# Patient Record
Sex: Female | Born: 1940 | Race: White | Hispanic: No | Smoking: Current every day smoker
Health system: Southern US, Community
[De-identification: ages and names within clinical notes are randomized; demographics above are authoritative.]

## PROBLEM LIST (undated history)

## (undated) DIAGNOSIS — K219 Gastro-esophageal reflux disease without esophagitis: Secondary | ICD-10-CM

## (undated) DIAGNOSIS — I739 Peripheral vascular disease, unspecified: Secondary | ICD-10-CM

## (undated) DIAGNOSIS — M359 Systemic involvement of connective tissue, unspecified: Secondary | ICD-10-CM

## (undated) DIAGNOSIS — I10 Essential (primary) hypertension: Secondary | ICD-10-CM

## (undated) DIAGNOSIS — D689 Coagulation defect, unspecified: Secondary | ICD-10-CM

## (undated) DIAGNOSIS — I824Z9 Acute embolism and thrombosis of unspecified deep veins of unspecified distal lower extremity: Secondary | ICD-10-CM

## (undated) DIAGNOSIS — C159 Malignant neoplasm of esophagus, unspecified: Secondary | ICD-10-CM

## (undated) DIAGNOSIS — J45909 Unspecified asthma, uncomplicated: Secondary | ICD-10-CM

## (undated) DIAGNOSIS — Z872 Personal history of diseases of the skin and subcutaneous tissue: Secondary | ICD-10-CM

## (undated) DIAGNOSIS — C109 Malignant neoplasm of oropharynx, unspecified: Secondary | ICD-10-CM

## (undated) DIAGNOSIS — Z8719 Personal history of other diseases of the digestive system: Secondary | ICD-10-CM

## (undated) DIAGNOSIS — E871 Hypo-osmolality and hyponatremia: Secondary | ICD-10-CM

## (undated) DIAGNOSIS — Z8711 Personal history of peptic ulcer disease: Secondary | ICD-10-CM

## (undated) DIAGNOSIS — D751 Secondary polycythemia: Secondary | ICD-10-CM

## (undated) DIAGNOSIS — N189 Chronic kidney disease, unspecified: Secondary | ICD-10-CM

## (undated) HISTORY — PX: BREAST CYST EXCISION: SHX579

## (undated) HISTORY — DX: Malignant neoplasm of oropharynx, unspecified: C10.9

## (undated) HISTORY — PX: TONSILLECTOMY: SUR1361

## (undated) HISTORY — PX: CATARACT EXTRACTION W/ INTRAOCULAR LENS  IMPLANT, BILATERAL: SHX1307

## (undated) HISTORY — DX: Acute embolism and thrombosis of unspecified deep veins of unspecified distal lower extremity: I82.4Z9

## (undated) HISTORY — DX: Secondary polycythemia: D75.1

## (undated) HISTORY — DX: Peripheral vascular disease, unspecified: I73.9

## (undated) HISTORY — PX: ANTERIOR CERVICAL DECOMP/DISCECTOMY FUSION: SHX1161

## (undated) HISTORY — DX: Chronic kidney disease, unspecified: N18.9

## (undated) HISTORY — DX: Essential (primary) hypertension: I10

## (undated) HISTORY — DX: Personal history of diseases of the skin and subcutaneous tissue: Z87.2

## (undated) HISTORY — PX: PERIPHERAL ARTERIAL STENT GRAFT: SHX2220

## (undated) HISTORY — DX: Malignant neoplasm of esophagus, unspecified: C15.9

## (undated) HISTORY — DX: Unspecified asthma, uncomplicated: J45.909

## (undated) HISTORY — DX: Coagulation defect, unspecified: D68.9

---

## 2004-08-22 ENCOUNTER — Ambulatory Visit: Payer: Self-pay | Admitting: Internal Medicine

## 2005-03-27 ENCOUNTER — Ambulatory Visit: Payer: Self-pay | Admitting: Physician Assistant

## 2005-04-25 ENCOUNTER — Inpatient Hospital Stay (HOSPITAL_COMMUNITY): Admission: RE | Admit: 2005-04-25 | Discharge: 2005-04-26 | Payer: Self-pay | Admitting: Neurosurgery

## 2005-05-15 ENCOUNTER — Encounter: Admission: RE | Admit: 2005-05-15 | Discharge: 2005-05-15 | Payer: Self-pay | Admitting: Neurosurgery

## 2005-08-12 ENCOUNTER — Ambulatory Visit: Payer: Self-pay | Admitting: Ophthalmology

## 2005-10-15 ENCOUNTER — Ambulatory Visit: Payer: Self-pay | Admitting: Internal Medicine

## 2006-05-23 ENCOUNTER — Ambulatory Visit: Payer: Self-pay | Admitting: Neurosurgery

## 2006-09-02 DIAGNOSIS — C109 Malignant neoplasm of oropharynx, unspecified: Secondary | ICD-10-CM

## 2006-09-02 DIAGNOSIS — C159 Malignant neoplasm of esophagus, unspecified: Secondary | ICD-10-CM

## 2006-09-02 HISTORY — DX: Malignant neoplasm of oropharynx, unspecified: C10.9

## 2006-09-02 HISTORY — PX: PORTACATH PLACEMENT: SHX2246

## 2006-09-02 HISTORY — DX: Malignant neoplasm of esophagus, unspecified: C15.9

## 2006-10-30 ENCOUNTER — Ambulatory Visit: Payer: Self-pay | Admitting: Internal Medicine

## 2007-07-04 ENCOUNTER — Ambulatory Visit: Payer: Self-pay | Admitting: Internal Medicine

## 2007-07-13 ENCOUNTER — Ambulatory Visit: Payer: Self-pay | Admitting: Family Medicine

## 2007-07-22 ENCOUNTER — Ambulatory Visit: Payer: Self-pay | Admitting: Internal Medicine

## 2007-07-23 ENCOUNTER — Ambulatory Visit: Payer: Self-pay | Admitting: Internal Medicine

## 2007-07-24 ENCOUNTER — Ambulatory Visit: Payer: Self-pay | Admitting: Vascular Surgery

## 2007-08-03 ENCOUNTER — Ambulatory Visit: Payer: Self-pay | Admitting: Internal Medicine

## 2007-09-03 ENCOUNTER — Ambulatory Visit: Payer: Self-pay | Admitting: Internal Medicine

## 2007-10-04 ENCOUNTER — Ambulatory Visit: Payer: Self-pay | Admitting: Internal Medicine

## 2007-11-01 ENCOUNTER — Ambulatory Visit: Payer: Self-pay | Admitting: Internal Medicine

## 2007-12-02 ENCOUNTER — Ambulatory Visit: Payer: Self-pay | Admitting: Internal Medicine

## 2008-01-01 ENCOUNTER — Ambulatory Visit: Payer: Self-pay | Admitting: Internal Medicine

## 2008-02-01 ENCOUNTER — Ambulatory Visit: Payer: Self-pay | Admitting: Internal Medicine

## 2008-03-02 ENCOUNTER — Ambulatory Visit: Payer: Self-pay | Admitting: Internal Medicine

## 2008-04-02 ENCOUNTER — Ambulatory Visit: Payer: Self-pay | Admitting: Internal Medicine

## 2008-04-04 ENCOUNTER — Ambulatory Visit: Payer: Self-pay | Admitting: Internal Medicine

## 2008-05-03 ENCOUNTER — Ambulatory Visit: Payer: Self-pay | Admitting: Internal Medicine

## 2008-06-02 ENCOUNTER — Ambulatory Visit: Payer: Self-pay | Admitting: Internal Medicine

## 2008-07-03 ENCOUNTER — Ambulatory Visit: Payer: Self-pay | Admitting: Internal Medicine

## 2008-07-18 ENCOUNTER — Ambulatory Visit: Payer: Self-pay | Admitting: Internal Medicine

## 2008-08-02 ENCOUNTER — Ambulatory Visit: Payer: Self-pay | Admitting: Internal Medicine

## 2008-09-02 ENCOUNTER — Ambulatory Visit: Payer: Self-pay | Admitting: Internal Medicine

## 2008-10-03 ENCOUNTER — Ambulatory Visit: Payer: Self-pay | Admitting: Internal Medicine

## 2008-10-31 ENCOUNTER — Ambulatory Visit: Payer: Self-pay | Admitting: Internal Medicine

## 2008-12-01 ENCOUNTER — Ambulatory Visit: Payer: Self-pay | Admitting: Internal Medicine

## 2008-12-31 ENCOUNTER — Ambulatory Visit: Payer: Self-pay | Admitting: Internal Medicine

## 2009-01-31 ENCOUNTER — Ambulatory Visit: Payer: Self-pay | Admitting: Internal Medicine

## 2009-03-02 ENCOUNTER — Ambulatory Visit: Payer: Self-pay | Admitting: Internal Medicine

## 2009-03-07 ENCOUNTER — Ambulatory Visit: Payer: Self-pay | Admitting: Internal Medicine

## 2009-04-02 ENCOUNTER — Ambulatory Visit: Payer: Self-pay | Admitting: Internal Medicine

## 2009-05-03 ENCOUNTER — Ambulatory Visit: Payer: Self-pay | Admitting: Internal Medicine

## 2009-05-31 ENCOUNTER — Ambulatory Visit: Payer: Self-pay | Admitting: Internal Medicine

## 2009-06-02 ENCOUNTER — Ambulatory Visit: Payer: Self-pay | Admitting: Internal Medicine

## 2009-06-12 ENCOUNTER — Ambulatory Visit: Payer: Self-pay | Admitting: Internal Medicine

## 2009-06-26 ENCOUNTER — Ambulatory Visit: Payer: Self-pay | Admitting: Vascular Surgery

## 2009-07-03 ENCOUNTER — Ambulatory Visit: Payer: Self-pay | Admitting: Internal Medicine

## 2009-08-02 ENCOUNTER — Ambulatory Visit: Payer: Self-pay | Admitting: Internal Medicine

## 2009-10-11 ENCOUNTER — Ambulatory Visit: Payer: Self-pay | Admitting: Internal Medicine

## 2009-11-07 ENCOUNTER — Ambulatory Visit: Payer: Self-pay | Admitting: Vascular Surgery

## 2009-12-31 ENCOUNTER — Ambulatory Visit: Payer: Self-pay | Admitting: Internal Medicine

## 2010-01-19 ENCOUNTER — Ambulatory Visit: Payer: Self-pay | Admitting: Internal Medicine

## 2010-01-31 ENCOUNTER — Ambulatory Visit: Payer: Self-pay | Admitting: Internal Medicine

## 2010-03-02 ENCOUNTER — Ambulatory Visit: Payer: Self-pay | Admitting: Internal Medicine

## 2010-04-02 ENCOUNTER — Ambulatory Visit: Payer: Self-pay | Admitting: Internal Medicine

## 2010-05-03 ENCOUNTER — Ambulatory Visit: Payer: Self-pay | Admitting: Internal Medicine

## 2010-06-02 ENCOUNTER — Ambulatory Visit: Payer: Self-pay | Admitting: Internal Medicine

## 2010-07-03 ENCOUNTER — Ambulatory Visit: Payer: Self-pay | Admitting: Internal Medicine

## 2010-08-02 ENCOUNTER — Ambulatory Visit: Payer: Self-pay | Admitting: Internal Medicine

## 2010-09-14 ENCOUNTER — Ambulatory Visit: Payer: Self-pay | Admitting: Internal Medicine

## 2010-10-03 ENCOUNTER — Ambulatory Visit: Payer: Self-pay | Admitting: Internal Medicine

## 2010-11-08 ENCOUNTER — Ambulatory Visit: Payer: Self-pay | Admitting: Internal Medicine

## 2010-11-09 ENCOUNTER — Ambulatory Visit: Payer: Self-pay | Admitting: Internal Medicine

## 2010-12-02 ENCOUNTER — Ambulatory Visit: Payer: Self-pay | Admitting: Internal Medicine

## 2011-01-11 ENCOUNTER — Ambulatory Visit: Payer: Self-pay | Admitting: Internal Medicine

## 2011-02-01 ENCOUNTER — Ambulatory Visit: Payer: Self-pay | Admitting: Internal Medicine

## 2011-02-28 ENCOUNTER — Ambulatory Visit: Payer: Self-pay | Admitting: Gastroenterology

## 2011-03-07 LAB — PATHOLOGY REPORT

## 2011-03-21 ENCOUNTER — Ambulatory Visit: Payer: Self-pay | Admitting: Otolaryngology

## 2011-05-13 ENCOUNTER — Ambulatory Visit: Payer: Self-pay | Admitting: Internal Medicine

## 2011-06-28 ENCOUNTER — Ambulatory Visit: Payer: Self-pay | Admitting: Internal Medicine

## 2011-07-04 ENCOUNTER — Ambulatory Visit: Payer: Self-pay | Admitting: Internal Medicine

## 2011-12-03 ENCOUNTER — Ambulatory Visit: Payer: Self-pay | Admitting: Internal Medicine

## 2011-12-30 ENCOUNTER — Ambulatory Visit: Payer: Self-pay | Admitting: Gastroenterology

## 2012-02-04 ENCOUNTER — Ambulatory Visit: Payer: Self-pay | Admitting: Gastroenterology

## 2012-02-07 ENCOUNTER — Ambulatory Visit: Payer: Self-pay | Admitting: Internal Medicine

## 2012-02-07 LAB — HEPATIC FUNCTION PANEL A (ARMC)
Albumin: 4 g/dL (ref 3.4–5.0)
Alkaline Phosphatase: 100 U/L (ref 50–136)
Bilirubin, Direct: 0.2 mg/dL (ref 0.00–0.20)
Bilirubin,Total: 0.7 mg/dL (ref 0.2–1.0)
SGOT(AST): 23 U/L (ref 15–37)
SGPT (ALT): 22 U/L

## 2012-02-07 LAB — BASIC METABOLIC PANEL
Anion Gap: 11 (ref 7–16)
BUN: 8 mg/dL (ref 7–18)
Calcium, Total: 9.8 mg/dL (ref 8.5–10.1)
Creatinine: 0.77 mg/dL (ref 0.60–1.30)
EGFR (African American): >60
Glucose: 114 mg/dL — ABNORMAL HIGH (ref 65–99)

## 2012-02-07 LAB — CBC CANCER CENTER
Eosinophil #: 0.1 x10 3/mm (ref 0.0–0.7)
Eosinophil %: 0.9 %
HCT: 51.6 % — ABNORMAL HIGH (ref 35.0–47.0)
Lymphocyte #: 1.2 x10 3/mm (ref 1.0–3.6)
MCH: 34.5 pg — ABNORMAL HIGH (ref 26.0–34.0)
MCHC: 33.5 g/dL (ref 32.0–36.0)
MCV: 103 fL — ABNORMAL HIGH (ref 80–100)
Neutrophil #: 4.7 x10 3/mm (ref 1.4–6.5)
Neutrophil %: 70.6 %
RDW: 14.3 % (ref 11.5–14.5)
WBC: 6.7 x10 3/mm (ref 3.6–11.0)

## 2012-02-11 ENCOUNTER — Ambulatory Visit: Payer: Self-pay | Admitting: Gastroenterology

## 2012-02-12 LAB — CANCER CENTER HEMATOCRIT: HCT: 50.4 % — ABNORMAL HIGH (ref 35.0–47.0)

## 2012-02-12 LAB — POTASSIUM: Potassium: 2.9 mmol/L — ABNORMAL LOW (ref 3.5–5.1)

## 2012-03-02 ENCOUNTER — Ambulatory Visit: Payer: Self-pay | Admitting: Internal Medicine

## 2012-03-11 LAB — POTASSIUM: Potassium: 3.4 mmol/L — ABNORMAL LOW (ref 3.5–5.1)

## 2012-03-11 LAB — CANCER CENTER HEMATOCRIT: HCT: 43.8 % (ref 35.0–47.0)

## 2012-03-25 LAB — CANCER CENTER HEMATOCRIT: HCT: 42.9 % (ref 35.0–47.0)

## 2012-04-02 ENCOUNTER — Ambulatory Visit: Payer: Self-pay | Admitting: Internal Medicine

## 2012-04-08 LAB — CANCER CENTER HEMATOCRIT: HCT: 44.3 % (ref 35.0–47.0)

## 2012-04-15 LAB — CANCER CENTER HEMATOCRIT: HCT: 43.2 % (ref 35.0–47.0)

## 2012-04-29 LAB — CREATININE, SERUM
Creatinine: 0.98 mg/dL (ref 0.60–1.30)
EGFR (African American): >60

## 2012-04-29 LAB — CBC CANCER CENTER
Basophil %: 0.7 %
Eosinophil #: 0.1 x10 3/mm (ref 0.0–0.7)
Eosinophil %: 1.1 %
HCT: 45.2 % (ref 35.0–47.0)
HGB: 14.5 g/dL (ref 12.0–16.0)
Lymphocyte #: 1.2 x10 3/mm (ref 1.0–3.6)
Lymphocyte %: 12.2 %
MCHC: 32.1 g/dL (ref 32.0–36.0)
MCV: 100 fL (ref 80–100)
Monocyte %: 9.1 %
Neutrophil #: 7.4 x10 3/mm — ABNORMAL HIGH (ref 1.4–6.5)
RBC: 4.52 10*6/uL (ref 3.80–5.20)
RDW: 15.9 % — ABNORMAL HIGH (ref 11.5–14.5)

## 2012-05-03 ENCOUNTER — Ambulatory Visit: Payer: Self-pay | Admitting: Internal Medicine

## 2012-05-07 ENCOUNTER — Encounter: Payer: Self-pay | Admitting: Cardiovascular Disease

## 2012-05-08 ENCOUNTER — Encounter: Payer: Self-pay | Admitting: Cardiovascular Disease

## 2012-05-08 ENCOUNTER — Ambulatory Visit (INDEPENDENT_AMBULATORY_CARE_PROVIDER_SITE_OTHER): Payer: Medicare Other | Admitting: Cardiovascular Disease

## 2012-05-08 VITALS — BP 150/60 | HR 71 | Ht 62.0 in | Wt 111.5 lb

## 2012-05-08 DIAGNOSIS — I10 Essential (primary) hypertension: Secondary | ICD-10-CM

## 2012-05-08 DIAGNOSIS — I739 Peripheral vascular disease, unspecified: Secondary | ICD-10-CM | POA: Insufficient documentation

## 2012-05-08 DIAGNOSIS — R0602 Shortness of breath: Secondary | ICD-10-CM | POA: Insufficient documentation

## 2012-05-08 NOTE — Progress Notes (Signed)
HPI  This is a 71 year old female who is here today for evaluation of dyspnea lower extremity edema. She is not aware of any previous cardiac history. 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 Earnestine Leys. There is no history of myocardial infarction, congestive heart failure or stroke. Since July, he started experiencing lower extremity edema which is worse on the left side. She also noted increased dyspnea especially in the morning with no orthopnea or PND. She still smokes 4-5 cigarettes a day. She smoked for 50 years but according to her she was never a heavy smoker. She denies any chest pain or discomfort. There is no palpitations, dizziness or syncope. She had recurrent lower extremity edema in the past and is almost always worse on the left side. She reports having a venous ultrasound in the lower extremity which showed no DVT. She also had been on amlodipine for hypertension and did notice that the swelling in her legs got worse after she was started on this medication. She reports bilateral calf discomfort with walking but current symptoms are much better than he for femoral stenting in 2010. She has not followed up with vascular surgery since then.  Allergies  Allergen Reactions  . Ceftin (Cefuroxime Axetil)   . Trimethobenzamide      Current Outpatient Prescriptions on File Prior to Visit  Medication Sig Dispense Refill  . amLODipine (NORVASC) 10 MG tablet Take 10 mg by mouth daily.      Marland Kitchen aspirin EC 81 MG tablet Take 81 mg by mouth daily.      . calcium carbonate (OS-CAL) 600 MG TABS Take 600 mg by mouth 3 (three) times daily.      . hydrochlorothiazide (HYDRODIURIL) 25 MG tablet Take 25 mg by mouth daily.      Marland Kitchen ipratropium (ATROVENT) 0.06 % nasal spray Place  2 sprays into the nose as needed.       . metoprolol succinate (TOPROL-XL) 25 MG 24 hr tablet Take 25 mg by mouth daily.      . pantoprazole (PROTONIX) 40 MG tablet Take 40 mg by mouth daily.      . potassium chloride SA (K-DUR,KLOR-CON) 20 MEQ tablet Take 20 mEq by mouth daily.         Past Medical History  Diagnosis Date  . Asthma   . Osteoporosis   . COPD (chronic obstructive pulmonary disease)   . Peripheral vascular disease   . Patient on combined chemotherapy and radiation 2008  . Hypertension   . History of skin ulcer   . Squamous cell carcinoma of oropharynx     right  . Cancer of esophagus      Past Surgical History  Procedure Date  . Breast biopsy     negative  . Peripheral arterial stent graft     x2 STENTS BOTH LEGS  . Neck surgery      History reviewed. No pertinent family history.   History   Social History  . Marital Status: Married    Spouse Name: N/A    Number of Children: N/A  . Years of Education: N/A   Occupational History  . Not on file.   Social History Main Topics  . Smoking status: Current Everyday Smoker -- 0.2 packs/day for 50 years    Types: Cigarettes  .  Smokeless tobacco: Not on file  . Alcohol Use: 0.6 oz/week    1 Glasses of wine per week  . Drug Use: No  . Sexually Active:    Other Topics Concern  . Not on file   Social History Narrative  . No narrative on file     ROS Constitutional: Negative for fever, chills, diaphoresis, activity change, appetite change and fatigue.  HENT: Negative for hearing loss, nosebleeds, congestion, sore throat, facial swelling, drooling, trouble swallowing, neck pain, voice change, sinus pressure and tinnitus.  Eyes: Negative for photophobia, pain, discharge and visual disturbance.  Respiratory: Negative for apnea, cough, chest tightness, shortness of breath and wheezing.  Cardiovascular: Negative for chest pain, palpitations .  Gastrointestinal: Negative for nausea, vomiting, abdominal  pain, diarrhea, constipation, blood in stool and abdominal distention.  Genitourinary: Negative for dysuria, urgency, frequency, hematuria and decreased urine volume.  Musculoskeletal: Negative for myalgias, back pain, joint swelling, arthralgias and gait problem.  Skin: Negative for color change, pallor, rash and wound.  Neurological: Negative for dizziness, tremors, seizures, syncope, speech difficulty, weakness, light-headedness, numbness and headaches.  Psychiatric/Behavioral: Negative for suicidal ideas, hallucinations, behavioral problems and agitation. The patient is not nervous/anxious.     PHYSICAL EXAM   BP 150/60  Pulse 71  Ht 5\' 2"  (1.575 m)  Wt 111 lb 8 oz (50.576 kg)  BMI 20.39 kg/m2 Constitutional: She is oriented to person, place, and time. She appears well-developed and well-nourished. No distress.  HENT: No nasal discharge.  Head: Normocephalic and atraumatic.  Eyes: Pupils are equal and round. Right eye exhibits no discharge. Left eye exhibits no discharge.  Neck: Normal range of motion. Neck supple. No JVD present. No thyromegaly present.  Cardiovascular: Normal rate, regular rhythm, normal heart sounds. Exam reveals no gallop and no friction rub. There is a 1/6 systolic ejection murmur at the aortic area.  Pulmonary/Chest: Effort normal and breath sounds normal. No stridor. No respiratory distress. She has no wheezes. She has no rales. She exhibits no tenderness.  Abdominal: Soft. Bowel sounds are normal. She exhibits no distension. There is no tenderness. There is no rebound and no guarding.  Musculoskeletal: Normal range of motion. She exhibits +1 edema worse on the left side and no tenderness.  Neurological: She is alert and oriented to person, place, and time. Coordination normal.  Skin: Skin is warm and dry. No rash noted. She is not diaphoretic. No erythema. No pallor.  Psychiatric: She has a normal mood and affect. Her behavior is normal. Judgment and thought  content normal.     EKG: Sinus  Rhythm  -Incomplete right bundle branch block and left axis -anterior fascicular block.   -  Nonspecific T-abnormality.    ASSESSMENT AND PLAN

## 2012-05-08 NOTE — Patient Instructions (Addendum)
Your physician has requested that you have a lexiscan myoview. For further information please visit [email or web address] Please follow instruction sheet, as given.  Your physician has requested that you have an echocardiogram. Echocardiography is a painless test that uses sound waves to create images of your heart. It provides your doctor with information about the size and shape of your heart and how well your heart's chambers and valves are working. This procedure takes approximately one hour. There are no restrictions for this procedure.  Your physician has requested that you have a lower extremity arterial exercise duplex. During this test, exercise and ultrasound are used to evaluate arterial blood flow in the legs. Allow one hour for this exam. There are no restrictions or special instructions.  Follow up after tests.

## 2012-05-08 NOTE — Assessment & Plan Note (Signed)
The patient had  previous femoral stenting done in 2010 with no followup since then. Current symptoms include mild claudication bilaterally. I recommend lower extremity arterial duplex ultrasound with ABI which will be ordered.

## 2012-05-08 NOTE — Assessment & Plan Note (Signed)
The patient is having increased dyspnea since July. She does have known history of COPD and she continues to smoke. It is possible that her dyspnea might be related to this. However, she had previous radiation and chemotherapy done and thus she is at risk of cardiomyopathy. She also has multiple risk factors for coronary artery disease. Although, she is not reporting chest pain, angina equivalent will need to be evaluated. Thus, I recommend evaluation with an echocardiogram as well as a pharmacologic nuclear stress test. The patient is not able to exercise on a treadmill and also had an abnormal ECG.

## 2012-05-08 NOTE — Assessment & Plan Note (Signed)
The patient is having lower extremity edema which might be related to chronic venous insufficiency, a cardiac etiology or treatment with amlodipine. If her cardiac workup is unremarkable, I will consider switching amlodipine to a different antihypertensive medication.

## 2012-05-18 ENCOUNTER — Other Ambulatory Visit (INDEPENDENT_AMBULATORY_CARE_PROVIDER_SITE_OTHER): Payer: Medicare Other

## 2012-05-18 ENCOUNTER — Other Ambulatory Visit: Payer: Self-pay

## 2012-05-18 DIAGNOSIS — R0602 Shortness of breath: Secondary | ICD-10-CM

## 2012-05-22 ENCOUNTER — Ambulatory Visit: Payer: Self-pay | Admitting: Cardiovascular Disease

## 2012-05-22 ENCOUNTER — Telehealth: Payer: Self-pay

## 2012-05-22 DIAGNOSIS — R0602 Shortness of breath: Secondary | ICD-10-CM

## 2012-05-22 NOTE — Telephone Encounter (Signed)
Message copied by Marcelle Overlie on Fri May 22, 2012  4:30 PM ------      Message from: Lorine Bears A      Created: Fri May 22, 2012  4:17 PM      Regarding: test       Normal stress test today.

## 2012-05-26 ENCOUNTER — Encounter (INDEPENDENT_AMBULATORY_CARE_PROVIDER_SITE_OTHER): Payer: Medicare Other

## 2012-05-26 ENCOUNTER — Other Ambulatory Visit: Payer: Self-pay | Admitting: Cardiovascular Disease

## 2012-05-26 ENCOUNTER — Other Ambulatory Visit: Payer: Self-pay | Admitting: Cardiology

## 2012-05-26 DIAGNOSIS — I739 Peripheral vascular disease, unspecified: Secondary | ICD-10-CM

## 2012-05-26 DIAGNOSIS — R0602 Shortness of breath: Secondary | ICD-10-CM

## 2012-05-26 DIAGNOSIS — I700 Atherosclerosis of aorta: Secondary | ICD-10-CM

## 2012-05-27 NOTE — Telephone Encounter (Signed)
Patient notified per Dr. Kirke Corin stress test is normal.

## 2012-06-01 ENCOUNTER — Ambulatory Visit (INDEPENDENT_AMBULATORY_CARE_PROVIDER_SITE_OTHER): Payer: Medicare Other | Admitting: Cardiovascular Disease

## 2012-06-01 ENCOUNTER — Encounter: Payer: Self-pay | Admitting: Cardiovascular Disease

## 2012-06-01 VITALS — BP 140/62 | HR 80 | Ht 62.0 in | Wt 109.2 lb

## 2012-06-01 DIAGNOSIS — I10 Essential (primary) hypertension: Secondary | ICD-10-CM

## 2012-06-01 DIAGNOSIS — R0602 Shortness of breath: Secondary | ICD-10-CM

## 2012-06-01 DIAGNOSIS — I739 Peripheral vascular disease, unspecified: Secondary | ICD-10-CM

## 2012-06-01 MED ORDER — LOSARTAN POTASSIUM-HCTZ 100-25 MG PO TABS: 1.0000 | ORAL_TABLET | Freq: Every day | ORAL | Status: DC

## 2012-06-01 NOTE — Assessment & Plan Note (Signed)
Status post iliac stenting in 2010. She had an ABI done which was moderately reduced on the right side. Aortoiliac duplex showed patent stents without significant restenosis. I will continue to monitor these on a yearly basis.

## 2012-06-01 NOTE — Patient Instructions (Addendum)
Stop Amlodipine.  Stop Hydrochlorothiazide.  Start Hyzaar (Losartan- Hydrochlorothiazide) 100/25 mg once daily.   Labs in 1 week.  Follow up in 6 months.

## 2012-06-01 NOTE — Assessment & Plan Note (Signed)
Her blood pressure is reasonably controlled. However, she is having increased lower extremity edema which is likely due to amlodipine. Thus, I will switch her from amlodipine to losartan. Given that she is already on hydrochlorothiazide, I will switch that  to Hyzaar 100/25 mg once daily. I will check basic metabolic profile in one week. If potassium level is reasonable, K-Dur can likely be discontinued.

## 2012-06-01 NOTE — Assessment & Plan Note (Signed)
This does not seem to be cardiac. It's likely due to COPD. She underwent cardiac evaluation which included a nuclear stress test which showed no evidence of ischemia with normal ejection fraction. She had an echocardiogram done which showed normal LV systolic function, mild grade 1 diastolic dysfunction, mild mitral regurgitation and borderline pulmonary hypertension. I advised the patient to quit smoking.

## 2012-06-01 NOTE — Progress Notes (Signed)
HPI  This is a 71 year old female who is here today for a followup visit regarding dyspnea and lower extremity edema. She is not aware of any previous cardiac history. 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 Earnestine Leys. There is no history of myocardial infarction, congestive heart failure or stroke. Since July, he started experiencing lower extremity edema which is worse on the left side. She also noted increased dyspnea especially in the morning with no orthopnea or PND. She still smokes 4-5 cigarettes a day. She smoked for 50 years but according to her she was never a heavy smoker. She denies any chest pain or discomfort. There is no palpitations, dizziness or syncope. She had recurrent lower extremity edema in the past and is almost always worse on the left side. She reports having a venous ultrasound in the lower extremity which showed no DVT. She also had been on amlodipine for hypertension and did notice that the swelling in her legs got worse after she was started on this medication.  She reports bilateral calf discomfort with walking but current symptoms are much better than he for femoral stenting in 2010.    Allergies  Allergen Reactions  . Ceftin (Cefuroxime Axetil)   . Trimethobenzamide      Current Outpatient Prescriptions on File Prior to Visit  Medication Sig Dispense Refill  . aspirin EC 81 MG tablet Take 81 mg by mouth daily.      . calcium carbonate (OS-CAL) 600 MG TABS Take 600 mg by mouth 3 (three) times daily.      . Glucosamine-Chondroit-Vit C-Mn (GLUCOSAMINE CHONDR 1500 COMPLX PO) Take by mouth daily.      Marland Kitchen ipratropium (ATROVENT) 0.06 % nasal spray Place 2 sprays into the nose as needed.       . metoprolol succinate (TOPROL-XL) 25 MG 24 hr tablet  Take 25 mg by mouth daily.      . Multiple Vitamin (MULTIVITAMIN) tablet Take 1 tablet by mouth daily.      . pantoprazole (PROTONIX) 40 MG tablet Take 40 mg by mouth daily.      . potassium chloride SA (K-DUR,KLOR-CON) 20 MEQ tablet Take 20 mEq by mouth daily.      Marland Kitchen losartan-hydrochlorothiazide (HYZAAR) 100-25 MG per tablet Take 1 tablet by mouth daily.  30 tablet  6     Past Medical History  Diagnosis Date  . Asthma   . Osteoporosis   . COPD (chronic obstructive pulmonary disease)   . Peripheral vascular disease   . Patient on combined chemotherapy and radiation 2008  . History of skin ulcer   . Squamous cell carcinoma of oropharynx     right  . Cancer of esophagus   . Hypertension      Past Surgical History  Procedure Date  . Breast biopsy     negative  . Peripheral arterial stent graft     x2 STENTS BOTH LEGS  . Neck surgery      History reviewed. No pertinent family history.   History   Social History  . Marital Status: Married    Spouse Name: N/A    Number of Children: N/A  . Years of Education: N/A   Occupational History  . Not on file.   Social History Main Topics  . Smoking status: Current  Every Day Smoker -- 0.2 packs/day for 50 years    Types: Cigarettes  . Smokeless tobacco: Not on file  . Alcohol Use: 0.6 oz/week    1 Glasses of wine per week  . Drug Use: No  . Sexually Active:    Other Topics Concern  . Not on file   Social History Narrative  . No narrative on file      PHYSICAL EXAM   BP 140/62  Pulse 80  Ht 5\' 2"  (1.575 m)  Wt 109 lb 4 oz (49.555 kg)  BMI 19.98 kg/m2 Constitutional: She is oriented to person, place, and time. She appears well-developed and well-nourished. No distress.  HENT: No nasal discharge.  Head: Normocephalic and atraumatic.  Eyes: Pupils are equal and round. Right eye exhibits no discharge. Left eye exhibits no discharge.  Neck: Normal range of motion. Neck supple. No JVD present. No thyromegaly  present.  Cardiovascular: Normal rate, regular rhythm, normal heart sounds. Exam reveals no gallop and no friction rub. No murmur heard.  Pulmonary/Chest: Effort normal and breath sounds normal. No stridor. No respiratory distress. She has no wheezes. She has no rales. She exhibits no tenderness.  Abdominal: Soft. Bowel sounds are normal. She exhibits no distension. There is no tenderness. There is no rebound and no guarding.  Musculoskeletal: Normal range of motion. She exhibits +1 edema and no tenderness.  Neurological: She is alert and oriented to person, place, and time. Coordination normal.  Skin: Skin is warm and dry. No rash noted. She is not diaphoretic. No erythema. No pallor.  Psychiatric: She has a normal mood and affect. Her behavior is normal. Judgment and thought content normal.      ASSESSMENT AND PLAN

## 2012-06-08 ENCOUNTER — Ambulatory Visit (INDEPENDENT_AMBULATORY_CARE_PROVIDER_SITE_OTHER): Payer: Medicare Other

## 2012-06-08 DIAGNOSIS — I10 Essential (primary) hypertension: Secondary | ICD-10-CM

## 2012-06-09 LAB — BASIC METABOLIC PANEL
BUN: 13 mg/dL (ref 8–27)
Glucose: 100 mg/dL — ABNORMAL HIGH (ref 65–99)

## 2012-06-15 ENCOUNTER — Telehealth: Payer: Self-pay | Admitting: Cardiovascular Disease

## 2012-06-15 NOTE — Telephone Encounter (Signed)
Pt states her blood pressure medicine was changed on 06/01/12.  Pt states her blood pressure is going up gradually.  She thinks the change has something to do with it.  Please call pt to advise.

## 2012-06-16 ENCOUNTER — Other Ambulatory Visit: Payer: Self-pay

## 2012-06-16 NOTE — Telephone Encounter (Signed)
Pt says since med changes at last OV ( stopping amlodipine and adding Hyzaar) her BP has not been well controlled.  It has been as follows: 169/65, 194/71, 170's systolic. She says she has been so worried that she took amlodipine yesterday and today. BP today still 170 systolic.  I told her I would let Dr. Kirke Corin know, but advised her, until I am able to get a response, to try taking HCTZ 25 mg tonight (has this at home) then in am resume amlodipine 10 mg and HCTZ 25 mg qd.  She understands to hold Hyzaar. i will call her back with Dr. Jari Sportsman response. Understanding verb.

## 2012-06-16 NOTE — Telephone Encounter (Signed)
Continue Hyzaar. Resume Amlodipine but at a smaller dose (5 mg once daily).

## 2012-06-16 NOTE — Telephone Encounter (Signed)
LMTCB

## 2012-06-16 NOTE — Telephone Encounter (Signed)
Please see below and advise. thanks 

## 2012-06-17 NOTE — Telephone Encounter (Signed)
Pt informed Understanding verb 

## 2012-06-24 ENCOUNTER — Ambulatory Visit: Payer: Self-pay | Admitting: Internal Medicine

## 2012-06-29 ENCOUNTER — Telehealth: Payer: Self-pay | Admitting: Cardiovascular Disease

## 2012-06-29 NOTE — Telephone Encounter (Signed)
Called patient back. She states that she restarted the Norvasc at 5mg  every day and now she is having leg swelling, left greater than right, along with facial swelling. She is also currently taking Hyzaar100/25 and Metoprolol XL 25mg  every day. BP last night on all meds was 139/63. She did not record her heart rate. Advised to Stop the Norvasc and will discuss with Dr.Arida plan for BP control and call her back.

## 2012-06-29 NOTE — Telephone Encounter (Signed)
Discussed above with Dr.Arida and he advised that patient stay off Norvasc. Record BP and heart rates 2 times per day for the next 3 days and call back with the readings and he will made adjustments to her BP medication if necessary.

## 2012-06-29 NOTE — Telephone Encounter (Signed)
Pt calling due to a medication adjustment. Pt is having face and leg swelling.

## 2012-07-02 ENCOUNTER — Other Ambulatory Visit: Payer: Self-pay

## 2012-07-02 MED ORDER — CARVEDILOL 6.25 MG PO TABS: 6.2500 mg | ORAL_TABLET | Freq: Two times a day (BID) | ORAL | Status: DC

## 2012-07-02 NOTE — Telephone Encounter (Signed)
Pt informed Understanding verb New rx sent to pharm 

## 2012-07-02 NOTE — Telephone Encounter (Signed)
See BP readings below and advise thanks

## 2012-07-02 NOTE — Telephone Encounter (Signed)
Pt calling with BP readings  10/29 10:48 am 123/61 p 80 10/29 8:14 pm 173/57 p 69 10/29 11:35 pm 154/60 p69  10/30 10:56 am 188/69 p66 10/30 1:56 pm 186/71 p83 10/30 8:57 pm 181/71 p83 10/30 12:03 am 150/61 p69  10/31 9:43 am 184/75 p78 10/31 3:11 pm 162/64 p80

## 2012-07-02 NOTE — Telephone Encounter (Signed)
Stop Toprol. Start Coreg 6.25 mg bid. Continue to monitor BP.

## 2012-07-03 ENCOUNTER — Ambulatory Visit: Payer: Self-pay | Admitting: Internal Medicine

## 2012-07-03 NOTE — Telephone Encounter (Signed)
Please see below as to what I told pt

## 2012-07-03 NOTE — Telephone Encounter (Signed)
Pt calling states that new med she was prescribed says not to take if pt has asthma.pt does have asthma

## 2012-07-03 NOTE — Telephone Encounter (Signed)
I called pt and explained coreg is a betablocker, as was the Toprol. If pt tolerated Toprol ok, then she should be able to tolerate the coreg.  understanding verb.  She will let us know should she begin to notice worsening sob.

## 2012-07-05 NOTE — Telephone Encounter (Signed)
Great.  Thanks

## 2012-07-09 ENCOUNTER — Other Ambulatory Visit: Payer: Self-pay

## 2012-07-09 MED ORDER — DILTIAZEM HCL ER COATED BEADS 120 MG PO CP24: 120.0000 mg | ORAL_CAPSULE | Freq: Every day | ORAL | Status: DC

## 2012-07-09 NOTE — Telephone Encounter (Signed)
Pt started coreg at our request Bp has been as follows: 11/5 am=189/74, 81        Pm=134/56 11/6 am=162/65, 70         PM=137/58 This am=185/66  Held coreg this am d/t DOE "getting worse"; has had to use inhaler more than usual, feels this is d/t coreg I told her I would discuss with Dr. Kirke Corin and call her back Understanding verb. Continues to take Hyzaar as prescribed

## 2012-07-09 NOTE — Telephone Encounter (Signed)
Stop Coreg. Start Diltiazem ER 120 mg once daily.

## 2012-07-09 NOTE — Telephone Encounter (Signed)
Pt informed Understanding verb New RX sent to pharm  

## 2012-07-09 NOTE — Telephone Encounter (Signed)
Pt calling stating that she thinks her carvedilol is causing her to have SOB. Wants to speak to nurse

## 2012-07-20 NOTE — Telephone Encounter (Signed)
Pt calling states her pulse has been running high today. Wants to speak to nurse

## 2012-07-21 NOTE — Telephone Encounter (Signed)
Increase Diltilazem ER to 180 mg once daily.

## 2012-07-21 NOTE — Telephone Encounter (Signed)
141/61, HR=72 last night 151/66, 128/54, 158/72, 178/70, 171/65, 152/62, 158/64 HR yesterday stayed around 104 BPM Pt says she was asymptomatic Confirms compliance with diltiazem CD I explained I would inform Dr. Kirke Corin and will call her back Understanding verb

## 2012-07-22 ENCOUNTER — Other Ambulatory Visit: Payer: Self-pay

## 2012-07-22 MED ORDER — DILTIAZEM HCL ER COATED BEADS 180 MG PO CP24: 180.0000 mg | ORAL_CAPSULE | Freq: Every day | ORAL | Status: DC

## 2012-07-22 NOTE — Telephone Encounter (Signed)
Pt informed Understanding verb New RX sent to pharm  

## 2012-08-04 ENCOUNTER — Ambulatory Visit (INDEPENDENT_AMBULATORY_CARE_PROVIDER_SITE_OTHER): Payer: Medicare Other | Admitting: Cardiovascular Disease

## 2012-08-04 ENCOUNTER — Encounter: Payer: Self-pay | Admitting: Cardiovascular Disease

## 2012-08-04 VITALS — BP 138/72 | HR 65 | Ht 62.0 in | Wt 110.8 lb

## 2012-08-04 DIAGNOSIS — I10 Essential (primary) hypertension: Secondary | ICD-10-CM

## 2012-08-04 DIAGNOSIS — I739 Peripheral vascular disease, unspecified: Secondary | ICD-10-CM

## 2012-08-04 DIAGNOSIS — R0602 Shortness of breath: Secondary | ICD-10-CM

## 2012-08-04 MED ORDER — DILTIAZEM HCL ER COATED BEADS 240 MG PO CP24: 240.0000 mg | ORAL_CAPSULE | Freq: Every day | ORAL | Status: DC

## 2012-08-04 NOTE — Progress Notes (Signed)
HPI  This is a 71 year old female who is here today for a followup visit regarding hypertension. She has no previous cardiac history. 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 Earnestine Leys. Since July, he started experiencing lower extremity edema which was worse on the left side associated with increased dyspnea especially in the morning with no orthopnea or PND.  She underwent cardiac evaluation which included a nuclear stress test which showed no evidence of ischemia. Echocardiogram showed normal LV systolic function with mild diastolic dysfunction and borderline pulmonary hypertension. It was felt that her dyspnea was due to COPD. Lower extremity edema was thought to be due to amlodipine which was discontinued and she was switched to Hyzaar. The patient called our office subsequently with elevated blood pressure on maximum dose Hyzaar. Amlodipine was resumed but at a lower dose. However, she again did not tolerate it due to lower extremity edema. Amlodipine was stopped and I decided to switch metoprolol to carvedilol for better blood pressure control. However, she did not tolerate the medication due to worsening dyspnea. I decided to start her on diltiazem extended release which thus far has tolerated. Blood pressure has improved and overall she feels better. Home blood pressure readings are still elevated nonetheless.  Allergies  Allergen Reactions  . Ceftin (Cefuroxime Axetil)   . Trimethobenzamide      Current Outpatient Prescriptions on File Prior to Visit  Medication Sig Dispense Refill  . aspirin EC 81 MG tablet Take 81 mg by mouth daily.      . calcium carbonate (OS-CAL) 600 MG TABS Take 600 mg by mouth 3 (three) times daily.      . Glucosamine-Chondroit-Vit C-Mn  (GLUCOSAMINE CHONDR 1500 COMPLX PO) Take by mouth daily.      Marland Kitchen ipratropium (ATROVENT) 0.06 % nasal spray Place 2 sprays into the nose as needed.       Marland Kitchen losartan-hydrochlorothiazide (HYZAAR) 100-25 MG per tablet Take 1 tablet by mouth daily.  30 tablet  6  . Multiple Vitamin (MULTIVITAMIN) tablet Take 1 tablet by mouth daily.      . pantoprazole (PROTONIX) 40 MG tablet Take 40 mg by mouth daily.         Past Medical History  Diagnosis Date  . Asthma   . Osteoporosis   . COPD (chronic obstructive pulmonary disease)   . Peripheral vascular disease   . Patient on combined chemotherapy and radiation 2008  . History of skin ulcer   . Squamous cell carcinoma of oropharynx     right  . Cancer of esophagus   . Hypertension      Past Surgical History  Procedure Date  . Breast biopsy     negative  . Peripheral arterial stent graft     x2 STENTS BOTH LEGS  . Neck surgery      History reviewed. No pertinent family history.   History   Social History  . Marital Status: Married    Spouse Name: N/A    Number of Children: N/A  . Years of Education: N/A   Occupational History  . Not on file.   Social History Main Topics  . Smoking status: Current Every Day Smoker -- 0.2 packs/day for 50 years    Types: Cigarettes  . Smokeless tobacco: Not on file  . Alcohol  Use: 0.6 oz/week    1 Glasses of wine per week  . Drug Use: No  . Sexually Active:    Other Topics Concern  . Not on file   Social History Narrative  . No narrative on file      PHYSICAL EXAM   BP 138/72  Pulse 65  Ht 5\' 2"  (1.575 m)  Wt 110 lb 12 oz (50.236 kg)  BMI 20.26 kg/m2 Constitutional: She is oriented to person, place, and time. She appears well-developed and well-nourished. No distress.  HENT: No nasal discharge.  Head: Normocephalic and atraumatic.  Eyes: Pupils are equal and round. Right eye exhibits no discharge. Left eye exhibits no discharge.  Neck: Normal range of motion. Neck supple. No  JVD present. No thyromegaly present.  Cardiovascular: Normal rate, regular rhythm, normal heart sounds. Exam reveals no gallop and no friction rub. No murmur heard.  Pulmonary/Chest: Effort normal and breath sounds normal. No stridor. No respiratory distress. She has no wheezes. She has no rales. She exhibits no tenderness.  Abdominal: Soft. Bowel sounds are normal. She exhibits no distension. There is no tenderness. There is no rebound and no guarding.  Musculoskeletal: Normal range of motion. She exhibits +1 edema and no tenderness.  Neurological: She is alert and oriented to person, place, and time. Coordination normal.  Skin: Skin is warm and dry. No rash noted. She is not diaphoretic. No erythema. No pallor.  Psychiatric: She has a normal mood and affect. Her behavior is normal. Judgment and thought content normal.   YNW:GNFAO  Rhythm  -Right atrial enlargement.   -Poor R-wave progression.   -  Nonspecific T-abnormality.   Right atrial abnormality and rotation -possible pulmonary disease.   ABNORMAL    ASSESSMENT AND PLAN

## 2012-08-04 NOTE — Assessment & Plan Note (Signed)
Status post iliac stenting in 2010. She had an ABI done which was moderately reduced on the right side. Aortoiliac duplex showed patent stents without significant restenosis. Previous angiogram showed moderate SFA disease. I will continue to monitor these on a yearly basis.

## 2012-08-04 NOTE — Assessment & Plan Note (Signed)
The patient has intolerance to multiple medications including amlodipine and carvedilol. She is tolerating Hyzaar and diltiazem but blood pressure is still above target. I checked her blood pressure manually in both arms and it was 158/70. Thus, I will increase the dose of diltiazem 240 mg once daily. If blood pressure remains elevated, I will consider adding a small dose spironolactone as long as there are no issues with hyperkalemia.

## 2012-08-04 NOTE — Patient Instructions (Addendum)
Increase Diltiazem ER to 240 mg once daily.  Follow up in 3 months.

## 2012-08-19 ENCOUNTER — Ambulatory Visit: Payer: Self-pay | Admitting: Internal Medicine

## 2012-08-19 LAB — CANCER CENTER HEMATOCRIT: HCT: 44.1 % (ref 35.0–47.0)

## 2012-08-20 ENCOUNTER — Ambulatory Visit: Payer: Self-pay | Admitting: Neurosurgery

## 2012-09-02 ENCOUNTER — Ambulatory Visit: Payer: Self-pay | Admitting: Internal Medicine

## 2012-09-10 ENCOUNTER — Telehealth: Payer: Self-pay | Admitting: Cardiovascular Disease

## 2012-09-10 ENCOUNTER — Other Ambulatory Visit: Payer: Self-pay

## 2012-09-10 DIAGNOSIS — I10 Essential (primary) hypertension: Secondary | ICD-10-CM

## 2012-09-10 MED ORDER — SPIRONOLACTONE 25 MG PO TABS: 25.0000 mg | ORAL_TABLET | Freq: Every day | ORAL | Status: DC

## 2012-09-10 NOTE — Telephone Encounter (Signed)
Start Spironolactone 25 mg once daily.  Check BMP in 5 days.

## 2012-09-10 NOTE — Telephone Encounter (Signed)
Pt calling stating that her BP is running from 170-190/60-90. Call home # 1st then cell

## 2012-09-10 NOTE — Telephone Encounter (Signed)
Please see below and advise what dose spironolactone (if that is still what you want to do) thanks

## 2012-09-10 NOTE — Telephone Encounter (Signed)
Pt says her blood pressure has remained elevated since Christmas.  Stays around 170-180 systolic. This am it was 195/71.   She confirms compliance with meds as prescribed I explained, per Dr. Jari Sportsman last note, his next step was going to be to add spironolactone.  I advised her to start this and come in to office next week for BMP Understanding verb appt made for next Wednesday for BMP at 2 pm

## 2012-09-15 ENCOUNTER — Ambulatory Visit (INDEPENDENT_AMBULATORY_CARE_PROVIDER_SITE_OTHER): Payer: Medicare Other

## 2012-09-15 VITALS — BP 154/60 | Ht 62.0 in | Wt 110.0 lb

## 2012-09-15 DIAGNOSIS — I10 Essential (primary) hypertension: Secondary | ICD-10-CM

## 2012-09-15 NOTE — Progress Notes (Signed)
Pt was started on spironolactone 1/9 for high BP She is here today for BMP after new med start She brings BP log with her: 09/12/12-172/67 09/12/12-168/61 09/13/12-172/72 09/13/12-196/64 09/13/12-179/62 09/14/12-172/66 09/14/12-143/60 09/14/12-142/60 09/15/12-173/69  I advised pt to continue meds as prescribed and we will call her with BMP results and further instructions re: BP if BP still elevated Understanding verb

## 2012-09-16 ENCOUNTER — Other Ambulatory Visit: Payer: Self-pay

## 2012-09-16 ENCOUNTER — Telehealth: Payer: Self-pay

## 2012-09-16 DIAGNOSIS — I10 Essential (primary) hypertension: Secondary | ICD-10-CM

## 2012-09-16 LAB — BASIC METABOLIC PANEL
Calcium: 9.9 mg/dL (ref 8.6–10.2)
Creatinine, Ser: 1.26 mg/dL — ABNORMAL HIGH (ref 0.57–1.00)
GFR calc Af Amer: 49 mL/min/{1.73_m2} — ABNORMAL LOW (ref >59)
GFR calc non Af Amer: 42 mL/min/{1.73_m2} — ABNORMAL LOW (ref >59)
Glucose: 112 mg/dL — ABNORMAL HIGH (ref 65–99)
Potassium: 4.5 mmol/L (ref 3.5–5.2)

## 2012-09-16 NOTE — Telephone Encounter (Signed)
She might have renal artery stenosis.  Schedule her for renal artery Duplex.

## 2012-09-16 NOTE — Telephone Encounter (Signed)
I called pt to give her BMP results She says BP remains elevated despite addition of spironolactone 151-182 systolic I advised her to continue present regimen until I can talk with Dr. Kirke Corin about what to do next Understanding verb

## 2012-09-17 ENCOUNTER — Other Ambulatory Visit: Payer: Self-pay

## 2012-09-17 DIAGNOSIS — I10 Essential (primary) hypertension: Secondary | ICD-10-CM

## 2012-09-17 NOTE — Telephone Encounter (Signed)
ok 

## 2012-09-17 NOTE — Telephone Encounter (Signed)
Pt informed Tried to schedule for 1/23 but pt has scheduling conflict Scheduled for 1/30 at 0900 She will still come in for labs 1/23  She wanted to remind Dr. Kirke Corin she took hyzaar in past and it "stopped working"  This is the med she is taking now I explained Dr. Kirke Corin is probably aware of this but I will remind him

## 2012-09-22 ENCOUNTER — Other Ambulatory Visit: Payer: Self-pay

## 2012-09-22 ENCOUNTER — Telehealth: Payer: Self-pay

## 2012-09-22 MED ORDER — HYDRALAZINE HCL 25 MG PO TABS: 25.0000 mg | ORAL_TABLET | Freq: Three times a day (TID) | ORAL | Status: DC

## 2012-09-22 NOTE — Telephone Encounter (Signed)
Pt informed Understanding verb New RX sent to pharmacy

## 2012-09-22 NOTE — Telephone Encounter (Signed)
Message copied by [facility], Sharlisa Hollifield E on Tue Sep 22, 2012  2:25 PM ------      Message from: Lorine Bears A      Created: Mon Sep 21, 2012  7:50 PM       Start Hydralazine 25 mg tid.             ----- Message -----         From: Marcelle Overlie, RN         Sent: 09/21/2012  11:12 AM           To: Iran Ouch, MD                        ----- Message -----         From: Antonieta Iba, MD         Sent: 09/21/2012  11:06 AM           To: Iran Ouch, MD, Marcelle Overlie, RN            Sent to me,      Kirke Corin patient. Will forward      thx       tim            Marcelle Overlie, RN  09/15/2012  3:17 PM  Pended      Pt was started on spironolactone 1/9 for high BP      She is here today for BMP after new med start      She brings BP log with her:      09/12/12-172/67      09/12/12-168/61      09/13/12-172/72      09/13/12-196/64      09/13/12-179/62      09/14/12-172/66      09/14/12-143/60      09/14/12-142/60      09/15/12-173/69                  I advised pt to continue meds as prescribed and we will call her with BMP results and further instructions re: BP if BP still elevated      Understanding verb                        ----- Message -----         From: Marcelle Overlie, RN         Sent: 09/15/2012   3:08 PM           To: Antonieta Iba, MD

## 2012-09-23 ENCOUNTER — Telehealth: Payer: Self-pay

## 2012-09-23 NOTE — Telephone Encounter (Signed)
[facility] calling to say pt there for PT. Says she was doing her last arm exercise when pt told PT she was feeling dizzy and "everything was looking bright" She then felt as if she was going to pass out Therapist laid pt down and elevated legs and feet BP=112/48, 104/48, HR=66 Denies sob or CP Pt started hydralazine as instructed yesterday  Therapist let me speak to pt Pt says she took hydralazine as prescribed this am and again at 2 pm I advised her to hold this medication She doe snot have to take any more meds this evening I told her to change positions slowly and stay well-hydrated I will let Dr. Kirke Corin know and will call her back at [phone] with further instructions Understanding verb

## 2012-09-23 NOTE — Telephone Encounter (Signed)
Hold Hydralazine for now and monitor BP.

## 2012-09-24 ENCOUNTER — Ambulatory Visit (INDEPENDENT_AMBULATORY_CARE_PROVIDER_SITE_OTHER): Payer: Medicare Other

## 2012-09-24 VITALS — BP 148/60

## 2012-09-24 DIAGNOSIS — I10 Essential (primary) hypertension: Secondary | ICD-10-CM

## 2012-09-24 NOTE — Telephone Encounter (Signed)
Pt informed Understanding verb 

## 2012-09-24 NOTE — Progress Notes (Signed)
Pt brought BP readings and was here for BMP I also took BP while in office BPs are as follows: 170/64 172/61 179/75 157/70 170/71 114/48-hydralazine 104/48-hydralazine  Holding hydralazine 145/56 172/59 170/68 140/65 148/60 today Will have Dr. Kirke Corin review Will await BMP results as well

## 2012-09-25 ENCOUNTER — Other Ambulatory Visit: Payer: Self-pay

## 2012-09-25 ENCOUNTER — Other Ambulatory Visit: Payer: Self-pay | Admitting: Neurosurgery

## 2012-09-25 DIAGNOSIS — I10 Essential (primary) hypertension: Secondary | ICD-10-CM

## 2012-09-25 LAB — BASIC METABOLIC PANEL
BUN: 25 mg/dL (ref 8–27)
Calcium: 10 mg/dL (ref 8.6–10.2)
Creatinine, Ser: 1.82 mg/dL — ABNORMAL HIGH (ref 0.57–1.00)
GFR calc Af Amer: 31 mL/min/{1.73_m2} — ABNORMAL LOW (ref >59)
GFR calc non Af Amer: 27 mL/min/{1.73_m2} — ABNORMAL LOW (ref >59)
Glucose: 126 mg/dL — ABNORMAL HIGH (ref 65–99)
Potassium: 4.4 mmol/L (ref 3.5–5.2)

## 2012-09-25 MED ORDER — LOSARTAN POTASSIUM 100 MG PO TABS: 100.0000 mg | ORAL_TABLET | Freq: Every day | ORAL | Status: DC

## 2012-10-01 ENCOUNTER — Ambulatory Visit (INDEPENDENT_AMBULATORY_CARE_PROVIDER_SITE_OTHER): Payer: Medicare Other

## 2012-10-01 ENCOUNTER — Telehealth: Payer: Self-pay | Admitting: *Deleted

## 2012-10-01 ENCOUNTER — Encounter (INDEPENDENT_AMBULATORY_CARE_PROVIDER_SITE_OTHER): Payer: Medicare Other

## 2012-10-01 DIAGNOSIS — I10 Essential (primary) hypertension: Secondary | ICD-10-CM

## 2012-10-01 NOTE — Telephone Encounter (Signed)
DATE:  TIME:  BP:  HR: 09/24/12 10:23 AM 140/65  104   2:15 PM 148/60        11:20 PM 170/56  76 09/25/2012 7:36 AM 171/65  90 09/26/2012 11:10 AM 165/77  100   6:11 PM 172/65  86   12:16 AM 166/62  79 09/27/12 10:13 AM 168/73  95   5:55 PM 162/63  87   11:15 PM 170/74  82 09/28/2012 9:20 AM 171/61  73   2:42 PM 167/63  73   10:58 PM 157/62  81 09/29/2012 9:20 AM 171/61  73   1:45 PM 159/56  97   9:30 PM 182/65  87   11:11 AM 165/56  74 09/30/12 9:28 AM 169/66  79   9:07 PM 191/73  86 10/01/12 8:06 AM 174/66  87

## 2012-10-02 ENCOUNTER — Encounter (HOSPITAL_COMMUNITY): Payer: Self-pay

## 2012-10-02 ENCOUNTER — Encounter (HOSPITAL_COMMUNITY)
Admission: RE | Admit: 2012-10-02 | Discharge: 2012-10-02 | Disposition: A | Discharge status: Home / Self Care | Payer: Medicare Other | Source: Ambulatory Visit | Attending: Neurosurgery | Admitting: Neurosurgery

## 2012-10-02 ENCOUNTER — Encounter (HOSPITAL_COMMUNITY)
Admission: RE | Admit: 2012-10-02 | Discharge: 2012-10-02 | Disposition: A | Discharge status: Home / Self Care | Payer: Medicare Other | Source: Ambulatory Visit | Attending: Anesthesiology | Admitting: Anesthesiology

## 2012-10-02 HISTORY — DX: Gastro-esophageal reflux disease without esophagitis: K21.9

## 2012-10-02 LAB — BASIC METABOLIC PANEL
BUN/Creatinine Ratio: 12 (ref 11–26)
CO2: 19 mmol/L (ref 19–28)
Creatinine, Ser: 1.03 mg/dL — ABNORMAL HIGH (ref 0.57–1.00)
GFR calc non Af Amer: 54 mL/min/{1.73_m2} — ABNORMAL LOW (ref >59)
Potassium: 4.9 mmol/L (ref 3.5–5.2)
Sodium: 137 mmol/L (ref 134–144)

## 2012-10-02 LAB — CBC
HCT: 40 % (ref 36.0–46.0)
Hemoglobin: 14.3 g/dL (ref 12.0–15.0)
MCH: 32.5 pg (ref 26.0–34.0)
MCHC: 35.8 g/dL (ref 30.0–36.0)

## 2012-10-02 NOTE — Pre-Procedure Instructions (Signed)
DARLA MCDONALD  10/02/2012   Your procedure is scheduled on:  Wednesday, February 5  Report to [facility] at 0630 AM.  Call this number if you have problems the morning of surgery: [phone]   Remember:   Do not eat food or drink liquids after midnight.Tueday night   Take these medicines the morning of surgery with A SIP OF WATER: Diltiazem,Hydralazine,Hydrocodone,Pantoprazole   Do not wear jewelry, make-up or nail polish.  Do not wear lotions, powders, or perfumes. You may not wear deodorant.  Do not shave 48 hours prior to surgery.    Do not bring valuables to the hospital.  Contacts, dentures or bridgework may not be worn into surgery.  Leave suitcase in the car. After surgery it may be brought to your room.  For patients admitted to the hospital, checkout time is 11:00 AM the day of  discharge.   Patients discharged the day of surgery will not be allowed to drive  home.  Name and phone number of your driver:   Special Instructions: Shower using CHG 2 nights before surgery and the night before surgery.  If you shower the day of surgery use CHG.  Use special wash - you have one bottle of CHG for all showers.  You should use approximately 1/3 of the bottle for each shower.   Please read over the following fact sheets that you were given: Pain Booklet, Coughing and Deep Breathing, MRSA Information and Surgical Site Infection Prevention

## 2012-10-05 ENCOUNTER — Telehealth: Payer: Self-pay

## 2012-10-05 ENCOUNTER — Other Ambulatory Visit: Payer: Self-pay

## 2012-10-05 MED ORDER — HYDRALAZINE HCL 25 MG PO TABS: 50.0000 mg | ORAL_TABLET | Freq: Three times a day (TID) | ORAL | Status: DC

## 2012-10-05 NOTE — Telephone Encounter (Signed)
She is already on Diltiazem and thus can not add Amlodipine which is also a calcium channel blocker.  Increase Hydralazine to 100 mg Q 8 hours.

## 2012-10-05 NOTE — Telephone Encounter (Signed)
I called pt to give her BMP results She verb understanding but is still c./o high BP since med changes She confirms compliance with meds as prescribed Says SBP last night  Was 203 This am BP=182/69, HR=88 prior to meds Took meds at 0900 this am She says losartan is not helping and she asks me to tell Dr. Kirke Corin she "needs something else" Says she has taken amlodipine in past and this caused edema, but she says iot helped her BP, she states, "I would rather have swelling than high BP" She is scheduled for orthopedic neck surg this Wednesday. I am concerned about her BP during surg. I told her I would have to discuss with Dr. Kirke Corin and call her back Understanding verb still awaiting renal u/s results

## 2012-10-05 NOTE — Telephone Encounter (Signed)
Pt informed I will fax letter to Dr. Lovell Sheehan (surgeon) as well I offered appt with Dr. Kirke Corin for 1 week, but she says she has had this type of neck surg in past and they told her to not ride or drive a car until 2 week post op visit with surgeon I discussed with Dr. Kirke Corin who says ok to postpone f/u with him 2 weeks/until she is released by surgeons to ride in car Scheduled pt for appt with Dr. Kirke Corin for 2/21 Pt verb. understanding

## 2012-10-05 NOTE — Telephone Encounter (Signed)
She can have the surgery if BP is less than 160/90.  Renal artery ultrasound showed blockages in both arteries worse on the right side. Schedule her for a follow up visit with me in 1 week. If her BP remains elevated, she will need renal angiography and possible stent.

## 2012-10-05 NOTE — Telephone Encounter (Signed)
Pt asks if ok to proceed with neck surg as scheduled Wednesday She is also asking for Renal artery duplex results I told her I would have Dr. Kirke Corin review and call her back Understanding verb

## 2012-10-05 NOTE — Telephone Encounter (Signed)
Results placed on your desk for review. 

## 2012-10-05 NOTE — Telephone Encounter (Signed)
Pt informed Understanding verb 

## 2012-10-06 MED ORDER — VANCOMYCIN HCL IN DEXTROSE 1-5 GM/200ML-% IV SOLN
1000.0000 mg | INTRAVENOUS | Status: AC
  Administered 2012-10-07: 1000 mg via INTRAVENOUS
  Filled 2012-10-06: qty 200

## 2012-10-07 ENCOUNTER — Inpatient Hospital Stay (HOSPITAL_COMMUNITY)
Admission: RE | Admit: 2012-10-07 | Discharge: 2012-10-08 | DRG: 472 | Disposition: A | Discharge status: Home / Self Care | Payer: Medicare Other | Source: Ambulatory Visit | Attending: Neurosurgery | Admitting: Neurosurgery

## 2012-10-07 ENCOUNTER — Encounter (HOSPITAL_COMMUNITY)
Admission: RE | Disposition: A | Discharge status: Home / Self Care | Payer: Self-pay | Source: Ambulatory Visit | Attending: Neurosurgery

## 2012-10-07 ENCOUNTER — Encounter (HOSPITAL_COMMUNITY): Payer: Self-pay | Admitting: Anesthesiology

## 2012-10-07 ENCOUNTER — Inpatient Hospital Stay (HOSPITAL_COMMUNITY): Payer: Medicare Other | Admitting: Anesthesiology

## 2012-10-07 ENCOUNTER — Encounter (HOSPITAL_COMMUNITY): Payer: Self-pay | Admitting: *Deleted

## 2012-10-07 ENCOUNTER — Inpatient Hospital Stay (HOSPITAL_COMMUNITY): Payer: Medicare Other

## 2012-10-07 DIAGNOSIS — F17200 Nicotine dependence, unspecified, uncomplicated: Secondary | ICD-10-CM | POA: Diagnosis present

## 2012-10-07 DIAGNOSIS — J449 Chronic obstructive pulmonary disease, unspecified: Secondary | ICD-10-CM | POA: Diagnosis present

## 2012-10-07 DIAGNOSIS — Z8501 Personal history of malignant neoplasm of esophagus: Secondary | ICD-10-CM

## 2012-10-07 DIAGNOSIS — I739 Peripheral vascular disease, unspecified: Secondary | ICD-10-CM | POA: Diagnosis present

## 2012-10-07 DIAGNOSIS — Z01818 Encounter for other preprocedural examination: Secondary | ICD-10-CM

## 2012-10-07 DIAGNOSIS — J4489 Other specified chronic obstructive pulmonary disease: Secondary | ICD-10-CM | POA: Diagnosis present

## 2012-10-07 DIAGNOSIS — K219 Gastro-esophageal reflux disease without esophagitis: Secondary | ICD-10-CM | POA: Diagnosis present

## 2012-10-07 DIAGNOSIS — Z01812 Encounter for preprocedural laboratory examination: Secondary | ICD-10-CM

## 2012-10-07 DIAGNOSIS — I10 Essential (primary) hypertension: Secondary | ICD-10-CM | POA: Diagnosis present

## 2012-10-07 DIAGNOSIS — M4712 Other spondylosis with myelopathy, cervical region: Principal | ICD-10-CM | POA: Diagnosis present

## 2012-10-07 DIAGNOSIS — Z7982 Long term (current) use of aspirin: Secondary | ICD-10-CM

## 2012-10-07 DIAGNOSIS — M810 Age-related osteoporosis without current pathological fracture: Secondary | ICD-10-CM | POA: Diagnosis present

## 2012-10-07 DIAGNOSIS — M5000 Cervical disc disorder with myelopathy, unspecified cervical region: Secondary | ICD-10-CM | POA: Diagnosis present

## 2012-10-07 DIAGNOSIS — M4722 Other spondylosis with radiculopathy, cervical region: Secondary | ICD-10-CM

## 2012-10-07 HISTORY — PX: ANTERIOR CERVICAL DECOMP/DISCECTOMY FUSION: SHX1161

## 2012-10-07 SURGERY — ANTERIOR CERVICAL DECOMPRESSION/DISCECTOMY FUSION 2 LEVEL/HARDWARE REMOVAL
Anesthesia: General | Site: Neck | Wound class: Clean

## 2012-10-07 MED ORDER — EPHEDRINE SULFATE 50 MG/ML IJ SOLN
INTRAMUSCULAR | Status: DC | PRN
  Administered 2012-10-07 (×2): 10 mg via INTRAVENOUS
  Administered 2012-10-07: 5 mg via INTRAVENOUS

## 2012-10-07 MED ORDER — DEXAMETHASONE SODIUM PHOSPHATE 4 MG/ML IJ SOLN
INTRAMUSCULAR | Status: DC | PRN
  Administered 2012-10-07: 8 mg via INTRAVENOUS

## 2012-10-07 MED ORDER — PANTOPRAZOLE SODIUM 40 MG PO TBEC
40.0000 mg | DELAYED_RELEASE_TABLET | Freq: Every day | ORAL | Status: DC
  Administered 2012-10-08: 40 mg via ORAL
  Filled 2012-10-07: qty 1

## 2012-10-07 MED ORDER — LIDOCAINE HCL (CARDIAC) 20 MG/ML IV SOLN
INTRAVENOUS | Status: DC | PRN
  Administered 2012-10-07: 100 mg via INTRAVENOUS

## 2012-10-07 MED ORDER — NEOSTIGMINE METHYLSULFATE 1 MG/ML IJ SOLN
INTRAMUSCULAR | Status: DC | PRN
  Administered 2012-10-07: 3 mg via INTRAVENOUS

## 2012-10-07 MED ORDER — DEXAMETHASONE SODIUM PHOSPHATE 4 MG/ML IJ SOLN
4.0000 mg | Freq: Four times a day (QID) | INTRAMUSCULAR | Status: AC
  Administered 2012-10-07 – 2012-10-08 (×2): 4 mg via INTRAVENOUS
  Filled 2012-10-07 (×2): qty 1

## 2012-10-07 MED ORDER — ACETAMINOPHEN 650 MG RE SUPP: 650.0000 mg | RECTAL | Status: DC | PRN

## 2012-10-07 MED ORDER — MIDAZOLAM HCL 5 MG/5ML IJ SOLN
INTRAMUSCULAR | Status: DC | PRN
  Administered 2012-10-07 (×2): 1 mg via INTRAVENOUS

## 2012-10-07 MED ORDER — ADULT MULTIVITAMIN W/MINERALS CH
1.0000 | ORAL_TABLET | Freq: Every day | ORAL | Status: DC
  Administered 2012-10-08: 1 via ORAL
  Filled 2012-10-07 (×2): qty 1

## 2012-10-07 MED ORDER — DOCUSATE SODIUM 100 MG PO CAPS
100.0000 mg | ORAL_CAPSULE | Freq: Two times a day (BID) | ORAL | Status: DC
  Administered 2012-10-07 – 2012-10-08 (×2): 100 mg via ORAL
  Filled 2012-10-07 (×2): qty 1

## 2012-10-07 MED ORDER — ACETAMINOPHEN 325 MG PO TABS: 650.0000 mg | ORAL_TABLET | ORAL | Status: DC | PRN

## 2012-10-07 MED ORDER — IPRATROPIUM-ALBUTEROL 18-103 MCG/ACT IN AERO
2.0000 | INHALATION_SPRAY | Freq: Four times a day (QID) | RESPIRATORY_TRACT | Status: DC | PRN
  Filled 2012-10-07: qty 14.7

## 2012-10-07 MED ORDER — METOCLOPRAMIDE HCL 5 MG/ML IJ SOLN: 10.0000 mg | Freq: Once | INTRAMUSCULAR | Status: DC | PRN

## 2012-10-07 MED ORDER — DEXAMETHASONE 4 MG PO TABS
4.0000 mg | ORAL_TABLET | Freq: Four times a day (QID) | ORAL | Status: AC
  Administered 2012-10-07: 4 mg via ORAL
  Filled 2012-10-07: qty 1

## 2012-10-07 MED ORDER — GLYCOPYRROLATE 0.2 MG/ML IJ SOLN
INTRAMUSCULAR | Status: DC | PRN
  Administered 2012-10-07: 0.4 mg via INTRAVENOUS

## 2012-10-07 MED ORDER — LACTATED RINGERS IV SOLN
INTRAVENOUS | Status: DC | PRN
  Administered 2012-10-07 (×2): via INTRAVENOUS

## 2012-10-07 MED ORDER — THROMBIN 5000 UNITS EX SOLR
CUTANEOUS | Status: DC | PRN
  Administered 2012-10-07 (×2): 5000 [IU] via TOPICAL

## 2012-10-07 MED ORDER — OXYCODONE HCL 5 MG PO TABS: 5.0000 mg | ORAL_TABLET | Freq: Once | ORAL | Status: DC | PRN

## 2012-10-07 MED ORDER — LACTATED RINGERS IV SOLN: INTRAVENOUS | Status: DC

## 2012-10-07 MED ORDER — HEMOSTATIC AGENTS (NO CHARGE) OPTIME
TOPICAL | Status: DC | PRN
  Administered 2012-10-07: 1 via TOPICAL

## 2012-10-07 MED ORDER — HYDROCODONE-ACETAMINOPHEN 5-325 MG PO TABS: 1.0000 | ORAL_TABLET | ORAL | Status: DC | PRN

## 2012-10-07 MED ORDER — MENTHOL 3 MG MT LOZG
1.0000 | LOZENGE | OROMUCOSAL | Status: DC | PRN
  Administered 2012-10-07: 3 mg via ORAL
  Filled 2012-10-07: qty 9

## 2012-10-07 MED ORDER — DILTIAZEM HCL ER COATED BEADS 240 MG PO CP24
240.0000 mg | ORAL_CAPSULE | Freq: Every day | ORAL | Status: DC
  Administered 2012-10-08: 240 mg via ORAL
  Filled 2012-10-07 (×2): qty 1

## 2012-10-07 MED ORDER — ONDANSETRON HCL 4 MG/2ML IJ SOLN: 4.0000 mg | INTRAMUSCULAR | Status: DC | PRN

## 2012-10-07 MED ORDER — PROPOFOL 10 MG/ML IV BOLUS
INTRAVENOUS | Status: DC | PRN
  Administered 2012-10-07: 20 mg via INTRAVENOUS
  Administered 2012-10-07: 150 mg via INTRAVENOUS

## 2012-10-07 MED ORDER — HYDROMORPHONE HCL PF 1 MG/ML IJ SOLN: 0.2500 mg | INTRAMUSCULAR | Status: DC | PRN

## 2012-10-07 MED ORDER — ONDANSETRON HCL 4 MG/2ML IJ SOLN
INTRAMUSCULAR | Status: DC | PRN
  Administered 2012-10-07: 4 mg via INTRAVENOUS

## 2012-10-07 MED ORDER — LOSARTAN POTASSIUM 50 MG PO TABS
100.0000 mg | ORAL_TABLET | Freq: Every day | ORAL | Status: DC
  Administered 2012-10-07 – 2012-10-08 (×2): 100 mg via ORAL
  Filled 2012-10-07 (×2): qty 2

## 2012-10-07 MED ORDER — ROCURONIUM BROMIDE 100 MG/10ML IV SOLN
INTRAVENOUS | Status: DC | PRN
  Administered 2012-10-07: 10 mg via INTRAVENOUS
  Administered 2012-10-07: 40 mg via INTRAVENOUS
  Administered 2012-10-07: 10 mg via INTRAVENOUS

## 2012-10-07 MED ORDER — VANCOMYCIN HCL 500 MG IV SOLR
500.0000 mg | Freq: Once | INTRAVENOUS | Status: AC
  Administered 2012-10-07: 500 mg via INTRAVENOUS
  Filled 2012-10-07: qty 500

## 2012-10-07 MED ORDER — HYDRALAZINE HCL 50 MG PO TABS
50.0000 mg | ORAL_TABLET | Freq: Three times a day (TID) | ORAL | Status: DC
  Administered 2012-10-07 – 2012-10-08 (×2): 50 mg via ORAL
  Filled 2012-10-07 (×5): qty 1

## 2012-10-07 MED ORDER — BACITRACIN 50000 UNITS IM SOLR
INTRAMUSCULAR | Status: AC
  Filled 2012-10-07: qty 1

## 2012-10-07 MED ORDER — DIAZEPAM 5 MG PO TABS
5.0000 mg | ORAL_TABLET | Freq: Four times a day (QID) | ORAL | Status: DC | PRN
Start: 2012-10-07 — End: 2012-10-08

## 2012-10-07 MED ORDER — OXYCODONE HCL 5 MG/5ML PO SOLN: 5.0000 mg | Freq: Once | ORAL | Status: DC | PRN

## 2012-10-07 MED ORDER — MORPHINE SULFATE 2 MG/ML IJ SOLN: 1.0000 mg | INTRAMUSCULAR | Status: DC | PRN

## 2012-10-07 MED ORDER — 0.9 % SODIUM CHLORIDE (POUR BTL) OPTIME
TOPICAL | Status: DC | PRN
  Administered 2012-10-07: 1000 mL

## 2012-10-07 MED ORDER — SODIUM CHLORIDE 0.9 % IV SOLN
INTRAVENOUS | Status: AC
  Filled 2012-10-07: qty 500

## 2012-10-07 MED ORDER — SODIUM CHLORIDE 0.9 % IR SOLN
Status: DC | PRN
  Administered 2012-10-07: 10:00:00

## 2012-10-07 MED ORDER — PHENOL 1.4 % MT LIQD: 1.0000 | OROMUCOSAL | Status: DC | PRN

## 2012-10-07 MED ORDER — ZOLPIDEM TARTRATE 5 MG PO TABS: 5.0000 mg | ORAL_TABLET | Freq: Every evening | ORAL | Status: DC | PRN

## 2012-10-07 MED ORDER — FENTANYL CITRATE 0.05 MG/ML IJ SOLN
INTRAMUSCULAR | Status: DC | PRN
  Administered 2012-10-07 (×2): 100 ug via INTRAVENOUS
  Administered 2012-10-07 (×5): 50 ug via INTRAVENOUS

## 2012-10-07 MED ORDER — OXYCODONE-ACETAMINOPHEN 5-325 MG PO TABS: 1.0000 | ORAL_TABLET | ORAL | Status: DC | PRN

## 2012-10-07 MED ORDER — ONE-DAILY MULTI VITAMINS PO TABS: 1.0000 | ORAL_TABLET | Freq: Every day | ORAL | Status: DC

## 2012-10-07 MED ORDER — BUPIVACAINE-EPINEPHRINE 0.5% -1:200000 IJ SOLN
INTRAMUSCULAR | Status: DC | PRN
  Administered 2012-10-07: 10 mL

## 2012-10-07 MED ORDER — SUCCINYLCHOLINE CHLORIDE 20 MG/ML IJ SOLN
INTRAMUSCULAR | Status: DC | PRN
  Administered 2012-10-07: 100 mg via INTRAVENOUS

## 2012-10-07 SURGICAL SUPPLY — 65 items
ADH SKN CLS LQ APL DERMABOND (GAUZE/BANDAGES/DRESSINGS) ×1
APL SKNCLS STERI-STRIP NONHPOA (GAUZE/BANDAGES/DRESSINGS)
BAG DECANTER FOR FLEXI CONT (MISCELLANEOUS) ×2 IMPLANT
BENZOIN TINCTURE PRP APPL 2/3 (GAUZE/BANDAGES/DRESSINGS) ×2 IMPLANT
BIT DRILL NEURO 2X3.1 SFT TUCH (MISCELLANEOUS) ×1 IMPLANT
BLADE SURG 15 STRL LF DISP TIS (BLADE) ×1 IMPLANT
BLADE SURG 15 STRL SS (BLADE)
BLADE ULTRA TIP 2M (BLADE) ×2 IMPLANT
BRUSH SCRUB EZ PLAIN DRY (MISCELLANEOUS) ×2 IMPLANT
BUR BARREL STRAIGHT FLUTE 4.0 (BURR) ×3 IMPLANT
CANISTER SUCTION 2500CC (MISCELLANEOUS) ×2 IMPLANT
CLOTH BEACON ORANGE TIMEOUT ST (SAFETY) ×2 IMPLANT
CONT SPEC 4OZ CLIKSEAL STRL BL (MISCELLANEOUS) ×2 IMPLANT
COVER MAYO STAND STRL (DRAPES) ×2 IMPLANT
DERMABOND ADHESIVE PROPEN (GAUZE/BANDAGES/DRESSINGS) ×1
DERMABOND ADVANCED .7 DNX6 (GAUZE/BANDAGES/DRESSINGS) IMPLANT
DEVICE FUSION VIST S 14X14X6MM (Trauma) IMPLANT
DRAPE LAPAROTOMY 100X72 PEDS (DRAPES) ×2 IMPLANT
DRAPE MICROSCOPE LEICA (MISCELLANEOUS) IMPLANT
DRAPE POUCH INSTRU U-SHP 10X18 (DRAPES) ×2 IMPLANT
DRAPE SURG 17X23 STRL (DRAPES) ×4 IMPLANT
DRILL NEURO 2X3.1 SOFT TOUCH (MISCELLANEOUS) ×2
ELECT REM PT RETURN 9FT ADLT (ELECTROSURGICAL) ×2
ELECTRODE REM PT RTRN 9FT ADLT (ELECTROSURGICAL) ×1 IMPLANT
GAUZE SPONGE 4X4 16PLY XRAY LF (GAUZE/BANDAGES/DRESSINGS) IMPLANT
GLOVE BIO SURGEON STRL SZ8.5 (GLOVE) ×2 IMPLANT
GLOVE BIOGEL PI IND STRL 7.0 (GLOVE) IMPLANT
GLOVE BIOGEL PI INDICATOR 7.0 (GLOVE) ×3
GLOVE EXAM NITRILE LRG STRL (GLOVE) IMPLANT
GLOVE EXAM NITRILE MD LF STRL (GLOVE) IMPLANT
GLOVE EXAM NITRILE XL STR (GLOVE) IMPLANT
GLOVE EXAM NITRILE XS STR PU (GLOVE) IMPLANT
GLOVE SS BIOGEL STRL SZ 6.5 (GLOVE) IMPLANT
GLOVE SS BIOGEL STRL SZ 8 (GLOVE) ×1 IMPLANT
GLOVE SUPERSENSE BIOGEL SZ 6.5 (GLOVE) ×3
GLOVE SUPERSENSE BIOGEL SZ 8 (GLOVE) ×1
GOWN BRE IMP SLV AUR LG STRL (GOWN DISPOSABLE) ×2 IMPLANT
GOWN BRE IMP SLV AUR XL STRL (GOWN DISPOSABLE) ×1 IMPLANT
KIT BASIN OR (CUSTOM PROCEDURE TRAY) ×2 IMPLANT
KIT ROOM TURNOVER OR (KITS) ×2 IMPLANT
MARKER SKIN DUAL TIP RULER LAB (MISCELLANEOUS) ×2 IMPLANT
NDL SPNL 18GX3.5 QUINCKE PK (NEEDLE) ×1 IMPLANT
NEEDLE HYPO 22GX1.5 SAFETY (NEEDLE) ×2 IMPLANT
NEEDLE SPNL 18GX3.5 QUINCKE PK (NEEDLE) ×2 IMPLANT
NS IRRIG 1000ML POUR BTL (IV SOLUTION) ×2 IMPLANT
PACK LAMINECTOMY NEURO (CUSTOM PROCEDURE TRAY) ×2 IMPLANT
PEEK VISTA 14X14X8MM (Peek) ×1 IMPLANT
PIN DISTRACTION 14MM (PIN) ×4 IMPLANT
PLATE ANT CERV XTEND 2 LV 32 (Plate) ×1 IMPLANT
PUTTY 5ML ACTIFUSE ABX (Putty) ×1 IMPLANT
RUBBERBAND STERILE (MISCELLANEOUS) IMPLANT
SCREW SELF TAP VARIABLE 4.6X12 (Screw) ×1 IMPLANT
SCREW XTD VAR 4.2 SELF TAP 12 (Screw) ×5 IMPLANT
SPONGE GAUZE 4X4 12PLY (GAUZE/BANDAGES/DRESSINGS) ×2 IMPLANT
SPONGE INTESTINAL PEANUT (DISPOSABLE) ×4 IMPLANT
SPONGE SURGIFOAM ABS GEL SZ50 (HEMOSTASIS) ×2 IMPLANT
STRIP CLOSURE SKIN 1/2X4 (GAUZE/BANDAGES/DRESSINGS) ×1 IMPLANT
SUT VIC AB 0 CT1 27 (SUTURE) ×2
SUT VIC AB 0 CT1 27XBRD ANTBC (SUTURE) ×1 IMPLANT
SUT VIC AB 3-0 SH 8-18 (SUTURE) ×2 IMPLANT
SYR 20ML ECCENTRIC (SYRINGE) ×2 IMPLANT
TOWEL OR 17X24 6PK STRL BLUE (TOWEL DISPOSABLE) ×2 IMPLANT
TOWEL OR 17X26 10 PK STRL BLUE (TOWEL DISPOSABLE) ×2 IMPLANT
VISTA S O 14X14X6MM (Trauma) ×2 IMPLANT
WATER STERILE IRR 1000ML POUR (IV SOLUTION) ×2 IMPLANT

## 2012-10-07 NOTE — Transfer of Care (Signed)
Immediate Anesthesia Transfer of Care Note  Patient: [facility]  Procedure(s) Performed: Procedure(s) (LRB) with comments: ANTERIOR CERVICAL DECOMPRESSION/DISCECTOMY FUSION 2 LEVEL/HARDWARE REMOVAL (N/A) - Cervical three-four, cervical four-five Anterior cervical decompression/diskectomy/fusion/interbody plating and prosthesis/removal of spinal concept plate  Patient Location: PACU  Anesthesia Type:General  Level of Consciousness: awake, oriented and patient cooperative  Airway & Oxygen Therapy: Patient Spontanous Breathing and Patient connected to nasal cannula oxygen  Post-op Assessment: Report given to PACU RN, Post -op Vital signs reviewed and stable and Patient moving all extremities X 4  Post vital signs: Reviewed and stable  Complications: No apparent anesthesia complications

## 2012-10-07 NOTE — Anesthesia Preprocedure Evaluation (Addendum)
Anesthesia Evaluation  Patient identified by MRN, date of birth, ID band Patient awake    Reviewed: Allergy & Precautions, H&P , NPO status , Patient's Chart, lab work & pertinent test results, reviewed documented beta blocker date and time   Airway Mallampati: II TM Distance: >3 FB Neck ROM: full    Dental  (+) Dental Advisory Given and Edentulous Lower   Pulmonary shortness of breath, asthma , COPD COPD inhaler, Current Smoker,  breath sounds clear to auscultation        Cardiovascular hypertension, + Peripheral Vascular Disease Rhythm:regular     Neuro/Psych negative neurological ROS  negative psych ROS   GI/Hepatic Neg liver ROS, GERD-  Medicated and Controlled,  Endo/Other  negative endocrine ROS  Renal/GU negative Renal ROS  negative genitourinary   Musculoskeletal   Abdominal   Peds  Hematology negative hematology ROS (+)   Anesthesia Other Findings See surgeon's H&P   Reproductive/Obstetrics negative OB ROS                          Anesthesia Physical Anesthesia Plan  ASA: III  Anesthesia Plan: General   Post-op Pain Management:    Induction: Intravenous  Airway Management Planned: Oral ETT  Additional Equipment:   Intra-op Plan:   Post-operative Plan: Extubation in OR  Informed Consent: I have reviewed the patients History and Physical, chart, labs and discussed the procedure including the risks, benefits and alternatives for the proposed anesthesia with the patient or authorized representative who has indicated his/her understanding and acceptance.   Dental Advisory Given  Plan Discussed with: CRNA and Surgeon  Anesthesia Plan Comments:         Anesthesia Quick Evaluation

## 2012-10-07 NOTE — Progress Notes (Signed)
Patient ID: Jennifer [facility], female   DOB: 1941/07/09, 72 y.o.   MRN: [id number] Subjective:  The patient is alert and pleasant. She looks well. She has no complaints.  Objective: Vital signs in last 24 hours: Temp:  [97.4 F (36.3 C)-98.6 F (37 C)] 97.7 F (36.5 C) (02/05 1320) Pulse Rate:  [64-77] 66  (02/05 1320) Resp:  [11-18] 18  (02/05 1320) BP: (135-159)/(44-64) 149/64 mmHg (02/05 1320) SpO2:  [92 %-98 %] 92 % (02/05 1320) Weight:  [48.988 kg (108 lb)] 48.988 kg (108 lb) (02/05 1344)  Intake/Output from previous day:   Intake/Output this shift: Total I/O In: 1780 [P.O.:480; I.V.:1300] Out: -   Physical exam the patient is alert and oriented. She moving all 4 extremities well. Her wound is without evidence of hematoma or shift.  Lab Results: No results found for this basename: WBC:2,HGB:2,HCT:2,PLT:2 in the last 72 hours BMET No results found for this basename: NA:2,K:2,CL:2,CO2:2,GLUCOSE:2,BUN:2,CREATININE:2,CALCIUM:2 in the last 72 hours  Studies/Results: Dg Cervical Spine 1 View  10/07/2012  *RADIOLOGY REPORT*  Clinical Data: C3-4 and C4-5 ACDF  DG CERVICAL SPINE - 1 VIEW  Comparison: 05/15/2005  Findings: Single lateral view shows anterior cervical discectomy and fusion from C3-C5.  Interbody fusion material is in place. There is an anterior plate with screw fixation.  Sponges remain along the operative approach.  Previously seen C5-C7 plate has been removed.  IMPRESSION: New ACDF from C3-C5.   Original Report Authenticated By: Paulina Fusi, M.D.     Assessment/Plan: The patient is doing well.  LOS: 0 days     Jaskirat Schwieger D 10/07/2012, 3:38 PM

## 2012-10-07 NOTE — Preoperative (Signed)
Beta Blockers   Reason not to administer Beta Blockers:Not Applicable 

## 2012-10-07 NOTE — Progress Notes (Signed)
Orthopedic Tech Progress Note Patient Details:  [facility] September 05, 1940 [id number]  Patient ID: [name], female   DOB: 11-02-1940, 72 y.o.   MRN: [id number]   [name] 10/07/2012, 1:49 PM [employer] COMPLETED BY [employer].

## 2012-10-07 NOTE — Plan of Care (Signed)
Problem: Consults Goal: Diagnosis - Spinal Surgery Outcome: Completed/Met Date Met:  10/07/12 Cervical Spine Fusion

## 2012-10-07 NOTE — H&P (Signed)
Subjective: The patient is a 72 year old white female who has previously undergone a C5-C6 and C6-7 anterior cervical discectomy fusion plating by another physician years ago. She has developed recurrent neck and radicular arm pain. She has failed medical management and was worked up with a cervical MRI. This demonstrated the patient had this degeneration and spondylosis at C3-4 and C4-5. I discussed the various treatment options with the patient including surgery. The patient has weighed the risks, benefits, and alternatives surgery and decided proceed with a C3-4 and C4-5 intracervical discectomy, fusion, and plating with removal of your plating from C5-C7.   Past Medical History  Diagnosis Date  . Asthma   . Osteoporosis   . COPD (chronic obstructive pulmonary disease)   . Peripheral vascular disease   . Patient on combined chemotherapy and radiation 2008  . History of skin ulcer   . Hypertension   . GERD (gastroesophageal reflux disease)   . 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 Cancer of esophagus 2008    s/p chemo and radiation    Past Surgical History  Procedure Date  . Breast biopsy     negative  . Peripheral arterial stent graft     x2 STENTS BOTH LEGS  . Neck surgery   . Portacath placement 2008    Allergies  Allergen Reactions  . Ceftin (Cefuroxime Axetil)   . Tequin (Gatifloxacin)   . Trimethobenzamide     History  Substance Use Topics  . Smoking status: Current Every Day Smoker -- 0.2 packs/day for 50 years    Types: Cigarettes  . Smokeless tobacco: Not on file  . Alcohol Use: Not on file    History reviewed. No pertinent family history. Prior to Admission medications   Medication Sig Start Date End Date Taking? Authorizing Provider  albuterol-ipratropium (COMBIVENT) 18-103 MCG/ACT inhaler Inhale 2 puffs into the lungs every 6 (six) hours as needed.   Yes Historical Provider, MD  aspirin EC 81 MG tablet Take 81 mg by mouth daily.   Yes  Historical Provider, MD  diltiazem (CARDIZEM CD) 240 MG 24 hr capsule Take 1 capsule (240 mg total) by mouth daily. 08/04/12  Yes Iran Ouch, MD  Glucosamine-Chondroit-Vit C-Mn (GLUCOSAMINE CHONDR 1500 COMPLX PO) Take 1 tablet by mouth 3 (three) times daily.    Yes Historical Provider, MD  hydrALAZINE (APRESOLINE) 25 MG tablet Take 2 tablets (50 mg total) by mouth 3 (three) times daily. 10/05/12  Yes Iran Ouch, MD  HYDROcodone-acetaminophen (NORCO) 10-325 MG per tablet Take 1 tablet by mouth every 6 (six) hours as needed. For pain   Yes Historical Provider, MD  ipratropium (ATROVENT) 0.06 % nasal spray Place 2 sprays into the nose daily as needed. For nasal symptoms   Yes Historical Provider, MD  losartan (COZAAR) 100 MG tablet Take 1 tablet (100 mg total) by mouth daily. 09/25/12  Yes Iran Ouch, MD  Multiple Vitamin (MULTIVITAMIN) tablet Take 1 tablet by mouth daily.   Yes Historical Provider, MD  OVER THE COUNTER MEDICATION Take 6 tablets by mouth daily. Equate brand of Fiber Therapy   Yes Historical Provider, MD  pantoprazole (PROTONIX) 40 MG tablet Take 40 mg by mouth daily.   Yes Historical Provider, MD     Review of Systems  Positive ROS: As above  All other systems have been reviewed and were otherwise negative with the exception of those mentioned in the HPI and as above.  Objective: Vital signs in  last 24 hours: Temp:  [97.4 F (36.3 C)] 97.4 F (36.3 C) (02/05 1610) Pulse Rate:  [77] 77  (02/05 0638) Resp:  [18] 18  (02/05 0638) BP: (159)/(60) 159/60 mmHg (02/05 0638) SpO2:  [96 %] 96 % (02/05 9604)  General Appearance: Alert, cooperative, no distress, appears stated age Head: Normocephalic, without obvious abnormality, atraumatic Eyes: PERRL, conjunctiva/corneas clear, EOM's intact, fundi benign, both eyes      Ears: Normal TM's and external ear canals, both ears Throat: Lips, mucosa, and tongue normal; teeth and gums normal Neck: Supple, symmetrical,  trachea midline, no adenopathy; thyroid: No enlargement/tenderness/nodules; no carotid bruit or JVD Back: Symmetric, no curvature, ROM normal, no CVA tenderness Lungs: Clear to auscultation bilaterally, respirations unlabored Heart: Regular rate and rhythm, S1 and S2 normal, no murmur, rub or gallop Abdomen: Soft, non-tender, bowel sounds active all four quadrants, no masses, no organomegaly Extremities: Extremities normal, atraumatic, no cyanosis or edema Pulses: 2+ and symmetric all extremities Skin: Skin color, texture, turgor normal, no rashes or lesions  NEUROLOGIC:   Mental status: alert and oriented, no aphasia, good attention span, Fund of knowledge/ memory ok Motor Exam - grossly normal Sensory Exam - grossly normal Reflexes:  Coordination - grossly normal Gait - grossly normal Balance - grossly normal Cranial Nerves: I: smell Not tested  II: visual acuity  OS: Normal    OD: Normal   II: visual fields Full to confrontation  II: pupils Equal, round, reactive to light  III,VII: ptosis None  III,IV,VI: extraocular muscles  Full ROM  V: mastication Normal  V: facial light touch sensation  Normal  V,VII: corneal reflex  Present  VII: facial muscle function - upper  Normal  VII: facial muscle function - lower Normal  VIII: hearing Not tested  IX: soft palate elevation  Normal  IX,X: gag reflex Present  XI: trapezius strength  5/5  XI: sternocleidomastoid strength 5/5  XI: neck flexion strength  5/5  XII: tongue strength  Normal    Data Review Lab Results  Component Value Date   WBC 7.3 10/02/2012   HGB 14.3 10/02/2012   HCT 40.0 10/02/2012   MCV 90.9 10/02/2012   PLT 417* 10/02/2012   Lab Results  Component Value Date   NA 137 10/01/2012   K 4.9 10/01/2012   CL 97 10/01/2012   CO2 19 10/01/2012   BUN 12 10/01/2012   CREATININE 1.03* 10/01/2012   GLUCOSE 103* 10/01/2012   No results found for this basename: INR, PROTIME    Assessment/Plan: 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 I have reviewed her MR scan with her and pointed out the abnormalities. We have discussed the various treatment options including surgery. I described the surgical option of a exploration of her cervical fusion with removal of the old plate from V4-U9, with a C3-4 and C4-5 anterior cervical discectomy fusion and plating. I have described the surgery to her. I asked her surgical models. I discussed the risks, benefits, alternatives, and likelihood of achieving our goals with surgery. I've answered all the patient's questions. She wants to proceed with surgery.   Teren Zurcher D 10/07/2012 8:26 AM

## 2012-10-07 NOTE — Progress Notes (Signed)
UR COMPLETED  

## 2012-10-07 NOTE — Op Note (Signed)
Brief history: The patient is a 72 year old white female who is undergone a previous C5-6 and C6-7 anterior cervical discectomy fusion plating by another physician years ago. She has developed recurrent neck and arm pain consistent with a cervical radiculopathy. She has failed medical management and was worked up with a cervical MRI. This demonstrated this degeneration, spondylosis, stenosis etc. at C3-4 and C4-5. I discussed situation with the patient. I reviewed her MR scan with her. We discussed the various treatment options including surgery. The patient has weighed the risks, benefits, and alternatives surgery and decided proceed with a C3-4 and C4-5 anterior cervical discectomy, fusion, and plating with removal of the plate from W0-J8.  Preoperative diagnosis: C3-4 and C4-5 disc degeneration, spondylosis, stenosis, cervical radiculopathy, cervicalgia  Postoperative diagnosis: The same  Procedure: C3-4 and C4-5 Anterior cervical discectomy/decompression; C3-4 and C4-5 interbody arthrodesis with local morcellized autograft bone and Actifuse bone graft extender; insertion of interbody prosthesis at C3-4 and C4-5 (Zimmer peek interbody prosthesis); anterior cervical plating from C3-C5 with globus titanium plate; removal of old plate at J1-B1.  Surgeon: Dr. Delma Officer  Asst.: Dr. Barbaraann Barthel  Anesthesia: Gen. endotracheal  Estimated blood loss: 100 cc  Drains: None  Complications: None  Description of procedure: The patient was brought to the operating room by the anesthesia team. General endotracheal anesthesia was induced. A roll was placed under the patient's shoulders to keep the neck in the neutral position. The patient's anterior cervical region was then prepared with Betadine scrub and Betadine solution. Sterile drapes were applied.  The area to be incised was then injected with Marcaine with epinephrine solution. I then used a scalpel to make a transverse incision in the patient's  left anterior neck. I used the Metzenbaum scissors to divide the platysmal muscle and then to dissect medial to the sternocleidomastoid muscle, jugular vein, and carotid artery. I carefully dissected down towards the anterior cervical spine identifying the esophagus and retracting it medially. Then using Kitner swabs to clear soft tissue from the anterior cervical spine. We then inserted a bent spinal needle into the upper exposed intervertebral disc space. We then obtained intraoperative radiographs confirm our location.  I then used electrocautery to detach the medial border of the longus colli muscle bilaterally from the C3-4 and C4-5 intervertebral disc spaces. I then inserted the Caspar self-retaining retractor underneath the longus colli muscle bilaterally to provide exposure.  We then incised the intervertebral disc at C3-4. We then performed a partial intervertebral discectomy with a pituitary forceps and the Karlin curettes. I then inserted distraction screws into the vertebral bodies at C3 and C4. We then distracted the interspace. We then used the high-speed drill to decorticate the vertebral endplates at C3-4, to drill away the remainder of the intervertebral disc, to drill away some posterior spondylosis, and to thin out the posterior longitudinal ligament. I then incised ligament with the arachnoid knife. We then removed the ligament with a Kerrison punches undercutting the vertebral endplates and decompressing the thecal sac. We then performed foraminotomies about the bilateral C4 nerve roots. This completed the decompression at this level.  I then repeated this procedure in an analogous fashion at C4-5. We decompressed the thecal sac and the bilateral C5 nerve roots.  We now turned our to attention to the interbody fusion. We used the trial spacers to determine the appropriate size for the interbody prosthesis. We then pre-filled prosthesis with a combination of local morcellized autograft  bone that we obtained during decompression as well  as Actifuse bone graft extender. We then inserted the prosthesis into the distracted interspace at C3-4 and C4-5. We then removed the distraction screws. There was a good snug fit of the prosthesis in the interspace.   Having completed the fusion we now turned attention to the anterior spinal instrumentation. We used the high-speed drill to drill away some anterior spondylosis at the disc spaces so that the plate lay down flat. We selected the appropriate length titanium anterior cervical plate. We laid it along the anterior aspect of the vertebral bodies from C3-C5. We then drilled 12 mm holes at C3, C4 and C5. We then secured the plate to the vertebral bodies by placing two 12 mm self-tapping screws at C3, C4 and C5. We then obtained intraoperative radiograph. The demonstrating good position of the instrumentation. We therefore secured the screws the plate the locking each cam. This completed the instrumentation.  We then obtained hemostasis using bipolar electrocautery. We irrigated the wound out with bacitracin solution. We then removed the retractor. We inspected the esophagus for any damage. There was none apparent. We then reapproximated patient's platysmal muscle with interrupted 3-0 Vicryl suture. We then reapproximated the subcutaneous tissue with interrupted 3-0 Vicryl suture. The skin was reapproximated with Steri-Strips and benzoin. The wound was then covered with bacitracin ointment. A sterile dressing was applied. The drapes were removed. Patient was subsequently extubated by the anesthesia team and transported to the post anesthesia care unit in stable condition. All sponge instrument and needle counts were reportedly correct at the end of this case.

## 2012-10-07 NOTE — Progress Notes (Signed)
ANTIBIOTIC CONSULT NOTE - INITIAL  Pharmacy Consult for Vancomycin Indication: post-op prophylaxis x 1 dose  Allergies  Allergen Reactions  . Ceftin (Cefuroxime Axetil)   . Tequin (Gatifloxacin)   . Trimethobenzamide     Patient Measurements: Height: 5\' 2"  (157.5 cm) Weight: 108 lb (48.988 kg) IBW/kg (Calculated) : 50.1   Pre-op labs:   From 10/01/12: Scr 1.03, WBC 7.3  Estimated Creatinine Clearance: 37.6 ml/min (by C-G formula based on Cr of 1.03).   Microbiology: Recent Results (from the past 720 hour(s))  SURGICAL PCR SCREEN     Status: Normal   Collection Time   10/02/12  2:58 PM      Component Value Range Status Comment   MRSA, PCR NEGATIVE  NEGATIVE Final    Staphylococcus aureus NEGATIVE  NEGATIVE Final     Medical History: Past Medical History  Diagnosis Date  . Asthma   . Osteoporosis   . COPD (chronic obstructive pulmonary disease)   . Peripheral vascular disease   . Patient on combined chemotherapy and radiation 2008  . History of skin ulcer   . Hypertension   . GERD (gastroesophageal reflux disease)   . 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 Cancer of esophagus 2008    s/p chemo and radiation   Assessment:   S/p 2-level anterior cervical discectomy/decompression.   Vancomycin 1 gram IV given pre-op at 8:30am.   For 1 dose ~ 12 hrs post-op.  No drain in.  Goal of Therapy:  Vancomycin trough level 10-15 mcg/ml  Plan:   Vancomycin 500 mg IV x 1 at 9pm tonight.  No Rx follow-up needed.  Dennie Fetters, RPh Pager: [phone] 10/07/2012,2:10 PM

## 2012-10-07 NOTE — Anesthesia Postprocedure Evaluation (Signed)
Anesthesia Post Note  Patient: Jennifer Simpson  Procedure(s) Performed: Procedure(s) (LRB): ANTERIOR CERVICAL DECOMPRESSION/DISCECTOMY FUSION 2 LEVEL/HARDWARE REMOVAL (N/A)  Anesthesia type: general  Patient location: PACU  Post pain: Pain level controlled  Post assessment: Patient's Cardiovascular Status Stable  Last Vitals:  Filed Vitals:   10/07/12 1320  BP: 149/64  Pulse: 66  Temp: 36.5 C  Resp: 18    Post vital signs: Reviewed and stable  Level of consciousness: sedated  Complications: No apparent anesthesia complications

## 2012-10-08 MED ORDER — OXYCODONE-ACETAMINOPHEN 5-325 MG PO TABS: 1.0000 | ORAL_TABLET | ORAL | Status: DC | PRN

## 2012-10-08 NOTE — Progress Notes (Signed)
Pt given D/C instructions with Rx. Pt verbalized understanding of D/C teaching with no questions. Pt D/C'd home via wheelchair per MD order. Rema Fendt, RN

## 2012-10-08 NOTE — Discharge Summary (Signed)
Physician Discharge Summary  Patient ID: Jennifer Simpson MRN: [id number] DOB/AGE: 1940-11-18 72 y.o.  Admit date: 10/07/2012 Discharge date: 10/08/2012  Admission Diagnoses: C3-4 and C4-5 disc degeneration, spondylosis, stenosis, cervical radiculopathy, cervicalgia  Discharge Diagnoses: The same Active Problems:  * No active hospital problems. *    Discharged Condition: good  Hospital Course: I admitted the patient to [facility] on 10/07/2012. On that day I performed a C3-4 and C4-5 intracervical discectomy, fusion, and plating with removal of an old plate from G4-W1. The surgery went well.  The patient's postoperative course was unremarkable. On postop day #1 the patient was requesting discharge to home. She was given oral and written discharge instructions. All her questions were answered.  Consults: None Significant Diagnostic Studies: None Treatments: C3-4 and C4-5 anterior cervical discectomy, fusion, plating, removal of old plate from U2-V2. Discharge Exam: Blood pressure 154/60, pulse 94, temperature 98.4 F (36.9 C), temperature source Oral, resp. rate 18, height 5\' 2"  (1.575 m), weight 48.988 kg (108 lb), SpO2 94.00%. The patient is alert and pleasant. She looks well. Her wound is without evidence of infection, hematoma, shift, etc. her strength is grossly normal in all 4 extremities.  Disposition: Home  Discharge Orders    Future Appointments: Provider: Department: Dept Phone: Center:   10/23/2012 2:30 PM Iran Ouch, MD [facility] [place] [phone] [email or web address]   11/03/2012 2:15 PM Iran Ouch, MD [facility] at [place] [phone] [email or web address]     Future Orders Please Complete By Expires   Diet - low sodium heart healthy      Increase activity slowly      Discharge instructions      Comments:   Call [phone] for a followup appointment.   No dressing needed      Call MD for:  temperature >100.4      Call MD for:   persistant nausea and vomiting      Call MD for:  severe uncontrolled pain      Call MD for:  redness, tenderness, or signs of infection (pain, swelling, redness, odor or green/yellow discharge around incision site)      Call MD for:  difficulty breathing, headache or visual disturbances      Call MD for:  hives      Call MD for:  persistant dizziness or light-headedness      Call MD for:  extreme fatigue          Medication List     As of 10/08/2012 11:08 AM    STOP taking these medications         HYDROcodone-acetaminophen 10-325 MG per tablet   Commonly known as: NORCO      TAKE these medications         albuterol-ipratropium 18-103 MCG/ACT inhaler   Commonly known as: COMBIVENT   Inhale 2 puffs into the lungs every 6 (six) hours as needed.      aspirin EC 81 MG tablet   Take 81 mg by mouth daily.      diltiazem 240 MG 24 hr capsule   Commonly known as: CARDIZEM CD   Take 1 capsule (240 mg total) by mouth daily.      GLUCOSAMINE CHONDR 1500 COMPLX PO   Take 1 tablet by mouth 3 (three) times daily.      hydrALAZINE 25 MG tablet   Commonly known as: APRESOLINE   Take 2 tablets (50 mg total) by mouth 3 (three) times daily.  ipratropium 0.06 % nasal spray   Commonly known as: ATROVENT   Place 2 sprays into the nose daily as needed. For nasal symptoms      losartan 100 MG tablet   Commonly known as: COZAAR   Take 1 tablet (100 mg total) by mouth daily.      multivitamin tablet   Take 1 tablet by mouth daily.      OVER THE COUNTER MEDICATION   Take 6 tablets by mouth daily. Equate brand of Fiber Therapy      oxyCODONE-acetaminophen 5-325 MG per tablet   Commonly known as: PERCOCET/ROXICET   Take 1-2 tablets by mouth every 4 (four) hours as needed.      pantoprazole 40 MG tablet   Commonly known as: PROTONIX   Take 40 mg by mouth daily.         SignedTressie Stalker D 10/08/2012, 11:08 AM

## 2012-10-09 ENCOUNTER — Encounter (HOSPITAL_COMMUNITY): Payer: Self-pay | Admitting: Neurosurgery

## 2012-10-12 ENCOUNTER — Telehealth: Payer: Self-pay | Admitting: *Deleted

## 2012-10-12 MED ORDER — HYDRALAZINE HCL 100 MG PO TABS: 100.0000 mg | ORAL_TABLET | Freq: Three times a day (TID) | ORAL | Status: DC

## 2012-10-12 NOTE — Telephone Encounter (Signed)
Pt had neck surgery as schedule 10/07/12. She was d/c home 2/6 from [facility] She calls today with concerns re:BP staying elevated BP yesterday as follows: 1610-960/45 0806-204/79 She took her am meds 0823-189/68 1330-189/68 1353-193/76 1547-170/59 2200-219/84 2216-223/86 2225-213/83 2252-204/76 4098-119/14 Today 7829-562/13 Micah Flesher to bed 0865- was awaken with neck pain, took pain meds, BP=198/66 0945 this am-201/75  She confirms compliance with meds and took extra 25 mg hydralazine last night She continues to take hydralazine 50 mg BID as prescribed Says pain is pretty well controlled, take hydrocodone as prescribed q6h PRN  She says she is abl eto come to appts as long as she can get a ride Worked her in with Dr. Kirke Corin tomm at 1100 I advised her ok to take extra 25 mg hydralazine PRN SBP>/=170 mm hg She verb understanding and will come to appt tomm

## 2012-10-12 NOTE — Telephone Encounter (Signed)
pt calling her BP was high 208/177, 189/68, 193/76, 219/84, 223/86, 201/75,

## 2012-10-12 NOTE — Telephone Encounter (Signed)
Please see what I told pt and advise if I need to do anything diff. Thanks!

## 2012-10-12 NOTE — Telephone Encounter (Signed)
Change Hydralazine to 100 mg Q 8 hours. Follow up on Friday.

## 2012-10-12 NOTE — Telephone Encounter (Signed)
Per manager, unable to work pt in tomm d/t MD scheduling conflict.  I called pt to tell her we are having to move appt and I will talk with Dr. Kirke Corin about what to do for pt between now and when we can see her Understanding verb.

## 2012-10-12 NOTE — Telephone Encounter (Signed)
Pt informed Understanding verb appt made for Friday 2./14 at 3:30 She will increase med as prescribed and will call us prior to apt if this does not help BP

## 2012-10-13 ENCOUNTER — Ambulatory Visit: Payer: Medicare Other | Admitting: Cardiovascular Disease

## 2012-10-16 ENCOUNTER — Ambulatory Visit: Payer: Medicare Other | Admitting: Cardiovascular Disease

## 2012-10-23 ENCOUNTER — Ambulatory Visit (INDEPENDENT_AMBULATORY_CARE_PROVIDER_SITE_OTHER): Payer: Medicare Other | Admitting: Cardiovascular Disease

## 2012-10-23 ENCOUNTER — Encounter: Payer: Self-pay | Admitting: Cardiovascular Disease

## 2012-10-23 VITALS — BP 140/60 | HR 75 | Ht 62.0 in | Wt 107.2 lb

## 2012-10-23 DIAGNOSIS — I739 Peripheral vascular disease, unspecified: Secondary | ICD-10-CM

## 2012-10-23 DIAGNOSIS — E785 Hyperlipidemia, unspecified: Secondary | ICD-10-CM | POA: Insufficient documentation

## 2012-10-23 DIAGNOSIS — I10 Essential (primary) hypertension: Secondary | ICD-10-CM

## 2012-10-23 MED ORDER — ATORVASTATIN CALCIUM 10 MG PO TABS: 10.0000 mg | ORAL_TABLET | Freq: Every day | ORAL | Status: DC

## 2012-10-23 NOTE — Assessment & Plan Note (Signed)
Status post iliac stenting in 2010. She had an ABI done which was moderately reduced on the right side. Aortoiliac duplex showed patent stents without significant restenosis. Previous angiogram showed moderate SFA disease. I will continue to monitor these on a yearly basis. She currently has no claudication.

## 2012-10-23 NOTE — Patient Instructions (Addendum)
Start Atorvastatin 10 mg daily Continue other medications.  Follow up in 2 months (be fasting for labs).

## 2012-10-23 NOTE — Assessment & Plan Note (Signed)
She does have known history of hyperlipidemia as well as underlying peripheral arterial disease and renal artery stenosis. Thus, there is an indication for treatment with a statin. I will start him today on atorvastatin 10 mg daily. I will keep the dose low given that she is on diltiazem.

## 2012-10-23 NOTE — Assessment & Plan Note (Signed)
The patient has intolerance to multiple blood pressure medications including amlodipine and carvedilol.  Diuretics caused worsening renal function. She is tolerating diltiazem, losartan and hydralazine. Home blood pressure readings are still not optimal. Renal artery duplex ultrasound showed evidence of significant renal artery stenosis. I discussed with the patient management options. I explained to her that renal artery stenting is now reserved as a last resort given the recent negative trials. I would prefer to try to control her hypertension medically. Obviously this has been difficult due to concerns about the medications. If blood pressure remains uncontrolled in spite of at least 3 medications, I will consider proceeding angiography and possible stenting.

## 2012-10-23 NOTE — Progress Notes (Signed)
HPI  This is a 72 year old female who is here today for a followup visit regarding refractory hypertension. She has no previous cardiac history. 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 Since July, 2013 , she started experiencing lower extremity edema which was worse on the left side associated with increased dyspnea especially in the morning with no orthopnea or PND.  She underwent cardiac evaluation which included a nuclear stress test which showed no evidence of ischemia. Echocardiogram showed normal LV systolic function with mild diastolic dysfunction and borderline pulmonary hypertension. It was felt that her dyspnea was due to COPD. Lower extremity edema was thought to be due to amlodipine which was discontinued and she was switched to Hyzaar. The patient called our office subsequently with elevated blood pressure on maximum dose Hyzaar. Amlodipine was resumed but at a lower dose. However, she again did not tolerate it due to lower extremity edema. Amlodipine was stopped and I decided to switch metoprolol to carvedilol for better blood pressure control. However, she did not tolerate the medication due to worsening dyspnea. I decided to start her on diltiazem extended release which thus far has tolerated. An attempt of continued thiazide diuretic with adding spironolactone lead to worsening renal function. All diuretics were discontinued. I started her on hydralazine and the dose was gradually increased to 100 mg every 8 hours. Her blood pressure at home remains elevated most of the time of 150 systolic although it is better today in the office. She complains of bad taste in her mouth with poor appetite.  Renal artery duplex ultrasound showed significant right renal artery stenosis and  moderate left renal artery stenosis. She had a recent cervical neck surgery and currently is wearing a brace.  Allergies  Allergen Reactions  . Ceftin (Cefuroxime Axetil)   . Tequin (Gatifloxacin)   . Trimethobenzamide      Current Outpatient Prescriptions on File Prior to Visit  Medication Sig Dispense Refill  . albuterol-ipratropium (COMBIVENT) 18-103 MCG/ACT inhaler Inhale 2 puffs into the lungs every 6 (six) hours as needed.      . diltiazem (CARDIZEM CD) 240 MG 24 hr capsule Take 1 capsule (240 mg total) by mouth daily.  30 capsule  6  . Glucosamine-Chondroit-Vit C-Mn (GLUCOSAMINE CHONDR 1500 COMPLX PO) Take 1 tablet by mouth 3 (three) times daily.       . hydrALAZINE (APRESOLINE) 100 MG tablet Take 1 tablet (100 mg total) by mouth 3 (three) times daily.  90 tablet  3  . ipratropium (ATROVENT) 0.06 % nasal spray Place 2 sprays into the nose daily as needed. For nasal symptoms      . losartan (COZAAR) 100 MG tablet Take 1 tablet (100 mg total) by mouth daily.  90 tablet  3  . Multiple Vitamin (MULTIVITAMIN) tablet Take 1 tablet by mouth daily.      Marland Kitchen OVER THE COUNTER MEDICATION Take 6 tablets by mouth daily. Equate brand of Fiber Therapy      . oxyCODONE-acetaminophen (PERCOCET/ROXICET) 5-325 MG per tablet Take 1-2 tablets by mouth every 4 (four) hours as needed.  100 tablet  0  . pantoprazole (PROTONIX) 40 MG tablet Take 40 mg by mouth daily.      Marland Kitchen aspirin EC 81 MG tablet Take 81 mg by mouth daily.  No current facility-administered medications on file prior to visit.     Past Medical History  Diagnosis Date  . Asthma   . Osteoporosis   . COPD (chronic obstructive pulmonary disease)   . Peripheral vascular disease   . Patient on combined chemotherapy and radiation 2008  . History of skin ulcer   . Hypertension   . GERD (gastroesophageal reflux disease)   . 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 Cancer of esophagus 2008    s/p chemo and radiation      Past Surgical History  Procedure Laterality Date  . Breast biopsy      negative  . Peripheral arterial stent graft      x2 STENTS BOTH LEGS  . Neck surgery    . Portacath placement  2008  . Anterior cervical decomp/discectomy fusion  10/07/2012    Procedure: ANTERIOR CERVICAL DECOMPRESSION/DISCECTOMY FUSION 2 LEVEL/HARDWARE REMOVAL;  Surgeon: Cristi Loron, MD;  Location: [facility];  Service: Neurosurgery;  Laterality: N/A;  Cervical three-four, cervical four-five Anterior cervical decompression/diskectomy/fusion/interbody plating and prosthesis/removal of spinal concept plate  . Neck surgery      infused     History reviewed. No pertinent family history.   History   Social History  . Marital Status: Widowed    Spouse Name: N/A    Number of Children: N/A  . Years of Education: N/A   Occupational History  . Not on file.   Social History Main Topics  . Smoking status: Current Every Day Smoker -- 0.25 packs/day for 50 years    Types: Cigarettes  . Smokeless tobacco: Not on file  . Alcohol Use: Not on file  . Drug Use: No  . Sexually Active: Not on file   Other Topics Concern  . Not on file   Social History Narrative  . No narrative on file      PHYSICAL EXAM   BP 140/60  Pulse 75  Ht 5\' 2"  (1.575 m)  Wt 107 lb 4 oz (48.648 kg)  BMI 19.61 kg/m2 Constitutional: She is oriented to person, place, and time. She appears well-developed and well-nourished. No distress.  HENT: No nasal discharge.  Head: Normocephalic and atraumatic.  Eyes: Pupils are equal and round. Right eye exhibits no discharge. Left eye exhibits no discharge.  Neck: Normal range of motion. Neck supple. No JVD present. No thyromegaly present.  Cardiovascular: Normal rate, regular rhythm, normal heart sounds. Exam reveals no gallop and no friction rub. No murmur heard.  Pulmonary/Chest: Effort normal and breath sounds normal. No stridor. No respiratory distress. She has no wheezes. She  has no rales. She exhibits no tenderness.  Abdominal: Soft. Bowel sounds are normal. She exhibits no distension. There is no tenderness. There is no rebound and no guarding.  Musculoskeletal: Normal range of motion. She exhibits +1 edema and no tenderness.  Neurological: She is alert and oriented to person, place, and time. Coordination normal.  Skin: Skin is warm and dry. No rash noted. She is not diaphoretic. No erythema. No pallor.  Psychiatric: She has a normal mood and affect. Her behavior is normal. Judgment and thought content normal.   RJJ:OACZY  Rhythm  -Right atrial enlargement.   -Poor R-wave progression.   -  Nonspecific T-abnormality.   Right atrial abnormality and rotation -possible pulmonary disease.   ABNORMAL    ASSESSMENT AND PLAN

## 2012-11-03 ENCOUNTER — Ambulatory Visit: Payer: Medicare Other | Admitting: Cardiovascular Disease

## 2012-11-16 ENCOUNTER — Telehealth: Payer: Self-pay

## 2012-11-16 NOTE — Telephone Encounter (Signed)
Pt would like for you to call her re: her BP.

## 2012-11-17 ENCOUNTER — Other Ambulatory Visit: Payer: Self-pay

## 2012-11-17 MED ORDER — CHLORTHALIDONE 25 MG PO TABS: 12.5000 mg | ORAL_TABLET | Freq: Every day | ORAL | Status: DC

## 2012-11-17 NOTE — Telephone Encounter (Signed)
Check BMP in 1 week 

## 2012-11-17 NOTE — Telephone Encounter (Signed)
Pt informed She wishes to wait and see how this new medication works for her BP before scheduling procedure I will send RX to pharm

## 2012-11-17 NOTE — Telephone Encounter (Signed)
Pt says BP remains elevated since last OV 186/68 200/74 169/59 207/? 171/59 this am  She says she has a friend that takes the following meds and asks if she can try any of these: Lisinopril,atenolol,micardis, nifedipine, methyldopa I explained she has tried medications similar to these in past and could not tolerate and that some of these medications are in the same families as what she is taking now  i will share with dr. Kirke Corin In meantime, I have advised her to try an extra 1/2 tablet of hydralazine if BP stays elevated She c/o dull h/a Has not taken anything for pain I advised to use tylenol versus ibuprofen if she decided to take something for the h/a

## 2012-11-17 NOTE — Telephone Encounter (Signed)
I agree with you. We tried all similar medications. I recommend adding Chlorthalidone 12.5 mg once daily. I discussed with her during last visit that if BP remains elevated, I recommend proceeding with renal artery angiography and possible stenting. We should go ahead and schedule that. Schedule that with me on a Wednesday at [facility] lab.

## 2012-11-17 NOTE — Telephone Encounter (Signed)
FYI

## 2012-11-17 NOTE — Telephone Encounter (Signed)
No answer

## 2012-11-18 NOTE — Telephone Encounter (Signed)
"  unable to accept voicemail messages at this time" Will try again later

## 2012-11-19 ENCOUNTER — Other Ambulatory Visit: Payer: Self-pay

## 2012-11-19 NOTE — Telephone Encounter (Signed)
Pt informed Scheduled for BMP 3/26 at 2 pm

## 2012-11-25 ENCOUNTER — Other Ambulatory Visit (INDEPENDENT_AMBULATORY_CARE_PROVIDER_SITE_OTHER): Payer: Medicare Other

## 2012-11-25 ENCOUNTER — Telehealth: Payer: Self-pay

## 2012-11-25 DIAGNOSIS — I10 Essential (primary) hypertension: Secondary | ICD-10-CM

## 2012-11-25 NOTE — Telephone Encounter (Signed)
Pt informed She wishes to proceed with renal artery stenting at [facility] i will call to arrange and will then call her back

## 2012-11-25 NOTE — Telephone Encounter (Signed)
Scheduled for 12/02/12 at 1030 I will call pt with instructions

## 2012-11-25 NOTE — Telephone Encounter (Signed)
Diltiazem can not be stopped as there are no other good alternatives.  BP is improved but still not controlled in spite of multiple medications. Probably best to proceed with renal angiography and possible stenting. She might be able to come off some of BP medications if the procedure is successful.

## 2012-11-25 NOTE — Telephone Encounter (Signed)
Pt came for BMP She wanted Dr. Kirke Corin to know she has lost 12 pounds in 2 weeks Has no appetite and thinks this is secondary to diltiazem She asks if she needs to stop this medication  BPs are better on current regimen 167/59 140/50 178/67 170/64 197/67 150/53 157/55 161/55 153/55 159/59 218/76 (forgot hydralazine this day)  I told her I would make Dr. Kirke Corin  Aware of this change and will call her back with his instruction Understanding verb

## 2012-11-26 ENCOUNTER — Other Ambulatory Visit: Payer: Self-pay

## 2012-11-26 ENCOUNTER — Encounter (HOSPITAL_COMMUNITY): Payer: Self-pay | Admitting: Pharmacy Technician

## 2012-11-26 DIAGNOSIS — I10 Essential (primary) hypertension: Secondary | ICD-10-CM

## 2012-11-26 LAB — BASIC METABOLIC PANEL
BUN/Creatinine Ratio: 8 — ABNORMAL LOW (ref 11–26)
BUN: 11 mg/dL (ref 8–27)
CO2: 23 mmol/L (ref 19–28)
Creatinine, Ser: 1.34 mg/dL — ABNORMAL HIGH (ref 0.57–1.00)

## 2012-11-26 NOTE — Telephone Encounter (Signed)
LMTCB

## 2012-11-26 NOTE — Telephone Encounter (Signed)
Verbal instructions given to pt re: Peripheral case Understanding verb Coming in 4/1 for labs

## 2012-12-01 ENCOUNTER — Ambulatory Visit (INDEPENDENT_AMBULATORY_CARE_PROVIDER_SITE_OTHER): Payer: Medicare Other

## 2012-12-01 DIAGNOSIS — I10 Essential (primary) hypertension: Secondary | ICD-10-CM

## 2012-12-02 ENCOUNTER — Ambulatory Visit (HOSPITAL_COMMUNITY)
Admission: RE | Admit: 2012-12-02 | Discharge: 2012-12-03 | Disposition: A | Discharge status: Home / Self Care | Payer: Medicare Other | Source: Ambulatory Visit | Attending: Cardiovascular Disease | Admitting: Cardiovascular Disease

## 2012-12-02 ENCOUNTER — Encounter (HOSPITAL_COMMUNITY): Payer: Self-pay | Admitting: General Practice

## 2012-12-02 ENCOUNTER — Encounter (HOSPITAL_COMMUNITY)
Admission: RE | Disposition: A | Discharge status: Home / Self Care | Payer: Self-pay | Source: Ambulatory Visit | Attending: Cardiovascular Disease

## 2012-12-02 DIAGNOSIS — J449 Chronic obstructive pulmonary disease, unspecified: Secondary | ICD-10-CM | POA: Insufficient documentation

## 2012-12-02 DIAGNOSIS — E871 Hypo-osmolality and hyponatremia: Secondary | ICD-10-CM | POA: Insufficient documentation

## 2012-12-02 DIAGNOSIS — E876 Hypokalemia: Secondary | ICD-10-CM | POA: Insufficient documentation

## 2012-12-02 DIAGNOSIS — J4489 Other specified chronic obstructive pulmonary disease: Secondary | ICD-10-CM | POA: Insufficient documentation

## 2012-12-02 DIAGNOSIS — F17200 Nicotine dependence, unspecified, uncomplicated: Secondary | ICD-10-CM | POA: Insufficient documentation

## 2012-12-02 DIAGNOSIS — I739 Peripheral vascular disease, unspecified: Secondary | ICD-10-CM | POA: Insufficient documentation

## 2012-12-02 DIAGNOSIS — I10 Essential (primary) hypertension: Secondary | ICD-10-CM

## 2012-12-02 DIAGNOSIS — I700 Atherosclerosis of aorta: Secondary | ICD-10-CM | POA: Insufficient documentation

## 2012-12-02 DIAGNOSIS — I129 Hypertensive chronic kidney disease with stage 1 through stage 4 chronic kidney disease, or unspecified chronic kidney disease: Secondary | ICD-10-CM | POA: Insufficient documentation

## 2012-12-02 DIAGNOSIS — I701 Atherosclerosis of renal artery: Secondary | ICD-10-CM

## 2012-12-02 DIAGNOSIS — N182 Chronic kidney disease, stage 2 (mild): Secondary | ICD-10-CM | POA: Insufficient documentation

## 2012-12-02 HISTORY — DX: Hypo-osmolality and hyponatremia: E87.1

## 2012-12-02 HISTORY — PX: RENAL ARTERY STENT: SHX2321

## 2012-12-02 HISTORY — DX: Personal history of other diseases of the digestive system: Z87.19

## 2012-12-02 HISTORY — DX: Personal history of peptic ulcer disease: Z87.11

## 2012-12-02 HISTORY — PX: RENAL ANGIOGRAM: SHX5509

## 2012-12-02 LAB — POCT ACTIVATED CLOTTING TIME: Activated Clotting Time: 247 seconds

## 2012-12-02 LAB — PROTIME-INR
INR: 1 (ref 0.8–1.2)
Prothrombin Time: 11.1 s (ref 9.1–12.0)

## 2012-12-02 LAB — CBC WITH DIFFERENTIAL
Basos: 1 % (ref 0–3)
Eos: 0 % (ref 0–5)
Hemoglobin: 13.4 g/dL (ref 11.1–15.9)
Immature Grans (Abs): 0 10*3/uL (ref 0.0–0.1)
Lymphs: 11 % — ABNORMAL LOW (ref 14–46)
MCH: 28.8 pg (ref 26.6–33.0)
Neutrophils Relative %: 75 % — ABNORMAL HIGH (ref 40–74)
RBC: 4.65 x10E6/uL (ref 3.77–5.28)

## 2012-12-02 SURGERY — RENAL ANGIOGRAM
Anesthesia: LOCAL | Laterality: Right

## 2012-12-02 MED ORDER — CLOPIDOGREL BISULFATE 75 MG PO TABS
75.0000 mg | ORAL_TABLET | Freq: Every day | ORAL | Status: DC
  Administered 2012-12-03: 75 mg via ORAL
  Filled 2012-12-02: qty 1

## 2012-12-02 MED ORDER — LABETALOL HCL 5 MG/ML IV SOLN
INTRAVENOUS | Status: AC
  Filled 2012-12-02: qty 4

## 2012-12-02 MED ORDER — CHLORTHALIDONE 25 MG PO TABS
12.5000 mg | ORAL_TABLET | Freq: Every day | ORAL | Status: DC
  Filled 2012-12-02: qty 0.5

## 2012-12-02 MED ORDER — HEPARIN (PORCINE) IN NACL 2-0.9 UNIT/ML-% IJ SOLN
INTRAMUSCULAR | Status: AC
  Filled 2012-12-02: qty 1000

## 2012-12-02 MED ORDER — CLOPIDOGREL BISULFATE 75 MG PO TABS
ORAL_TABLET | ORAL | Status: AC
  Filled 2012-12-02: qty 4

## 2012-12-02 MED ORDER — LOSARTAN POTASSIUM 50 MG PO TABS
100.0000 mg | ORAL_TABLET | Freq: Every day | ORAL | Status: DC
  Administered 2012-12-03: 11:00:00 100 mg via ORAL
  Filled 2012-12-02: qty 2

## 2012-12-02 MED ORDER — IPRATROPIUM-ALBUTEROL 18-103 MCG/ACT IN AERO: 2.0000 | INHALATION_SPRAY | Freq: Four times a day (QID) | RESPIRATORY_TRACT | Status: DC | PRN

## 2012-12-02 MED ORDER — FENTANYL CITRATE 0.05 MG/ML IJ SOLN
INTRAMUSCULAR | Status: AC
  Filled 2012-12-02: qty 2

## 2012-12-02 MED ORDER — IPRATROPIUM BROMIDE 0.06 % NA SOLN
2.0000 | Freq: Three times a day (TID) | NASAL | Status: DC
  Filled 2012-12-02: qty 15

## 2012-12-02 MED ORDER — HYDRALAZINE HCL 50 MG PO TABS
100.0000 mg | ORAL_TABLET | Freq: Three times a day (TID) | ORAL | Status: DC
  Administered 2012-12-02 – 2012-12-03 (×4): 100 mg via ORAL
  Filled 2012-12-02 (×6): qty 2

## 2012-12-02 MED ORDER — ACETAMINOPHEN 500 MG PO TABS
1000.0000 mg | ORAL_TABLET | Freq: Four times a day (QID) | ORAL | Status: DC | PRN
  Filled 2012-12-02: qty 2

## 2012-12-02 MED ORDER — DILTIAZEM HCL ER COATED BEADS 240 MG PO CP24
240.0000 mg | ORAL_CAPSULE | Freq: Every day | ORAL | Status: DC
  Administered 2012-12-03: 11:00:00 240 mg via ORAL
  Filled 2012-12-02: qty 1

## 2012-12-02 MED ORDER — LIDOCAINE HCL (PF) 1 % IJ SOLN
INTRAMUSCULAR | Status: AC
  Filled 2012-12-02: qty 30

## 2012-12-02 MED ORDER — ASPIRIN 81 MG PO CHEW
324.0000 mg | CHEWABLE_TABLET | ORAL | Status: AC
  Administered 2012-12-02: 324 mg via ORAL
  Filled 2012-12-02: qty 4

## 2012-12-02 MED ORDER — ASPIRIN EC 81 MG PO TBEC
81.0000 mg | DELAYED_RELEASE_TABLET | Freq: Every day | ORAL | Status: DC
  Administered 2012-12-03: 81 mg via ORAL
  Filled 2012-12-02 (×2): qty 1

## 2012-12-02 MED ORDER — CLOPIDOGREL BISULFATE 300 MG PO TABS
ORAL_TABLET | ORAL | Status: AC
  Filled 2012-12-02: qty 1

## 2012-12-02 MED ORDER — SODIUM CHLORIDE 0.9 % IV SOLN
INTRAVENOUS | Status: AC
  Administered 2012-12-02: 15:00:00 via INTRAVENOUS

## 2012-12-02 MED ORDER — ONDANSETRON HCL 4 MG/2ML IJ SOLN: 4.0000 mg | Freq: Four times a day (QID) | INTRAMUSCULAR | Status: DC | PRN

## 2012-12-02 MED ORDER — MIDAZOLAM HCL 2 MG/2ML IJ SOLN
INTRAMUSCULAR | Status: AC
  Filled 2012-12-02: qty 2

## 2012-12-02 MED ORDER — PANTOPRAZOLE SODIUM 40 MG PO TBEC
40.0000 mg | DELAYED_RELEASE_TABLET | Freq: Every day | ORAL | Status: DC
Start: 2012-12-03 — End: 2012-12-03
  Administered 2012-12-03: 11:00:00 40 mg via ORAL
  Filled 2012-12-02: qty 1

## 2012-12-02 MED ORDER — SODIUM CHLORIDE 0.9 % IV SOLN
INTRAVENOUS | Status: DC
  Administered 2012-12-02: 10:00:00 via INTRAVENOUS

## 2012-12-02 NOTE — CV Procedure (Signed)
PERIPHERAL VASCULAR PROCEDURE  NAME:  Jennifer Simpson   MRN: [id number] DOB:  1940-11-20   ADMIT DATE: 12/02/2012  Performing Cardiologist: Lorine Bears Primary Physician: Sampson Goon, DAVID, MD  Procedures Performed:  Abdominal Aortic Angiogram without Bi-Iliofemoral Runoff (Access via the left femoral artery due to inability to cannulate right femoral artery due to calcifications)   Selective bilateral renal artery angiography  Right renal artery angioplasty.   Right renal artery stent placement.   Failed Mynx closure device due to ruptured balloon. Manual pressure held.   Indication(s):   Refractory hypertension with mild chronic kidney disease and bilateral renal artery stenosis noted on duplex ultrasound.   Consent: The procedure with Risks/Benefits/Alternatives and Indications was reviewed with the patient .  All questions were answered.  Medications:  Sedation:  1 mg IV Versed, 25 mcg IV Fentanyl  Contrast:  83 mL of Visipaque   Procedural details: The left groin was prepped, draped, and anesthetized with 1% lidocaine. Using modified Seldinger technique, a 6 French sheath was introduced into the left common femoral artery. Initial attempt of cannulating the right femoral artery was not successful due to calcifications.  A 5 Fr Short Pigtail Catheter was advanced of over a  Versicore wire into the descending Aorta to a level just above the renal arteries. A power injection of 15 ml/sec contrast over 1 sec was performed for Abdominal Aortic Angiography nonselective renal angiography.     Interventional Procedure:  She was given 4000 units of unfractionated heparin with an ACT of 247. I elected to intervene on the right renal artery given that this was more significant by Doppler velocity. The procedure overall was difficult to do to significant atherosclerosis in the aorta and difficulty cannulating the renal arteries. I used an IM guide but could not cannulate the renal  artery selectively. I thus used a 5 [place] SOS catheter which engaged the right renal artery. This was inside the guiding catheter. I used a [place] core wire to cross the lesion in the right renal artery. Then carefully I advanced the guide over the SOS catheter to engage the right renal artery. The SOS catheter was then removed. The lesion in the ostial renal artery was ballooned with a 4 x 15 mm balloon. The lesion was then with a 5.5 x 15 mm Herculink stent which was placed posteriorly with few millimeters in the aorta. This was deployed to 12 atmosphere. I then pulled the balloon back to flare the ostium to 14 atmospheres. Final angiography showed good results with mild 10% residual stenosis.   Hemodynamics:  Central Aortic Pressure / Mean Aortic Pressure: 171/57.  Findings:  Abdominal aorta: Mildly dilated above the renal arteries. The aorta has severe diffuse atherosclerosis.  Left renal artery: There is an 80-90% calcified ostial stenosis shortly before it bifurcates into 2 branches.  Right renal artery:  There is single right renal artery with 80-90% ostial stenosis.  Celiac artery: Patent but not well visualized.  Superior mesenteric artery: Patent.   Conclusions: 1. Severe bilateral ostial renal artery stenosis. 2. Successful angioplasty and bare-metal stent placement to the ostial right renal artery. This was overall a difficult procedure due to significant atherosclerosis and calcifications in the aorta with difficulty engaging the renal arteries selectively.  Recommendations:  I recommend dual antiplatelet therapy for at least one month. I will admit overnight for hydration and close monitoring of blood pressure. I elected not to intervene on both renal arteries in one session due to risk  of blood pressure drop. Staged angioplasty of the left renal artery will be considered based on clinical response. (The same technique will need to be used to engage the left renal artery).     Lorine Bears, MD, [facility] 12/02/2012 1:28 PM

## 2012-12-02 NOTE — H&P (Signed)
History and Physical  Patient ID: Jennifer Simpson MRN: [id number] DOB/AGE: [birth year] 74 y.o. 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 Since July, 2013 , she started experiencing lower extremity edema which was worse on the left side associated with increased dyspnea especially in the morning with no orthopnea or PND.  She underwent cardiac evaluation which included a nuclear stress test which showed no evidence of ischemia. Echocardiogram showed normal LV systolic function with mild diastolic dysfunction and borderline pulmonary hypertension. It was felt that her dyspnea was due to COPD. Lower extremity edema was thought to be due to amlodipine which was discontinued and she was switched to Hyzaar. I have been trying to control her blood pressure with multiple medications but still with poor control.  Renal artery duplex ultrasound showed significant right renal artery stenosis and moderate left renal artery stenosis.  Due to refractory hypertension in spite of multiple medications, I recommended proceeding with renal angiography and possible stenting.    Review of systems complete and found to be negative unless listed above  Past Medical History  Diagnosis Date  . Asthma   . Osteoporosis   . COPD (chronic obstructive pulmonary disease)   . Peripheral vascular disease   . Patient on combined chemotherapy and radiation 2008  . History of skin ulcer   . Hypertension   . GERD (gastroesophageal reflux disease)   . 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 Cancer of esophagus 2008    s/p chemo and radiation    No family history on file.  History   Social History  . Marital Status: Widowed    Spouse  Name: N/A    Number of Children: N/A  . Years of Education: N/A   Occupational History  . Not on file.   Social History Main Topics  . Smoking status: Current Every Day Smoker -- 0.25 packs/day for 50 years    Types: Cigarettes  . Smokeless tobacco: Not on file  . Alcohol Use: Not on file  . Drug Use: No  . Sexually Active: Not on file   Other Topics Concern  . Not on file   Social History Narrative  . No narrative on file    Past Surgical History  Procedure Laterality Date  . Breast biopsy      negative  . Peripheral arterial stent graft      x2 STENTS BOTH LEGS  . Neck surgery    . Portacath placement  2008  . Anterior cervical decomp/discectomy fusion  10/07/2012    Procedure: ANTERIOR CERVICAL DECOMPRESSION/DISCECTOMY FUSION 2 LEVEL/HARDWARE REMOVAL;  Surgeon: Cristi Loron, MD;  Location: [facility];  Service: Neurosurgery;  Laterality: N/A;  Cervical three-four, cervical four-five Anterior cervical decompression/diskectomy/fusion/interbody plating and prosthesis/removal of spinal concept plate  . Neck surgery      infused     Prescriptions prior to admission  Medication Sig Dispense Refill  . acetaminophen (TYLENOL) 500 MG tablet Take 1,000 mg by mouth every 6 (six) hours as needed for pain.      Marland Kitchen albuterol-ipratropium (COMBIVENT) 18-103 MCG/ACT inhaler Inhale 2 puffs into the lungs every 6 (six) hours as needed.      Marland Kitchen aspirin EC 81 MG tablet Take 81 mg  by mouth daily.      . chlorthalidone (HYGROTON) 25 MG tablet Take 0.5 tablets (12.5 mg total) by mouth daily.  30 tablet  3  . diltiazem (CARDIZEM CD) 240 MG 24 hr capsule Take 1 capsule (240 mg total) by mouth daily.  30 capsule  6  . hydrALAZINE (APRESOLINE) 100 MG tablet Take 1 tablet (100 mg total) by mouth 3 (three) times daily.  90 tablet  3  . ipratropium (ATROVENT) 0.06 % nasal spray Place 2 sprays into the nose daily as needed. For nasal symptoms      . losartan (COZAAR) 100 MG tablet Take 1 tablet  (100 mg total) by mouth daily.  90 tablet  3  . pantoprazole (PROTONIX) 40 MG tablet Take 40 mg by mouth daily.      Marland Kitchen PRESCRIPTION MEDICATION Apply 1 application topically daily as needed (called "dermilome").        Physical Exam: Blood pressure 167/60, pulse 76, temperature 96.4 F (35.8 C), temperature source Oral, resp. rate 16, height 5\' 2"  (1.575 m), weight 43.545 kg (96 lb), SpO2 97.00%.   Constitutional: She is oriented to person, place, and time. She appears well-developed and well-nourished. No distress.  HENT: No nasal discharge.  Head: Normocephalic and atraumatic.  Eyes: Pupils are equal and round. Right eye exhibits no discharge. Left eye exhibits no discharge.  Neck: Normal range of motion. Neck supple. No JVD present. No thyromegaly present.  Cardiovascular: Normal rate, regular rhythm, normal heart sounds. Exam reveals no gallop and no friction rub. No murmur heard.  Pulmonary/Chest: Effort normal and breath sounds normal. No stridor. No respiratory distress. She has no wheezes. She has no rales. She exhibits no tenderness.  Abdominal: Soft. Bowel sounds are normal. She exhibits no distension. There is no tenderness. There is no rebound and no guarding.  Musculoskeletal: Normal range of motion. She exhibits no edema and no tenderness.  Neurological: She is alert and oriented to person, place, and time. Coordination normal.  Skin: Skin is warm and dry. No rash noted. She is not diaphoretic. No erythema. No pallor.  Psychiatric: She has a normal mood and affect. Her behavior is normal. Judgment and thought content normal.    Labs:   Lab Results  Component Value Date   WBC 6.3 12/01/2012   HGB 13.4 12/01/2012   HCT 39.2 12/01/2012   MCV 84 12/01/2012   PLT 378 12/01/2012    Recent Labs Lab 11/25/12 1349  NA 131*  K 3.5  CL 90*  CO2 23  BUN 11  CREATININE 1.34*  CALCIUM 9.0  GLUCOSE 162*   No results found for this basename: CKTOTAL, CKMB, CKMBINDEX, TROPONINI    No  results found for this basename: CHOL   No results found for this basename: HDL   No results found for this basename: LDLCALC   No results found for this basename: TRIG   No results found for this basename: CHOLHDL   No results found for this basename: LDLDIRECT      ASSESSMENT AND PLAN:  Refractory hypertension with evidence of bilateral renal artery stenosis: renal angiography and possible stenting today.    Signed: Lorine Bears MD, [facility] 12/02/2012, 11:39 AM

## 2012-12-03 ENCOUNTER — Other Ambulatory Visit: Payer: Self-pay | Admitting: Physician Assistant

## 2012-12-03 ENCOUNTER — Encounter (HOSPITAL_COMMUNITY): Payer: Self-pay | Admitting: Physician Assistant

## 2012-12-03 DIAGNOSIS — N182 Chronic kidney disease, stage 2 (mild): Secondary | ICD-10-CM

## 2012-12-03 DIAGNOSIS — I701 Atherosclerosis of renal artery: Secondary | ICD-10-CM | POA: Diagnosis present

## 2012-12-03 LAB — BASIC METABOLIC PANEL
BUN: 15 mg/dL (ref 6–23)
Calcium: 8.6 mg/dL (ref 8.4–10.5)
Creatinine, Ser: 1.34 mg/dL — ABNORMAL HIGH (ref 0.50–1.10)
GFR calc Af Amer: 44 mL/min — ABNORMAL LOW (ref >90)
GFR calc non Af Amer: 38 mL/min — ABNORMAL LOW (ref >90)

## 2012-12-03 LAB — CBC
HCT: 31.8 % — ABNORMAL LOW (ref 36.0–46.0)
MCHC: 35.8 g/dL (ref 30.0–36.0)
MCV: 80.3 fL (ref 78.0–100.0)
Platelets: 266 10*3/uL (ref 150–400)
RDW: 15.2 % (ref 11.5–15.5)
WBC: 5.4 10*3/uL (ref 4.0–10.5)

## 2012-12-03 MED ORDER — POTASSIUM CHLORIDE CRYS ER 20 MEQ PO TBCR: 20.0000 meq | EXTENDED_RELEASE_TABLET | Freq: Every day | ORAL | Status: DC

## 2012-12-03 MED ORDER — CLOPIDOGREL BISULFATE 75 MG PO TABS: 75.0000 mg | ORAL_TABLET | Freq: Every day | ORAL | Status: DC

## 2012-12-03 MED ORDER — POTASSIUM CHLORIDE CRYS ER 20 MEQ PO TBCR
20.0000 meq | EXTENDED_RELEASE_TABLET | Freq: Every day | ORAL | Status: DC
  Administered 2012-12-03: 10:00:00 20 meq via ORAL
  Filled 2012-12-03: qty 1

## 2012-12-03 NOTE — Progress Notes (Signed)
    Subjective:  No pain this am. Eager to go home.  Objective:  Vital Signs in the last 24 hours: Temp:  [96.4 F (35.8 C)-98.1 F (36.7 C)] 97.8 F (36.6 C) (04/03 0800) Pulse Rate:  [52-76] 68 (04/03 0432) Resp:  [16] 16 (04/02 0946) BP: (132-173)/(23-60) 173/52 mmHg (04/03 0432) SpO2:  [95 %-99 %] 95 % (04/03 0432) Weight:  [96 lb (43.545 kg)-96 lb 9 oz (43.8 kg)] 96 lb 9 oz (43.8 kg) (04/03 0432)  Intake/Output from previous day: 04/02 0701 - 04/03 0700 In: 558.8 [P.O.:240; I.V.:318.8] Out: 2000 [Urine:2000]  Physical Exam: Pt is alert and oriented, elderly woman in NAD HEENT: normal Neck: JVP - normal Lungs: Coarse bilaterally CV: RRR with 2/6 systolic ejection murmur at the LSB Abd: soft, NT, Positive BS, no hepatomegaly Ext: no C/C/E, bilateral groin sites clear Skin: warm/dry no rash  Lab Results:  Recent Labs  12/01/12 1459 12/03/12 0425  WBC 6.3 5.4  HGB 13.4 11.4*  PLT 378 266    Recent Labs  12/03/12 0425  NA 124*  K 3.1*  CL 90*  CO2 23  GLUCOSE 85  BUN 15  CREATININE 1.34*   No results found for this basename: TROPONINI, CK, MB,  in the last 72 hours  Assessment/Plan:  1. Renal artery stenosis s/p complex PTA/stent procedure 2. HTN, malignant  3. PAD  BP stable, renal function stable. Hyponatremia and hypokalemia noted. Plan d/c home today on home meds with addition of plavix 75 mg daily. Will add KDur 20 meq daily. Follow-up BMET next week and f/u Dr Kirke Corin in [place].  Tonny Bollman, M.D. 12/03/2012, 8:52 AM

## 2012-12-03 NOTE — Plan of Care (Signed)
Problem: Consults Goal: Tobacco Cessation referral if indicated Outcome: Not Met (add Reason) Pt refuses tobacco cessation education

## 2012-12-03 NOTE — Progress Notes (Signed)
Utilization Review Completed Female Iafrate J. Blasa Raisch, RN, BSN, NCM [phone]  

## 2012-12-03 NOTE — Discharge Summary (Signed)
Discharge Summary   Patient ID: Jennifer Simpson MRN: [id number], DOB/AGE: 05-May-1941 72 y.o. Admit date: 12/02/2012 D/C date:     12/03/2012  Primary Cardiologist: Kirke Corin  Primary Discharge Diagnoses:  1. Renal artery stenosis s/p complex PTA/stent to the R renal artery 12/02/12 (Dr. Kirke Corin elected not to intervene on both arteries in one session due to risk of blood pressure drop; staged angioplasty of the left renal artery will be considered based on clinical response) 2. HTN 3. PAD - h/o iliac stents 2010 4. Hypokalemia - chlorthalidone discontinued, await BMET 1 week to determine if regular supplementation is needed 5. Hyponatremia  - chlorthalidone discontinued due to Na 124 (started 3/18, sodium 137-> 131 -> 124 since that time) - h/o hyponatremia while on HCTZ as well  Secondary Discharge Diagnoses:  1. COPD active smoking 2. 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 GERD 4. Asthma 5. Osteoporosis 6. History of skin ulcer 7. CKD stage II 8. History of stomach ulcers 9. Headache 10. 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 She underwent cardiac evaluation in July 2013 for LEE and dyspnea. A stress test showed no evidence of ischemia. Echocardiogram showed normal LV systolic function with mild diastolic dysfunction and borderline pulmonary hypertension. It was felt that her dyspnea was due to COPD. Lower extremity edema was thought to be due to amlodipine which was discontinued and she was switched to Hyzaar. Dr. Kirke Corin was attempting to control her blood pressure with multiple medications but still with poor control. Renal artery  duplex ultrasound showed significant right renal artery stenosis and moderate left renal artery stenosis. Due to refractory hypertension in spite of multiple medications, Dr. Kirke Corin recommended proceeding with renal angiography and possible stenting. She came in for this procedure yesterday which demonstrated Conclusions:  1. Severe bilateral ostial renal artery stenosis.  2. Successful angioplasty and bare-metal stent placement to the ostial right renal artery. This was overall a difficult procedure due to significant atherosclerosis and calcifications in the aorta with difficulty engaging the renal arteries selectively. Dr. Kirke Corin recommended DAPT for at least 1 month. He admitted her overnight for hydration and close monitoring of blood pressure. He elected not to intervene on both renal arteries in one session due to risk of blood pressure drop.Staged angioplasty of the left renal artery will be considered based on clinical response. (The same technique will need to be used to engage the left renal artery).  Dr. Excell Seltzer has noted her hypokalemia (3.1) and hyponatremia (124) this morning. Her K was repleted. Per discussion with Dr. Excell Seltzer, will discontinue chlorthalidone due to low sodium (was asymptomatic with this) - it appears she has prior h/o hyponatremia while on HCTZ as well. WE will also hold off on daily potassium supplementation since her diuretic is being discontinued and await BMET next week. The patient will followup with [facility] in [place]. Dr. Excell Seltzer has seen and examined the patient and feels she is stable for discharge today. The patient was instructed to discuss with Dr. Kirke Corin how long she should continue her Plavix. She was also instructed to continue to monitor her BP at home and notify our office of persistently high readings.  In the record  it also appears the patient was concerned that her medications were leading to appetite loss and weight loss. I instructed her to f/u PCP for  further evaluation/monitoring of this as well.  Discharge Vitals: Blood pressure 173/52, pulse 68, temperature 97.8 F (36.6 C), temperature source Oral, resp. rate 16, height 5\' 2"  (1.575 m), weight 96 lb 9 oz (43.8 kg), SpO2 95.00%.  Labs: Lab Results  Component Value Date   WBC 5.4 12/03/2012   HGB 11.4* 12/03/2012   HCT 31.8* 12/03/2012   MCV 80.3 12/03/2012   PLT 266 12/03/2012     Recent Labs Lab 12/03/12 0425  NA 124*  K 3.1*  CL 90*  CO2 23  BUN 15  CREATININE 1.34*  CALCIUM 8.6  GLUCOSE 85   Diagnostic Studies/Procedures   PV angio this admission as above.  Discharge Medications     Medication List    STOP taking these medications       chlorthalidone 25 MG tablet  Commonly known as:  HYGROTON      TAKE these medications       acetaminophen 500 MG tablet  Commonly known as:  TYLENOL  Take 1,000 mg by mouth every 6 (six) hours as needed for pain.     albuterol-ipratropium 18-103 MCG/ACT inhaler  Commonly known as:  COMBIVENT  Inhale 2 puffs into the lungs every 6 (six) hours as needed.     aspirin EC 81 MG tablet  Take 81 mg by mouth daily.     clopidogrel 75 MG tablet  Commonly known as:  PLAVIX  Take 1 tablet (75 mg total) by mouth daily with breakfast.     diltiazem 240 MG 24 hr capsule  Commonly known as:  CARDIZEM CD  Take 1 capsule (240 mg total) by mouth daily.     hydrALAZINE 100 MG tablet  Commonly known as:  APRESOLINE  Take 1 tablet (100 mg total) by mouth 3 (three) times daily.     ipratropium 0.06 % nasal spray  Commonly known as:  ATROVENT  Place 2 sprays into the nose daily as needed. For nasal symptoms     losartan 100 MG tablet  Commonly known as:  COZAAR  Take 1 tablet (100 mg total) by mouth daily.     pantoprazole 40 MG tablet  Commonly known as:  PROTONIX  Take 40 mg by mouth daily.     PRESCRIPTION MEDICATION  Apply 1 application topically daily as needed (called "dermilome").        Disposition   The patient  will be discharged in stable condition to home. Discharge Orders   Future Appointments Provider Department Dept Phone   12/10/2012 8:40 AM [facility] Nurse Selena Batten at [facility] [phone]   12/24/2012 8:45 AM Iran Ouch, MD Selena Batten at [place] [phone]   12/24/2012 9:00 AM [facility] Nurse Selena Batten at [place] [phone]   Future Orders Complete By Expires     Diet - low sodium heart healthy  As directed     Discharge instructions  As directed     Comments:      At your follow-up appointment, please discuss with your cardiologist how long he would like you to continue Plavix. Continue to monitor your blood pressure at home and call your doctor if it continues to run high.    Increase activity slowly  As directed     Comments:      No driving for 2 days. No lifting over 5 lbs for 1  week. No sexual activity for 1 week. Keep procedure site clean & dry. If you notice increased pain, swelling, bleeding or pus, call/return!  You may shower, but no soaking baths/hot tubs/pools for 1 week.      Follow-up Information   Follow up with [facility] at [place]. (Labwork only 12/10/12 - please come anytime between 8-4 (except closed for lunch between 11:30-2))    Contact information:   [address] [place] [address] [phone]      Follow up with Lorine Bears, MD. (12/24/12 at 8:45am)    Contact information:   [address] [place] [address] [phone]       Follow up with Sampson Goon, DAVID, MD. (Follow up with PCP for general medical maintenance, including to discuss your weight loss.)    Contact information:   [facility] [address] [place] [address] [phone]         Duration of Discharge Encounter: Greater than 30 minutes including physician and PA time.  Signed, Dayna Dunn PA-C 12/03/2012, 10:28 AM

## 2012-12-07 ENCOUNTER — Telehealth: Payer: Self-pay

## 2012-12-07 NOTE — Telephone Encounter (Signed)
Scheduled outpt procedure Not a TCM pt

## 2012-12-07 NOTE — Telephone Encounter (Signed)
Message copied by Marcelle Overlie on Mon Dec 07, 2012  8:11 AM ------      Message from: Gerome Apley      Created: Fri Dec 04, 2012  5:41 PM       Please call on 4/7 TCM patient.            Thanks ------

## 2012-12-08 ENCOUNTER — Ambulatory Visit: Payer: Self-pay | Admitting: Internal Medicine

## 2012-12-09 LAB — CBC CANCER CENTER
Basophil %: 1.5 %
HCT: 37.6 % (ref 35.0–47.0)
Lymphocyte #: 0.7 x10 3/mm — ABNORMAL LOW (ref 1.0–3.6)
MCHC: 33.9 g/dL (ref 32.0–36.0)
MCV: 86 fL (ref 80–100)
Neutrophil %: 80.5 %
RBC: 4.35 10*6/uL (ref 3.80–5.20)
RDW: 16.2 % — ABNORMAL HIGH (ref 11.5–14.5)
WBC: 8 x10 3/mm (ref 3.6–11.0)

## 2012-12-09 LAB — CREATININE, SERUM
Creatinine: 1.5 mg/dL — ABNORMAL HIGH (ref 0.60–1.30)
EGFR (African American): 40 — ABNORMAL LOW
EGFR (Non-African Amer.): 34 — ABNORMAL LOW

## 2012-12-09 LAB — CALCIUM: Calcium, Total: 8.7 mg/dL (ref 8.5–10.1)

## 2012-12-09 LAB — HEPATIC FUNCTION PANEL A (ARMC)
Alkaline Phosphatase: 71 U/L (ref 50–136)
Bilirubin, Direct: 0.3 mg/dL — ABNORMAL HIGH (ref 0.00–0.20)
Bilirubin,Total: 0.7 mg/dL (ref 0.2–1.0)

## 2012-12-10 ENCOUNTER — Ambulatory Visit (INDEPENDENT_AMBULATORY_CARE_PROVIDER_SITE_OTHER): Payer: Medicare Other

## 2012-12-10 DIAGNOSIS — Z79899 Other long term (current) drug therapy: Secondary | ICD-10-CM

## 2012-12-10 DIAGNOSIS — I739 Peripheral vascular disease, unspecified: Secondary | ICD-10-CM

## 2012-12-10 DIAGNOSIS — R0602 Shortness of breath: Secondary | ICD-10-CM

## 2012-12-11 LAB — BASIC METABOLIC PANEL
GFR calc non Af Amer: 44 mL/min/{1.73_m2} — ABNORMAL LOW (ref >59)
Glucose: 95 mg/dL (ref 65–99)
Sodium: 124 mmol/L — ABNORMAL LOW (ref 134–144)

## 2012-12-24 ENCOUNTER — Other Ambulatory Visit (INDEPENDENT_AMBULATORY_CARE_PROVIDER_SITE_OTHER): Payer: Medicare Other

## 2012-12-24 ENCOUNTER — Ambulatory Visit (INDEPENDENT_AMBULATORY_CARE_PROVIDER_SITE_OTHER): Payer: Medicare Other | Admitting: Cardiovascular Disease

## 2012-12-24 ENCOUNTER — Encounter: Payer: Self-pay | Admitting: Cardiovascular Disease

## 2012-12-24 VITALS — BP 191/70 | HR 82 | Ht 62.0 in | Wt 101.5 lb

## 2012-12-24 DIAGNOSIS — I701 Atherosclerosis of renal artery: Secondary | ICD-10-CM

## 2012-12-24 DIAGNOSIS — I739 Peripheral vascular disease, unspecified: Secondary | ICD-10-CM

## 2012-12-24 DIAGNOSIS — I10 Essential (primary) hypertension: Secondary | ICD-10-CM

## 2012-12-24 DIAGNOSIS — R0602 Shortness of breath: Secondary | ICD-10-CM

## 2012-12-24 DIAGNOSIS — R0989 Other specified symptoms and signs involving the circulatory and respiratory systems: Secondary | ICD-10-CM

## 2012-12-24 DIAGNOSIS — E785 Hyperlipidemia, unspecified: Secondary | ICD-10-CM

## 2012-12-24 NOTE — Patient Instructions (Addendum)
Labs today.  Continue same medication. Plavix can be stopped after 01/01/2013 Follow up in 2 month.

## 2012-12-24 NOTE — Progress Notes (Signed)
HPI  This is a 72 year old female who is here today for a followup visit regarding refractory hypertension. She has no previous cardiac history. 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 Since July, 2013 , she started experiencing lower extremity edema which was worse on the left side associated with increased dyspnea especially in the morning with no orthopnea or PND.  She underwent cardiac evaluation which included a nuclear stress test which showed no evidence of ischemia. Echocardiogram showed normal LV systolic function with mild diastolic dysfunction and borderline pulmonary hypertension. It was felt that her dyspnea was due to COPD. Lower extremity edema was thought to be due to amlodipine. She has refractory hypertension with intolerance to multiple medications. Renal artery duplex ultrasound showed significant right renal artery stenosis and moderate left renal artery stenosis.  She underwent renal artery angiography with showed severe bilateral ostial renal artery stenosis with extensive atherosclerosis in the abdominal aorta. I performed stenting on right renal artery without complications. BP reading at home are improved but still not optimal.   Allergies  Allergen Reactions  . Trimethobenzamide Other (See Comments)    Eye allergy, whites of eyes turned red, for about two weeks, there after.    . Ceftin (Cefuroxime Axetil) Other (See Comments)    Unknown   . Chlorthalidone     hyponatremia  . Hctz (Hydrochlorothiazide)     Hyponatremia   . Tequin (Gatifloxacin) Itching     Current Outpatient Prescriptions on File Prior to Visit  Medication Sig Dispense Refill  . acetaminophen (TYLENOL) 500 MG tablet Take 1,000 mg by mouth every 6 (six) hours as needed for pain.       Marland Kitchen albuterol-ipratropium (COMBIVENT) 18-103 MCG/ACT inhaler Inhale 2 puffs into the lungs every 6 (six) hours as needed.      Marland Kitchen aspirin EC 81 MG tablet Take 81 mg by mouth as needed.       . clopidogrel (PLAVIX) 75 MG tablet Take 1 tablet (75 mg total) by mouth daily with breakfast.  30 tablet  1  . diltiazem (CARDIZEM CD) 240 MG 24 hr capsule Take 1 capsule (240 mg total) by mouth daily.  30 capsule  6  . hydrALAZINE (APRESOLINE) 100 MG tablet Take 1 tablet (100 mg total) by mouth 3 (three) times daily.  90 tablet  3  . losartan (COZAAR) 100 MG tablet Take 1 tablet (100 mg total) by mouth daily.  90 tablet  3  . pantoprazole (PROTONIX) 40 MG tablet Take 40 mg by mouth daily.      Marland Kitchen PRESCRIPTION MEDICATION Apply 1 application topically daily as needed (called "dermilome").       No current facility-administered medications on file prior to visit.     Past Medical History  Diagnosis Date  . Asthma   . Osteoporosis   . COPD (chronic obstructive pulmonary disease)   . Peripheral vascular disease     a. h/o iliac stents 2010. b. RAS as below.  . History of skin ulcer   . Hypertension   . GERD (gastroesophageal reflux disease)   . Renal artery stenosis     a. s/p complex PTA/R renal stent 12/2012 (decision for staged angioplasty of L renal artery will be based on clinical response).  . Chronic kidney disease (CKD), stage II (mild)     /  notes 12/02/2012  . Squamous cell carcinoma of oropharynx 2008     status post chemotherapy and radiation therapy in 2008  . Cancer of esophagus 2008    s/p chemo and radiation  . History of stomach ulcers   . Daily headache     "related to high blood pressure" (12/02/2012)  . Arthritis   . Hyponatremia     Previously related to HCTZ/chlorthalidone     Past Surgical History  Procedure Laterality Date  . Peripheral arterial stent graft Bilateral     x2 STENTS BOTH LEGS  . Anterior cervical decomp/discectomy fusion  ~ 2006    "put in spacer"  (12/02/2012)  . Portacath placement  2008  . Anterior cervical decomp/discectomy fusion  10/07/2012    Procedure: ANTERIOR CERVICAL DECOMPRESSION/DISCECTOMY FUSION 2 LEVEL/HARDWARE REMOVAL;  Surgeon: Cristi Loron, MD;  Location: [facility];  Service: Neurosurgery;  Laterality: N/A;  Cervical three-four, cervical four-five Anterior cervical decompression/diskectomy/fusion/interbody plating and prosthesis/removal of spinal concept plate  . Cataract extraction w/ intraocular lens  implant, bilateral  ~2005  . Renal artery stent Right 12/02/2012    Successful angioplasty and bare-metal stent placement to the ostial right renal artery  . Tonsillectomy  ~ 1946  . Breast cyst excision Right ?1980's    negative     History reviewed. No pertinent family history.   History   Social History  . Marital Status: Widowed    Spouse Name: N/A    Number of Children: N/A  . Years of Education: N/A   Occupational History  . Not on file.   Social History Main Topics  . Smoking status: Current Every Day Smoker -- 0.25 packs/day for 50 years    Types: Cigarettes  . Smokeless tobacco: Never Used     Comment: 12/02/2012 offered smoking cessation materials; pt declines  . Alcohol Use: 3.0 oz/week    5 Glasses of wine per week     Comment: 12/02/2012 "4-7 glasses sherry/wk"  . Drug Use: No  . Sexually Active: Not Currently   Other Topics Concern  . Not on file   Social History Narrative  . No narrative on file      PHYSICAL EXAM   BP 191/70  Pulse 82  Ht 5\' 2"  (1.575 m)  Wt 101 lb 8 oz (46.04 kg)  BMI 18.56 kg/m2 Constitutional: She is oriented to person, place, and time. She appears well-developed and well-nourished. No distress.  HENT: No nasal discharge.  Head: Normocephalic and atraumatic.  Eyes: Pupils are equal and round. Right eye exhibits no discharge. Left eye exhibits no discharge.  Neck: Normal range of motion. Neck supple. No JVD present. No thyromegaly present.    Cardiovascular: Normal rate, regular rhythm, normal heart sounds. Exam reveals no gallop and no friction rub. No murmur heard.  Pulmonary/Chest: Effort normal and breath sounds normal. No stridor. No respiratory distress. She has no wheezes. She has no rales. She exhibits no tenderness.  Abdominal: Soft. Bowel sounds are normal. She exhibits no distension. There is no tenderness. There is no rebound and no guarding.  Musculoskeletal: Normal range of motion. She exhibits +1 edema and no tenderness.  Neurological: She is alert and oriented to person, place, and time. Coordination normal.  Skin: Skin is warm and dry. No rash noted. She is not diaphoretic. No erythema. No pallor.  Psychiatric: She has a normal mood and affect. Her behavior is normal. Judgment and thought content normal.  Left groin: no hematoma  [id number]  Rhythm  -Right atrial enlargement.   -Old inferior infarct  -Old anterior infarct.   -  Nonspecific T-abnormality.   Right atrial abnormality and rotation -possible pulmonary disease.   ABNORMAL    ASSESSMENT AND PLAN

## 2012-12-25 LAB — BASIC METABOLIC PANEL
BUN/Creatinine Ratio: 13 (ref 11–26)
BUN: 14 mg/dL (ref 8–27)
CO2: 17 mmol/L — ABNORMAL LOW (ref 19–28)
Creatinine, Ser: 1.07 mg/dL — ABNORMAL HIGH (ref 0.57–1.00)
GFR calc Af Amer: 60 mL/min/{1.73_m2} (ref >59)

## 2012-12-25 NOTE — Assessment & Plan Note (Addendum)
Blood pressure improved after recent renal artery stenting but still no optimal. There is still severe ostial left renal artery stenosis. I recommend proceeding with left renal artery stenting but she wants to wait. The indication for stenting is refractory hypertension and CKD.  Continue same medications. Recheck BMP today due to recent hyponatremia.  Plavix can be stopped after finishing 1 month.

## 2012-12-25 NOTE — Assessment & Plan Note (Signed)
Status post iliac stenting in 2010. She had an ABI done which was moderately reduced on the right side. Aortoiliac duplex showed patent stents without significant restenosis. Continue medical therapy.  She is not interested in smoking cessation.

## 2012-12-25 NOTE — Assessment & Plan Note (Signed)
Due to hyperlipidemia as well as underlying peripheral arterial disease and renal artery stenosis, she was started on  atorvastatin 10 mg daily. However, she stopped the medication on her own due to concerns about possible side effects. She does not want any statin.

## 2012-12-30 ENCOUNTER — Ambulatory Visit: Payer: Self-pay | Admitting: Internal Medicine

## 2012-12-31 ENCOUNTER — Ambulatory Visit: Payer: Self-pay | Admitting: Internal Medicine

## 2013-01-28 ENCOUNTER — Ambulatory Visit (INDEPENDENT_AMBULATORY_CARE_PROVIDER_SITE_OTHER): Payer: Medicare Other

## 2013-01-28 ENCOUNTER — Telehealth: Payer: Self-pay

## 2013-01-28 VITALS — BP 118/50 | HR 84 | Ht 62.0 in | Wt 99.5 lb

## 2013-01-28 DIAGNOSIS — I951 Orthostatic hypotension: Secondary | ICD-10-CM

## 2013-01-28 MED ORDER — HYDRALAZINE HCL 100 MG PO TABS: 50.0000 mg | ORAL_TABLET | Freq: Three times a day (TID) | ORAL | Status: DC

## 2013-01-28 NOTE — Telephone Encounter (Signed)
Do CBC, BMP. Agree with your evaluation.

## 2013-01-28 NOTE — Patient Instructions (Addendum)
Labs today- we will call you with further instructions.  Decrease hydralazine to 50 mg (1/2 tablet) 3 times daily  Try to drink more water than soda/stay well hydrated  Change positions slowly

## 2013-01-28 NOTE — Telephone Encounter (Signed)
See below FYI Let me know if I need to do something different thanks

## 2013-01-28 NOTE — Telephone Encounter (Signed)
Pt called to tell me the last time she checked her BP was 5/20. At that time it was 183/67. She then skipped a few days and checked it again 5/26. At that time it was 126/51 5/27=121/52 5/28=108/48 Today=80/47 HR=116 BPM this am  She took meds as prescribed this am (hydralazine 100 mg TID, losartan 100 mg daily, diltiazem 240 mg daily) C/o some dizziness and mild sob Denies active bleeding in urine/BM; no coughing up blood or spitting up blood Has stopped Plavix (ok per Dr. Kirke Corin at last OV) Denies vomiting. Has diarrhea but says "I have this all the time" She thinks she has been drinking enough fluid  I advised her to come in for BP check, EKG and possible labs. I also asked that she bring home Bp cuff to compare readings She tells me car is in shop and should be able to come to office today by 4. If it will be later than this she is to call me back Otherwise I advised her to push fluids and hold hydralazine until we can see her Understanding verb

## 2013-01-28 NOTE — Progress Notes (Signed)
Pt c/o dizziness earlier this am with low BP and elevated HR Says she is feeling better this afternoon Confirms compliance with medications as prescribed Held PM dose of hydralazine per our instruction  Orthostatic BPs obtained They are as follows: Supine=118/50, HR=96 Sitting=100/60, HR=60 Standing=100/60, HR=100 Her BP cuff in office=125/55  EKG showed NSR with a rate of 84 BPM In no acute distress  I advised, per Dr. Jari Sportsman suggestion, we will get BMP and CBC and will call her with results I advised she change positions slowly since BP does drop slightly with position change She admits to drinking more soda than she does water. I advised she try drinking more water to stay well hydrated I also advised she decrease hydralazine to 50 mg TID Understanding verb

## 2013-01-29 LAB — BASIC METABOLIC PANEL
BUN/Creatinine Ratio: 14 (ref 11–26)
BUN: 17 mg/dL (ref 8–27)
CO2: 14 mmol/L — ABNORMAL LOW (ref 19–28)
Creatinine, Ser: 1.23 mg/dL — ABNORMAL HIGH (ref 0.57–1.00)
GFR calc Af Amer: 50 mL/min/{1.73_m2} — ABNORMAL LOW (ref >59)
Glucose: 89 mg/dL (ref 65–99)

## 2013-01-29 LAB — CBC WITH DIFFERENTIAL
Basos: 0 % (ref 0–3)
Eos: 0 % (ref 0–5)
Hemoglobin: 12.9 g/dL (ref 11.1–15.9)
Immature Grans (Abs): 0 10*3/uL (ref 0.0–0.1)
Immature Granulocytes: 0 % (ref 0–2)
MCH: 32.5 pg (ref 26.6–33.0)
Monocytes Absolute: 0.8 10*3/uL (ref 0.1–0.9)
Monocytes: 10 % (ref 4–12)
Neutrophils Relative %: 78 % — ABNORMAL HIGH (ref 40–74)
RBC: 3.97 x10E6/uL (ref 3.77–5.28)
WBC: 8.3 10*3/uL (ref 3.4–10.8)

## 2013-01-29 NOTE — Progress Notes (Signed)
lmtcb

## 2013-01-31 ENCOUNTER — Ambulatory Visit: Payer: Self-pay | Admitting: Internal Medicine

## 2013-02-01 NOTE — Progress Notes (Signed)
Pt informed of stable kidney function and no anemia.

## 2013-02-23 ENCOUNTER — Ambulatory Visit: Payer: Medicare Other | Admitting: Cardiovascular Disease

## 2013-03-04 ENCOUNTER — Other Ambulatory Visit: Payer: Self-pay

## 2013-03-04 MED ORDER — DILTIAZEM HCL ER COATED BEADS 240 MG PO CP24: 240.0000 mg | ORAL_CAPSULE | Freq: Every day | ORAL | Status: DC

## 2013-03-18 ENCOUNTER — Ambulatory Visit (INDEPENDENT_AMBULATORY_CARE_PROVIDER_SITE_OTHER): Payer: Medicare Other | Admitting: Cardiovascular Disease

## 2013-03-18 ENCOUNTER — Encounter: Payer: Self-pay | Admitting: Cardiovascular Disease

## 2013-03-18 VITALS — BP 157/72 | HR 84 | Ht 62.0 in | Wt 95.5 lb

## 2013-03-18 DIAGNOSIS — E049 Nontoxic goiter, unspecified: Secondary | ICD-10-CM

## 2013-03-18 DIAGNOSIS — E0590 Thyrotoxicosis, unspecified without thyrotoxic crisis or storm: Secondary | ICD-10-CM

## 2013-03-18 DIAGNOSIS — R42 Dizziness and giddiness: Secondary | ICD-10-CM

## 2013-03-18 DIAGNOSIS — I10 Essential (primary) hypertension: Secondary | ICD-10-CM

## 2013-03-18 DIAGNOSIS — R5383 Other fatigue: Secondary | ICD-10-CM

## 2013-03-18 DIAGNOSIS — R5381 Other malaise: Secondary | ICD-10-CM

## 2013-03-18 DIAGNOSIS — I701 Atherosclerosis of renal artery: Secondary | ICD-10-CM

## 2013-03-18 DIAGNOSIS — I739 Peripheral vascular disease, unspecified: Secondary | ICD-10-CM

## 2013-03-18 DIAGNOSIS — R0602 Shortness of breath: Secondary | ICD-10-CM

## 2013-03-18 NOTE — Assessment & Plan Note (Signed)
BP is significantly better than before. Continue current medications.

## 2013-03-18 NOTE — Assessment & Plan Note (Signed)
Status post iliac stenting in 2010. She had an ABI done which was moderately reduced on the right side. Aortoiliac duplex showed patent stents without significant restenosis. Continue medical therapy.  She is not interested in smoking cessation.  She reports increased claudication and will readdress this after managing her thyroid issues.

## 2013-03-18 NOTE — Assessment & Plan Note (Signed)
Blood pressure improved after right renal artery stenting. There is still severe ostial left renal artery stenosis. However, due to concerns about hyperthyroidism, any procedure that involve giving contrast Is not recommended at this time. Given that his blood pressure is reasonably controlled, there is no urgency in proceeding with this.

## 2013-03-18 NOTE — Patient Instructions (Addendum)
Labs today.   Schedule thyroid ultrasound at [facility] for goiter.   Follow up in 3 months.

## 2013-03-18 NOTE — Assessment & Plan Note (Signed)
She has obvious thyromegaly on physical exam associated with difficulty swallowing. She also has symptoms highly suggestive of hyperthyroidism and possibly has thyroiditis. I will check routine labs including TSH, T3 and T4. I also requested a thyroid ultrasound for evaluation. Most likely she will need to be referred to endocrinology.

## 2013-03-18 NOTE — Progress Notes (Signed)
HPI  This is a 72 year old female who is here today for a followup visit regarding refractory hypertension. She has no previous cardiac history. 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 Since July, 2013 , she started experiencing lower extremity edema which was worse on the left side associated with increased dyspnea especially in the morning with no orthopnea or PND.  She underwent cardiac evaluation which included a nuclear stress test which showed no evidence of ischemia. Echocardiogram showed normal LV systolic function with mild diastolic dysfunction and borderline pulmonary hypertension. It was felt that her dyspnea was due to COPD. Lower extremity edema was thought to be due to amlodipine. She has refractory hypertension with intolerance to multiple medications. Renal artery duplex ultrasound showed significant right renal artery stenosis and moderate left renal artery stenosis.  She underwent renal artery angiography in April 2014 with showed severe bilateral ostial renal artery stenosis with extensive atherosclerosis in the abdominal aorta. I performed stenting on right renal artery without complications. Blood pressure improved since that time. The plan was to proceed with staged stent placement to the left renal artery. However, today she reports 10 pound weight loss with associated diarrhea and generalized weakness. She feels nervous and has noted swelling in her neck with difficulty swallowing. She complains of pain in both legs with walking as well.   Allergies  Allergen Reactions  . Trimethobenzamide Other (See Comments)    Eye allergy, whites of eyes turned red, for about two weeks, there after.    . Ceftin (Cefuroxime Axetil) Other (See Comments)    Unknown   .  Chlorthalidone     hyponatremia  . Hctz (Hydrochlorothiazide)     Hyponatremia   . Tequin (Gatifloxacin) Itching     Current Outpatient Prescriptions on File Prior to Visit  Medication Sig Dispense Refill  . acetaminophen (TYLENOL) 500 MG tablet Take 1,000 mg by mouth every 6 (six) hours as needed for pain.      Marland Kitchen albuterol-ipratropium (COMBIVENT) 18-103 MCG/ACT inhaler Inhale 2 puffs into the lungs every 6 (six) hours as needed.      Marland Kitchen aspirin EC 81 MG tablet Take 81 mg by mouth as needed.       . diltiazem (CARDIZEM CD) 240 MG 24 hr capsule Take 1 capsule (240 mg total) by mouth daily.  30 capsule  6  . hydrALAZINE (APRESOLINE) 100 MG tablet Take 0.5 tablets (50 mg total) by mouth 3 (three) times daily.  90 tablet  3  . losartan (COZAAR) 100 MG tablet Take 1 tablet (100 mg total) by mouth daily.  90 tablet  3  . pantoprazole (PROTONIX) 40 MG tablet Take 40 mg by mouth daily.      Marland Kitchen PRESCRIPTION MEDICATION Apply 1 application topically daily as needed (called "dermilome").       No current facility-administered medications on file prior to visit.     Past Medical History  Diagnosis Date  . Asthma   . Osteoporosis   . COPD (chronic obstructive pulmonary disease)   . Peripheral vascular disease     a. h/o iliac stents 2010. b. RAS as below.  . History of skin ulcer   . Hypertension   . GERD (gastroesophageal reflux disease)   . Renal artery stenosis     a. s/p complex PTA/R renal  stent 12/2012 (decision for staged angioplasty of L renal artery will be based on clinical response).  . Chronic kidney disease (CKD), stage II (mild)     Hattie Perch 12/02/2012  . Squamous cell carcinoma of oropharynx 2008     status post chemotherapy and radiation therapy in 2008  . Cancer of esophagus 2008    s/p chemo and radiation  . History of stomach ulcers   . Daily headache     "related to high blood pressure" (12/02/2012)  . Arthritis   . Hyponatremia     Previously related to  HCTZ/chlorthalidone     Past Surgical History  Procedure Laterality Date  . Peripheral arterial stent graft Bilateral     x2 STENTS BOTH LEGS  . Anterior cervical decomp/discectomy fusion  ~ 2006    "put in spacer" (12/02/2012)  . Portacath placement  2008  . Anterior cervical decomp/discectomy fusion  10/07/2012    Procedure: ANTERIOR CERVICAL DECOMPRESSION/DISCECTOMY FUSION 2 LEVEL/HARDWARE REMOVAL;  Surgeon: Cristi Loron, MD;  Location: [facility];  Service: Neurosurgery;  Laterality: N/A;  Cervical three-four, cervical four-five Anterior cervical decompression/diskectomy/fusion/interbody plating and prosthesis/removal of spinal concept plate  . Cataract extraction w/ intraocular lens  implant, bilateral  ~2005  . Renal artery stent Right 12/02/2012    Successful angioplasty and bare-metal stent placement to the ostial right renal artery  . Tonsillectomy  ~ 1946  . Breast cyst excision Right ?1980's    negative     History reviewed. No pertinent family history.   History   Social History  . Marital Status: Widowed    Spouse Name: N/A    Number of Children: N/A  . Years of Education: N/A   Occupational History  . Not on file.   Social History Main Topics  . Smoking status: Current Every Day Smoker -- 0.25 packs/day for 50 years    Types: Cigarettes  . Smokeless tobacco: Never Used     Comment: 12/02/2012 offered smoking cessation materials; pt declines  . Alcohol Use: 3.0 oz/week    5 Glasses of wine per week     Comment: 12/02/2012 "4-7 glasses sherry/wk"  . Drug Use: No  . Sexually Active: Not Currently   Other Topics Concern  . Not on file   Social History Narrative  . No narrative on file      PHYSICAL EXAM   BP 157/72  Pulse 84  Ht 5\' 2"  (1.575 m)  Wt 95 lb 8 oz (43.319 kg)  BMI 17.46 kg/m2 Constitutional: She is oriented to person, place, and time. She appears well-developed and well-nourished. No distress.  HENT: No nasal discharge.  Head:  Normocephalic and atraumatic.  Eyes: Pupils are equal and round. Right eye exhibits no discharge. Left eye exhibits no discharge.  Neck: Normal range of motion. Neck supple. No JVD present. No thyromegaly present.  Cardiovascular: Normal rate, regular rhythm, normal heart sounds. Exam reveals no gallop and no friction rub. No murmur heard.  Pulmonary/Chest: Effort normal and breath sounds normal. No stridor. No respiratory distress. She has no wheezes. She has no rales. She exhibits no tenderness.  Abdominal: Soft. Bowel sounds are normal. She exhibits no distension. There is no tenderness. There is no rebound and no guarding.  Musculoskeletal: Normal range of motion. She exhibits +1 edema and no tenderness.  Neurological: She is alert and oriented to person, place, and time. Coordination normal.  Skin: Skin is warm and dry. No rash noted. She is not diaphoretic. No erythema.  No pallor.  Psychiatric: She has a normal mood and affect. Her behavior is normal. Judgment and thought content normal.    ASSESSMENT AND PLAN

## 2013-03-19 LAB — BASIC METABOLIC PANEL
CO2: 16 mmol/L — ABNORMAL LOW (ref 18–29)
Calcium: 9.1 mg/dL (ref 8.6–10.2)
Chloride: 104 mmol/L (ref 97–108)
GFR calc non Af Amer: 41 mL/min/{1.73_m2} — ABNORMAL LOW (ref >59)
Glucose: 91 mg/dL (ref 65–99)
Potassium: 4.9 mmol/L (ref 3.5–5.2)
Sodium: 137 mmol/L (ref 134–144)

## 2013-03-19 LAB — CBC WITH DIFFERENTIAL/PLATELET

## 2013-03-19 LAB — TSH: TSH: 2.85 u[IU]/mL (ref 0.450–4.500)

## 2013-03-19 LAB — T4, FREE: Free T4: 1.44 ng/dL (ref 0.82–1.77)

## 2013-03-19 LAB — T3, FREE: T3, Free: 2.5 pg/mL (ref 2.0–4.4)

## 2013-03-22 ENCOUNTER — Ambulatory Visit: Payer: Self-pay | Admitting: Cardiovascular Disease

## 2013-03-24 ENCOUNTER — Telehealth: Payer: Self-pay | Admitting: *Deleted

## 2013-03-24 DIAGNOSIS — E049 Nontoxic goiter, unspecified: Secondary | ICD-10-CM

## 2013-03-24 NOTE — Telephone Encounter (Signed)
I spoke with pt and she is aware of thyroid lab results and thyroid ultrasound. She is aware from ov will need endocrinology consults. States Dr. Maryruth Hancock told her [facility] endocrinology.  Endocrinology referral made. Pt will receive call back. Mylo Red RN

## 2013-03-25 ENCOUNTER — Ambulatory Visit: Payer: Self-pay | Admitting: Family Medicine

## 2013-04-07 ENCOUNTER — Other Ambulatory Visit: Payer: Self-pay

## 2013-04-22 ENCOUNTER — Ambulatory Visit: Payer: Self-pay | Admitting: Family Medicine

## 2013-04-22 ENCOUNTER — Emergency Department: Payer: Self-pay

## 2013-04-22 LAB — CBC
HGB: 14.7 g/dL (ref 12.0–16.0)
MCH: 34.8 pg — ABNORMAL HIGH (ref 26.0–34.0)
MCHC: 34.4 g/dL (ref 32.0–36.0)
MCV: 101 fL — ABNORMAL HIGH (ref 80–100)
Platelet: 272 10*3/uL (ref 150–440)
RBC: 4.23 10*6/uL (ref 3.80–5.20)

## 2013-04-22 LAB — BASIC METABOLIC PANEL
Anion Gap: 8 (ref 7–16)
BUN: 21 mg/dL — ABNORMAL HIGH (ref 7–18)
Chloride: 100 mmol/L (ref 98–107)
Co2: 23 mmol/L (ref 21–32)
Creatinine: 1.29 mg/dL (ref 0.60–1.30)
EGFR (Non-African Amer.): 41 — ABNORMAL LOW
Glucose: 82 mg/dL (ref 65–99)
Osmolality: 265 (ref 275–301)
Potassium: 4 mmol/L (ref 3.5–5.1)

## 2013-04-22 LAB — PROTIME-INR: Prothrombin Time: 13.1 secs (ref 11.5–14.7)

## 2013-04-23 ENCOUNTER — Ambulatory Visit: Payer: Self-pay | Admitting: Internal Medicine

## 2013-04-28 LAB — PROTIME-INR: Prothrombin Time: 19.8 secs — ABNORMAL HIGH (ref 11.5–14.7)

## 2013-05-03 ENCOUNTER — Ambulatory Visit: Payer: Self-pay | Admitting: Internal Medicine

## 2013-05-04 LAB — PROTIME-INR: INR: 2.9

## 2013-05-11 LAB — PROTIME-INR: Prothrombin Time: 36.5 secs — ABNORMAL HIGH (ref 11.5–14.7)

## 2013-05-13 LAB — PROTIME-INR: Prothrombin Time: 25.1 secs — ABNORMAL HIGH (ref 11.5–14.7)

## 2013-05-18 LAB — CBC CANCER CENTER
Basophil #: 0.1 x10 3/mm (ref 0.0–0.1)
Basophil %: 0.9 %
Eosinophil #: 0.1 x10 3/mm (ref 0.0–0.7)
Eosinophil %: 1.2 %
HCT: 43.8 % (ref 35.0–47.0)
Lymphocyte #: 1 x10 3/mm (ref 1.0–3.6)
Lymphocyte %: 16.3 %
MCHC: 33.4 g/dL (ref 32.0–36.0)
MCV: 101 fL — ABNORMAL HIGH (ref 80–100)
Platelet: 313 x10 3/mm (ref 150–440)
RBC: 4.36 10*6/uL (ref 3.80–5.20)
RDW: 14.4 % (ref 11.5–14.5)
WBC: 6.2 x10 3/mm (ref 3.6–11.0)

## 2013-05-18 LAB — CREATININE, SERUM
Creatinine: 1.24 mg/dL
EGFR (African American): 50 — ABNORMAL LOW
EGFR (Non-African Amer.): 43 — ABNORMAL LOW

## 2013-05-18 LAB — PROTIME-INR: Prothrombin Time: 22.3 secs — ABNORMAL HIGH (ref 11.5–14.7)

## 2013-06-01 LAB — PROTIME-INR: Prothrombin Time: 23.3 secs — ABNORMAL HIGH (ref 11.5–14.7)

## 2013-06-02 ENCOUNTER — Ambulatory Visit: Payer: Self-pay | Admitting: Internal Medicine

## 2013-06-14 ENCOUNTER — Other Ambulatory Visit: Payer: Self-pay | Admitting: *Deleted

## 2013-06-14 MED ORDER — LOSARTAN POTASSIUM 100 MG PO TABS: 100.0000 mg | ORAL_TABLET | Freq: Every day | ORAL | Status: DC

## 2013-06-18 ENCOUNTER — Ambulatory Visit: Payer: Medicare Other | Admitting: Cardiovascular Disease

## 2013-06-18 LAB — PROTIME-INR
INR: 2.2
Prothrombin Time: 23.6 secs — ABNORMAL HIGH (ref 11.5–14.7)

## 2013-06-18 LAB — CANCER CENTER HEMATOCRIT: HCT: 40.7 % (ref 35.0–47.0)

## 2013-06-21 ENCOUNTER — Encounter: Payer: Self-pay | Admitting: Cardiovascular Disease

## 2013-06-21 ENCOUNTER — Ambulatory Visit (INDEPENDENT_AMBULATORY_CARE_PROVIDER_SITE_OTHER): Payer: Medicare Other | Admitting: Cardiovascular Disease

## 2013-06-21 VITALS — BP 144/60 | HR 64 | Ht 62.0 in | Wt 100.5 lb

## 2013-06-21 DIAGNOSIS — I10 Essential (primary) hypertension: Secondary | ICD-10-CM

## 2013-06-21 DIAGNOSIS — I701 Atherosclerosis of renal artery: Secondary | ICD-10-CM

## 2013-06-21 DIAGNOSIS — I739 Peripheral vascular disease, unspecified: Secondary | ICD-10-CM

## 2013-06-21 NOTE — Patient Instructions (Signed)
Keep appointment for renal artery ultrasound in November.  Continue same medications.   Your physician wants you to follow-up in: 6 months.  You will receive a reminder letter in the mail two months in advance. If you don't receive a letter, please call our office to schedule the follow-up appointment.

## 2013-06-21 NOTE — Progress Notes (Signed)
HPI  This is a 72 year old female who is here today for a followup visit regarding refractory hypertension s/p renal artery stenting. She has no previous cardiac history. 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 Since July, 2013 , she started experiencing lower extremity edema which was worse on the left side associated with increased dyspnea especially in the morning with no orthopnea or PND.  She underwent cardiac evaluation which included a nuclear stress test which showed no evidence of ischemia. Echocardiogram showed normal LV systolic function with mild diastolic dysfunction and borderline pulmonary hypertension. It was felt that her dyspnea was due to COPD. Lower extremity edema was thought to be due to amlodipine. She has refractory hypertension with intolerance to multiple medications. Renal artery duplex ultrasound showed significant right renal artery stenosis and moderate left renal artery stenosis.  She underwent renal artery angiography in April 2014 with showed severe bilateral ostial renal artery stenosis with extensive atherosclerosis in the abdominal aorta. I performed stenting of right renal artery without complications. Blood pressure improved since that time. She had weight loss and thyromegaly. She was seen by endocrinology with negative work up.  She is feeling better overall.  She was diagnosed with left leg DVT few months ago and has been on warfarin.   Allergies  Allergen Reactions  . Trimethobenzamide Other (See Comments)    Eye allergy, whites of eyes turned red, for about two weeks, there after.    . Ceftin [Cefuroxime Axetil] Other (See Comments)    Unknown   . Chlorthalidone     hyponatremia  . Hctz [Hydrochlorothiazide]     Hyponatremia   .  Tequin [Gatifloxacin] Itching     Current Outpatient Prescriptions on File Prior to Visit  Medication Sig Dispense Refill  . acetaminophen (TYLENOL) 500 MG tablet Take 1,000 mg by mouth every 6 (six) hours as needed for pain.      Marland Kitchen albuterol-ipratropium (COMBIVENT) 18-103 MCG/ACT inhaler Inhale 2 puffs into the lungs every 6 (six) hours as needed.      . diltiazem (CARDIZEM CD) 240 MG 24 hr capsule Take 1 capsule (240 mg total) by mouth daily.  30 capsule  6  . hydrALAZINE (APRESOLINE) 100 MG tablet Take 0.5 tablets (50 mg total) by mouth 3 (three) times daily.  90 tablet  3  . losartan (COZAAR) 100 MG tablet Take 1 tablet (100 mg total) by mouth daily.  90 tablet  3  . pantoprazole (PROTONIX) 40 MG tablet Take 40 mg by mouth daily.      Marland Kitchen PRESCRIPTION MEDICATION Apply 1 application topically daily as needed (called "dermilome").       No current facility-administered medications on file prior to visit.     Past Medical History  Diagnosis Date  . Asthma   . Osteoporosis   . COPD (chronic obstructive pulmonary disease)   . Peripheral vascular disease     a. h/o iliac stents 2010. b. RAS as below.  . History of skin ulcer   . Hypertension   . GERD (gastroesophageal reflux disease)   . Renal artery stenosis     a. s/p complex PTA/R renal stent 12/2012 (decision for staged angioplasty of L renal artery will be based on clinical response).  . Chronic kidney disease (CKD), stage II (mild)     Hattie Perch  12/02/2012  . Squamous cell carcinoma of oropharynx 2008     status post chemotherapy and radiation therapy in 2008  . Cancer of esophagus 2008    s/p chemo and radiation  . History of stomach ulcers   . Daily headache     "related to high blood pressure" (12/02/2012)  . Arthritis   . Hyponatremia     Previously related to HCTZ/chlorthalidone  . Clotting disorder     left leg     Past Surgical History  Procedure Laterality Date  . Peripheral arterial stent graft Bilateral     x2  STENTS BOTH LEGS  . Anterior cervical decomp/discectomy fusion  ~ 2006    "put in spacer" (12/02/2012)  . Portacath placement  2008  . Anterior cervical decomp/discectomy fusion  10/07/2012    Procedure: ANTERIOR CERVICAL DECOMPRESSION/DISCECTOMY FUSION 2 LEVEL/HARDWARE REMOVAL;  Surgeon: Cristi Loron, MD;  Location: [facility];  Service: Neurosurgery;  Laterality: N/A;  Cervical three-four, cervical four-five Anterior cervical decompression/diskectomy/fusion/interbody plating and prosthesis/removal of spinal concept plate  . Cataract extraction w/ intraocular lens  implant, bilateral  ~2005  . Renal artery stent Right 12/02/2012    Successful angioplasty and bare-metal stent placement to the ostial right renal artery  . Tonsillectomy  ~ 1946  . Breast cyst excision Right ?1980's    negative     Family History  Problem Relation Age of Onset  . Family history unknown: Yes     History   Social History  . Marital Status: Widowed    Spouse Name: N/A    Number of Children: N/A  . Years of Education: N/A   Occupational History  . Not on file.   Social History Main Topics  . Smoking status: Current Every Day Smoker -- 0.25 packs/day for 50 years    Types: Cigarettes  . Smokeless tobacco: Never Used     Comment: 12/02/2012 offered smoking cessation materials; pt declines  . Alcohol Use: 3.0 oz/week    5 Glasses of wine per week     Comment: 12/02/2012 "4-7 glasses sherry/wk"  . Drug Use: No  . Sexual Activity: Not Currently   Other Topics Concern  . Not on file   Social History Narrative  . No narrative on file      PHYSICAL EXAM   BP 144/60  Pulse 64  Ht 5\' 2"  (1.575 m)  Wt 100 lb 8 oz (45.587 kg)  BMI 18.38 kg/m2 Constitutional: She is oriented to person, place, and time. She appears well-developed and well-nourished. No distress.  HENT: No nasal discharge.  Head: Normocephalic and atraumatic.  Eyes: Pupils are equal and round. Right eye exhibits no discharge.  Left eye exhibits no discharge.  Neck: Normal range of motion. Neck supple. No JVD present. No thyromegaly present.  Cardiovascular: Normal rate, regular rhythm, normal heart sounds. Exam reveals no gallop and no friction rub. No murmur heard.  Pulmonary/Chest: Effort normal and breath sounds normal. No stridor. No respiratory distress. She has no wheezes. She has no rales. She exhibits no tenderness.  Abdominal: Soft. Bowel sounds are normal. She exhibits no distension. There is no tenderness. There is no rebound and no guarding.  Musculoskeletal: Normal range of motion. She exhibits +1 edema and no tenderness.  Neurological: She is alert and oriented to person, place, and time. Coordination normal.  Skin: Skin is warm and dry. No rash noted. She is not diaphoretic. No erythema. No pallor.  Psychiatric: She has a normal mood and  affect. Her behavior is normal. Judgment and thought content normal.   [id number]  Rhythm  -Incomplete right bundle branch block.   -  Nonspecific T-abnormality.   ABNORMAL   ASSESSMENT AND PLAN

## 2013-06-23 ENCOUNTER — Encounter: Payer: Self-pay | Admitting: Cardiovascular Disease

## 2013-06-23 NOTE — Assessment & Plan Note (Signed)
Blood pressure improved after right renal artery stenting. There is still severe ostial left renal artery stenosis. She is due to renal artery duplex.  Given that BP is reasonably controlled, I will likely continue with medical therapy for now and reserve revascularization for uncontrolled hypertension or CKD.

## 2013-06-23 NOTE — Assessment & Plan Note (Signed)
Status post iliac stenting in 2010. She had an ABI done which was moderately reduced on the right side. Aortoiliac duplex showed patent stents without significant restenosis. Continue medical therapy.  WE again talked about smoking cessation.

## 2013-06-29 LAB — PROTIME-INR: Prothrombin Time: 20.6 secs — ABNORMAL HIGH (ref 11.5–14.7)

## 2013-07-03 ENCOUNTER — Ambulatory Visit: Payer: Self-pay | Admitting: Internal Medicine

## 2013-07-08 ENCOUNTER — Other Ambulatory Visit: Payer: Self-pay

## 2013-07-09 ENCOUNTER — Encounter (INDEPENDENT_AMBULATORY_CARE_PROVIDER_SITE_OTHER): Payer: Medicare Other

## 2013-07-09 DIAGNOSIS — I739 Peripheral vascular disease, unspecified: Secondary | ICD-10-CM

## 2013-07-09 DIAGNOSIS — I10 Essential (primary) hypertension: Secondary | ICD-10-CM

## 2013-07-09 DIAGNOSIS — I701 Atherosclerosis of renal artery: Secondary | ICD-10-CM

## 2013-07-12 ENCOUNTER — Telehealth: Payer: Self-pay

## 2013-07-12 NOTE — Telephone Encounter (Signed)
Message copied by Marilynne Halsted on Mon Jul 12, 2013  4:27 PM ------      Message from: Lorine Bears A      Created: Sun Jul 11, 2013 12:54 PM       Patent renal stent with no significant narrowing. Continue same medications. ------

## 2013-07-12 NOTE — Telephone Encounter (Signed)
Spoke w/ pt.  She is aware of results and agreeable to plan.

## 2013-07-13 LAB — CANCER CENTER HEMATOCRIT: HCT: 44.6 % (ref 35.0–47.0)

## 2013-08-02 ENCOUNTER — Ambulatory Visit: Payer: Self-pay | Admitting: Internal Medicine

## 2013-08-10 LAB — CANCER CENTER HEMATOCRIT: HCT: 46.1 % (ref 35.0–47.0)

## 2013-08-10 LAB — PROTIME-INR: Prothrombin Time: 27.9 secs — ABNORMAL HIGH (ref 11.5–14.7)

## 2013-08-12 ENCOUNTER — Telehealth: Payer: Self-pay | Admitting: *Deleted

## 2013-08-13 ENCOUNTER — Ambulatory Visit: Payer: Self-pay | Admitting: Neurosurgery

## 2013-08-24 LAB — PROTIME-INR: INR: 5.1

## 2013-08-31 LAB — PROTIME-INR: INR: 2.5

## 2013-09-02 ENCOUNTER — Ambulatory Visit: Payer: Self-pay | Admitting: Internal Medicine

## 2013-09-07 LAB — CBC CANCER CENTER
BASOS ABS: 0.1 x10 3/mm (ref 0.0–0.1)
Basophil %: 1 %
EOS PCT: 1.6 %
Eosinophil #: 0.1 x10 3/mm (ref 0.0–0.7)
HCT: 43.9 % (ref 35.0–47.0)
HGB: 14.3 g/dL (ref 12.0–16.0)
LYMPHS PCT: 12.5 %
Lymphocyte #: 0.9 x10 3/mm — ABNORMAL LOW (ref 1.0–3.6)
MCH: 30.8 pg (ref 26.0–34.0)
MCHC: 32.6 g/dL (ref 32.0–36.0)
MCV: 95 fL (ref 80–100)
MONOS PCT: 8.2 %
Monocyte #: 0.6 x10 3/mm (ref 0.2–0.9)
NEUTROS ABS: 5.7 x10 3/mm (ref 1.4–6.5)
NEUTROS PCT: 76.7 %
Platelet: 326 x10 3/mm (ref 150–440)
RBC: 4.65 10*6/uL (ref 3.80–5.20)
RDW: 17.6 % — AB (ref 11.5–14.5)
WBC: 7.4 x10 3/mm (ref 3.6–11.0)

## 2013-09-07 LAB — HEPATIC FUNCTION PANEL A (ARMC)
ALBUMIN: 4.1 g/dL (ref 3.4–5.0)
Alkaline Phosphatase: 80 U/L
BILIRUBIN DIRECT: 0.2 mg/dL (ref 0.00–0.20)
Bilirubin,Total: 0.5 mg/dL (ref 0.2–1.0)
SGOT(AST): 19 U/L (ref 15–37)
SGPT (ALT): 18 U/L (ref 12–78)
Total Protein: 7.3 g/dL (ref 6.4–8.2)

## 2013-09-07 LAB — PROTIME-INR
INR: 2.9
PROTHROMBIN TIME: 29.5 s — AB (ref 11.5–14.7)

## 2013-09-07 LAB — CALCIUM: CALCIUM: 10.1 mg/dL (ref 8.5–10.1)

## 2013-09-07 LAB — CREATININE, SERUM
Creatinine: 1.46 mg/dL — ABNORMAL HIGH (ref 0.60–1.30)
EGFR (Non-African Amer.): 35 — ABNORMAL LOW
GFR CALC AF AMER: 41 — AB

## 2013-09-27 ENCOUNTER — Other Ambulatory Visit: Payer: Self-pay | Admitting: *Deleted

## 2013-09-27 MED ORDER — DILTIAZEM HCL ER COATED BEADS 240 MG PO CP24: 240.0000 mg | ORAL_CAPSULE | Freq: Every day | ORAL | Status: DC

## 2013-09-27 NOTE — Telephone Encounter (Signed)
Requested Prescriptions   Signed Prescriptions Disp Refills  . diltiazem (CARDIZEM CD) 240 MG 24 hr capsule 30 capsule 6    Sig: Take 1 capsule (240 mg total) by mouth daily.    Authorizing Provider: Kathlyn Sacramento A    Ordering User: Britt Bottom

## 2013-10-03 ENCOUNTER — Ambulatory Visit: Payer: Self-pay

## 2013-10-03 ENCOUNTER — Ambulatory Visit: Payer: Self-pay | Admitting: Internal Medicine

## 2013-12-13 ENCOUNTER — Ambulatory Visit: Payer: Self-pay | Admitting: Family Medicine

## 2013-12-23 ENCOUNTER — Ambulatory Visit: Payer: Commercial Managed Care - HMO | Admitting: Cardiovascular Disease

## 2014-01-04 ENCOUNTER — Encounter: Payer: Self-pay | Admitting: Cardiovascular Disease

## 2014-01-04 ENCOUNTER — Ambulatory Visit (INDEPENDENT_AMBULATORY_CARE_PROVIDER_SITE_OTHER): Payer: Medicare HMO | Admitting: Cardiovascular Disease

## 2014-01-04 VITALS — BP 146/54 | HR 64 | Ht 62.0 in | Wt 98.5 lb

## 2014-01-04 DIAGNOSIS — I701 Atherosclerosis of renal artery: Secondary | ICD-10-CM

## 2014-01-04 DIAGNOSIS — I10 Essential (primary) hypertension: Secondary | ICD-10-CM

## 2014-01-04 DIAGNOSIS — R0602 Shortness of breath: Secondary | ICD-10-CM

## 2014-01-04 DIAGNOSIS — I739 Peripheral vascular disease, unspecified: Secondary | ICD-10-CM

## 2014-01-04 NOTE — Patient Instructions (Signed)
Your physician has requested that you have a lower or upper extremity arterial doppler. There are no restrictions or special instructions.  Your physician wants you to follow-up in: 6 months. You will receive a reminder letter in the mail two months in advance. If you don't receive a letter, please call our office to schedule the follow-up appointment.  Your physician recommends that you continue on your current medications as directed. Please refer to the Current Medication list given to you today.

## 2014-01-04 NOTE — Assessment & Plan Note (Signed)
Blood pressure improved after right renal artery stenting. There is still  ostial left renal artery stenosis. renal artery duplex showed patent stent on the right side and moderate disease on the left side.  Given that BP is reasonably controlled, I I recommend continuing medical therapy.

## 2014-01-04 NOTE — Assessment & Plan Note (Addendum)
Blood pressure is reasonably controlled on current medications. 

## 2014-01-04 NOTE — Assessment & Plan Note (Signed)
She is complaining of worsening claudication with previous iliac stenting. I requested lower extremity arterial Doppler.

## 2014-01-04 NOTE — Progress Notes (Signed)
HPI  This is a 73 year old female who is here today for a followup visit regarding refractory hypertension s/p renal artery stenting. She has no previous cardiac history. 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  Previous cardiac evaluation in 2013 for dyspnea included  a nuclear stress test which showed no evidence of ischemia. Echocardiogram showed normal LV systolic function with mild diastolic dysfunction and borderline pulmonary hypertension. It was felt that her dyspnea was due to COPD. She has refractory hypertension with intolerance to multiple medications. Renal artery duplex ultrasound showed significant right renal artery stenosis and moderate left renal artery stenosis.  She underwent renal artery angiography in April 2014 with showed severe bilateral ostial renal artery stenosis with extensive atherosclerosis in the abdominal aorta. I performed stenting of right renal artery without complications. Blood pressure improved since that time.   She was diagnosed with recurrent left leg DVT and currently she is on Xarelto. She has been doing reasonably well and denies worsening dyspnea. Blood pressure has been reasonably controlled. She does complain of increased leg claudication bilaterally.    Allergies  Allergen Reactions  . Trimethobenzamide Other (See Comments)    Eye allergy, whites of eyes turned red, for about two weeks, there after.    . Ceftin [Cefuroxime Axetil] Other (See Comments)    Unknown   . Chlorthalidone     hyponatremia  . Hctz [Hydrochlorothiazide]     Hyponatremia   . Tequin [Gatifloxacin] Itching     Current Outpatient Prescriptions on File Prior to Visit  Medication Sig Dispense Refill  . acetaminophen (TYLENOL) 500 MG tablet Take 1,000  mg by mouth every 6 (six) hours as needed for pain.      Marland Kitchen albuterol-ipratropium (COMBIVENT) 18-103 MCG/ACT inhaler Inhale 2 puffs into the lungs every 6 (six) hours as needed.      . diltiazem (CARDIZEM CD) 240 MG 24 hr capsule Take 1 capsule (240 mg total) by mouth daily.  30 capsule  6  . fluticasone (FLONASE) 50 MCG/ACT nasal spray Place 2 sprays into the nose daily.       . hydrALAZINE (APRESOLINE) 100 MG tablet Take 0.5 tablets (50 mg total) by mouth 3 (three) times daily.  90 tablet  3  . HYDROcodone-acetaminophen (NORCO) 10-325 MG per tablet Take 1 tablet by mouth every 4 (four) hours as needed.       Marland Kitchen losartan (COZAAR) 100 MG tablet Take 1 tablet (100 mg total) by mouth daily.  90 tablet  3  . pantoprazole (PROTONIX) 40 MG tablet Take 40 mg by mouth daily.       No current facility-administered medications on file prior to visit.     Past Medical History  Diagnosis Date  . Asthma   . Osteoporosis   . COPD (chronic obstructive pulmonary disease)   . Peripheral vascular disease     a. h/o iliac stents 2010. b. RAS as below.  . History of skin ulcer   . Hypertension   . GERD (gastroesophageal reflux disease)   . Renal artery stenosis     a. s/p complex PTA/R renal stent 12/2012 (decision for staged angioplasty of L renal artery will be based on clinical response).  . Chronic kidney disease (CKD), stage II (mild)     Archie Endo 12/02/2012  . Squamous cell  carcinoma of oropharynx 2008     status post chemotherapy and radiation therapy in 2008  . Cancer of esophagus 2008    s/p chemo and radiation  . History of stomach ulcers   . Daily headache     "related to high blood pressure" (12/02/2012)  . Arthritis   . Hyponatremia     Previously related to HCTZ/chlorthalidone  . Clotting disorder     left leg     Past Surgical History  Procedure Laterality Date  . Peripheral arterial stent graft Bilateral     x2 STENTS BOTH LEGS  . Anterior cervical decomp/discectomy fusion  ~ 2006      "put in spacer" (12/02/2012)  . Portacath placement  2008  . Anterior cervical decomp/discectomy fusion  10/07/2012    Procedure: ANTERIOR CERVICAL DECOMPRESSION/DISCECTOMY FUSION 2 LEVEL/HARDWARE REMOVAL;  Surgeon: Ophelia Charter, MD;  Location: [facility];  Service: Neurosurgery;  Laterality: N/A;  Cervical three-four, cervical four-five Anterior cervical decompression/diskectomy/fusion/interbody plating and prosthesis/removal of spinal concept plate  . Cataract extraction w/ intraocular lens  implant, bilateral  ~2005  . Renal artery stent Right 12/02/2012    Successful angioplasty and bare-metal stent placement to the ostial right renal artery  . Tonsillectomy  ~ 1946  . Breast cyst excision Right ?1980's    negative     Family History  Problem Relation Age of Onset  . Family history unknown: Yes     History   Social History  . Marital Status: Widowed    Spouse Name: N/A    Number of Children: N/A  . Years of Education: N/A   Occupational History  . Not on file.   Social History Main Topics  . Smoking status: Current Every Day Smoker -- 0.25 packs/day for 50 years    Types: Cigarettes  . Smokeless tobacco: Never Used     Comment: 12/02/2012 offered smoking cessation materials; pt declines  . Alcohol Use: 3.0 oz/week    5 Glasses of wine per week     Comment: 12/02/2012 "4-7 glasses sherry/wk"  . Drug Use: No  . Sexual Activity: Not Currently   Other Topics Concern  . Not on file   Social History Narrative  . No narrative on file      PHYSICAL EXAM   BP 146/54  Pulse 64  Ht 5\' 2"  (1.575 m)  Wt 98 lb 8 oz (44.679 kg)  BMI 18.01 kg/m2 Constitutional: She is oriented to person, place, and time. She appears well-developed and well-nourished. No distress.  HENT: No nasal discharge.  Head: Normocephalic and atraumatic.  Eyes: Pupils are equal and round. Right eye exhibits no discharge. Left eye exhibits no discharge.  Neck: Normal range of motion. Neck  supple. No JVD present. No thyromegaly present.  Cardiovascular: Normal rate, regular rhythm, normal heart sounds. Exam reveals no gallop and no friction rub. No murmur heard.  Pulmonary/Chest: Effort normal and breath sounds normal. No stridor. No respiratory distress. She has no wheezes. She has no rales. She exhibits no tenderness.  Abdominal: Soft. Bowel sounds are normal. She exhibits no distension. There is no tenderness. There is no rebound and no guarding.  Musculoskeletal: Normal range of motion. She exhibits +1 edema and no tenderness.  Neurological: She is alert and oriented to person, place, and time. Coordination normal.  Skin: Skin is warm and dry. No rash noted. She is not diaphoretic. No erythema. No pallor.  Psychiatric: She has a normal mood and affect. Her behavior is  normal. Judgment and thought content normal.   YIF:OYDXA  Rhythm  -Old inferior infarct  -Prominent R(V1) -true posterior extension of inferior MI  -Left axis -may be secondary to infarct  -Possible anterior fascicular block.   ABNORMAL   ASSESSMENT AND PLAN

## 2014-01-12 ENCOUNTER — Ambulatory Visit: Payer: Self-pay | Admitting: Internal Medicine

## 2014-01-12 LAB — CBC CANCER CENTER
BASOS PCT: 1.3 %
Basophil #: 0.1 x10 3/mm (ref 0.0–0.1)
EOS PCT: 1.3 %
Eosinophil #: 0.1 x10 3/mm (ref 0.0–0.7)
HCT: 43.4 % (ref 35.0–47.0)
HGB: 14.4 g/dL (ref 12.0–16.0)
LYMPHS ABS: 1.2 x10 3/mm (ref 1.0–3.6)
LYMPHS PCT: 17.4 %
MCH: 30.4 pg (ref 26.0–34.0)
MCHC: 33.1 g/dL (ref 32.0–36.0)
MCV: 92 fL (ref 80–100)
Monocyte #: 0.6 x10 3/mm (ref 0.2–0.9)
Monocyte %: 9.6 %
NEUTROS PCT: 70.4 %
Neutrophil #: 4.7 x10 3/mm (ref 1.4–6.5)
PLATELETS: 287 x10 3/mm (ref 150–440)
RBC: 4.73 10*6/uL (ref 3.80–5.20)
RDW: 16.4 % — ABNORMAL HIGH (ref 11.5–14.5)
WBC: 6.7 x10 3/mm (ref 3.6–11.0)

## 2014-01-12 LAB — HEPATIC FUNCTION PANEL A (ARMC)
ALT: 15 U/L (ref 12–78)
Albumin: 4.1 g/dL (ref 3.4–5.0)
Alkaline Phosphatase: 87 U/L
Bilirubin, Direct: 0.2 mg/dL (ref 0.00–0.20)
Bilirubin,Total: 0.5 mg/dL (ref 0.2–1.0)
SGOT(AST): 18 U/L (ref 15–37)
Total Protein: 7.5 g/dL (ref 6.4–8.2)

## 2014-01-12 LAB — CREATININE, SERUM
Creatinine: 1.22 mg/dL (ref 0.60–1.30)
EGFR (Non-African Amer.): 44 — ABNORMAL LOW
GFR CALC AF AMER: 51 — AB

## 2014-01-12 LAB — CALCIUM: Calcium, Total: 9 mg/dL (ref 8.5–10.1)

## 2014-01-17 ENCOUNTER — Encounter (INDEPENDENT_AMBULATORY_CARE_PROVIDER_SITE_OTHER): Payer: Medicare HMO

## 2014-01-17 ENCOUNTER — Other Ambulatory Visit (HOSPITAL_COMMUNITY): Payer: Self-pay | Admitting: *Deleted

## 2014-01-17 DIAGNOSIS — I739 Peripheral vascular disease, unspecified: Secondary | ICD-10-CM

## 2014-01-31 ENCOUNTER — Ambulatory Visit: Payer: Self-pay | Admitting: Internal Medicine

## 2014-02-04 ENCOUNTER — Telehealth: Payer: Self-pay

## 2014-02-04 MED ORDER — HYDRALAZINE HCL 100 MG PO TABS: ORAL_TABLET | ORAL | Status: DC

## 2014-02-04 NOTE — Telephone Encounter (Signed)
That's fine to increase Hydralazine.

## 2014-02-04 NOTE — Telephone Encounter (Signed)
Patient blood pressure this am was 177/57 at 0930 It has been in this range for several days  She decided to take a whole hydralazine tablet instead of a half  After taking her medication it is now 117/47 at 1100  She states she is concerned that her blood pressure is so high in the morning  She asked if she could start taking a full hydralazine tablet in the morning

## 2014-02-04 NOTE — Telephone Encounter (Signed)
Informed patient that per Dr. Fletcher Anon she can increase her morning dose of Hydralazine to 100 mg  Continue 50 mg at lunch and at night  Instructed patient to continue to monitor blood pressure and call is it persistently over 170 SBP or under 100 SBP  Patient verbalized understanding

## 2014-02-04 NOTE — Telephone Encounter (Signed)
Pt called and states her BP is high 177/57 this a.m. States she cannot get it to come down. No other symptoms.

## 2014-02-08 ENCOUNTER — Encounter: Payer: Self-pay | Admitting: Cardiovascular Disease

## 2014-02-08 ENCOUNTER — Ambulatory Visit (INDEPENDENT_AMBULATORY_CARE_PROVIDER_SITE_OTHER): Payer: Medicare HMO | Admitting: Cardiovascular Disease

## 2014-02-08 VITALS — BP 148/54 | HR 72 | Ht 62.0 in | Wt 101.2 lb

## 2014-02-08 DIAGNOSIS — I739 Peripheral vascular disease, unspecified: Secondary | ICD-10-CM

## 2014-02-08 DIAGNOSIS — I701 Atherosclerosis of renal artery: Secondary | ICD-10-CM

## 2014-02-08 DIAGNOSIS — I10 Essential (primary) hypertension: Secondary | ICD-10-CM

## 2014-02-08 MED ORDER — HYDRALAZINE HCL 100 MG PO TABS: 100.0000 mg | ORAL_TABLET | Freq: Three times a day (TID) | ORAL | Status: DC

## 2014-02-08 NOTE — Patient Instructions (Signed)
Your physician has recommended you make the following change in your medication:  Increase Hydralazine to 100 mg three times daily    Your physician recommends that you schedule a follow-up appointment in:  2 months

## 2014-02-08 NOTE — Assessment & Plan Note (Signed)
Continue treatment with diltiazem and losartan. I increased the dose of hydralazine to 100 mg 3 times daily. She has intolerance to multiple medications.

## 2014-02-08 NOTE — Assessment & Plan Note (Signed)
I congratulated on smoking cessation. I advised him to start a walking program. She has no rest pain. Recent noninvasive evaluation showed significant drop in ABI  Bilaterally.  If symptoms do not improve in few months, I will consider arterial angiography and possible endovascular intervention.

## 2014-02-08 NOTE — Progress Notes (Signed)
HPI  This is a 73 year old female who is here today for a followup visit regarding refractory hypertension s/p renal artery stenting and peripheral arterial disease. She has no previous cardiac history. 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  Previous cardiac evaluation in 2013 for dyspnea included  a nuclear stress test which showed no evidence of ischemia. Echocardiogram showed normal LV systolic function with mild diastolic dysfunction and borderline pulmonary hypertension. It was felt that her dyspnea was due to COPD. She has refractory hypertension with intolerance to multiple medications. Renal artery duplex ultrasound showed significant right renal artery stenosis and moderate left renal artery stenosis.  She underwent renal artery angiography in April 2014 with showed severe bilateral ostial renal artery stenosis with extensive atherosclerosis in the abdominal aorta. I performed stenting of right renal artery without complications. Blood pressure improved since that time.   She was diagnosed with recurrent left leg DVT and currently she is on Xarelto. She complains of worsening blood pressure control over the last few weeks. We increased the dose of hydralazine but blood pressure is still not controlled. Usually when blood pressure is high she complains of a headache. She continues to have significant bilateral calf claudication with 50 feet of walking. She quit smoking recently.     Allergies  Allergen Reactions  . Trimethobenzamide Other (See Comments)    Eye allergy, whites of eyes turned red, for about two weeks, there after.    . Ceftin [Cefuroxime Axetil] Other (See Comments)    Unknown   . Chlorthalidone     hyponatremia  . Hctz [Hydrochlorothiazide]    Hyponatremia   . Tequin [Gatifloxacin] Itching     Current Outpatient Prescriptions on File Prior to Visit  Medication Sig Dispense Refill  . acetaminophen (TYLENOL) 500 MG tablet Take 1,000 mg by mouth every 6 (six) hours as needed for pain.      Marland Kitchen albuterol-ipratropium (COMBIVENT) 18-103 MCG/ACT inhaler Inhale 2 puffs into the lungs every 6 (six) hours as needed.      . diltiazem (CARDIZEM CD) 240 MG 24 hr capsule Take 1 capsule (240 mg total) by mouth daily.  30 capsule  6  . fluticasone (FLONASE) 50 MCG/ACT nasal spray Place 2 sprays into the nose daily.       . hydrALAZINE (APRESOLINE) 100 MG tablet Take 1 full tablet (100 mg) in the morning. Take a half a tablet (50 mg) at lunch and at night.  90 tablet  3  . HYDROcodone-acetaminophen (NORCO) 10-325 MG per tablet Take 1 tablet by mouth every 4 (four) hours as needed.       Marland Kitchen losartan (COZAAR) 100 MG tablet Take 1 tablet (100 mg total) by mouth daily.  90 tablet  3  . pantoprazole (PROTONIX) 40 MG tablet Take 40 mg by mouth daily.      . Ranitidine HCl (ZANTAC PO) Take by mouth daily.      Alveda Reasons 20 MG TABS tablet Take 20 mg by mouth daily with supper.        No current facility-administered medications on file prior to visit.     Past Medical History  Diagnosis Date  . Asthma   . Osteoporosis   . COPD (chronic obstructive pulmonary disease)   . Peripheral vascular disease  a. h/o iliac stents 2010. b. RAS as below.  . History of skin ulcer   . Hypertension   . GERD (gastroesophageal reflux disease)   . Renal artery stenosis     a. s/p complex PTA/R renal stent 12/2012 (decision for staged angioplasty of L renal artery will be based on clinical response).  . Chronic kidney disease (CKD), stage II (mild)     Archie Endo 12/02/2012  . Squamous cell carcinoma of oropharynx 2008     status post chemotherapy and radiation therapy in 2008  . Cancer of esophagus 2008    s/p chemo and radiation  . History of stomach ulcers   .  Daily headache     "related to high blood pressure" (12/02/2012)  . Arthritis   . Hyponatremia     Previously related to HCTZ/chlorthalidone  . Clotting disorder     left leg     Past Surgical History  Procedure Laterality Date  . Peripheral arterial stent graft Bilateral     x2 STENTS BOTH LEGS  . Anterior cervical decomp/discectomy fusion  ~ 2006    "put in spacer" (12/02/2012)  . Portacath placement  2008  . Anterior cervical decomp/discectomy fusion  10/07/2012    Procedure: ANTERIOR CERVICAL DECOMPRESSION/DISCECTOMY FUSION 2 LEVEL/HARDWARE REMOVAL;  Surgeon: Ophelia Charter, MD;  Location: [facility];  Service: Neurosurgery;  Laterality: N/A;  Cervical three-four, cervical four-five Anterior cervical decompression/diskectomy/fusion/interbody plating and prosthesis/removal of spinal concept plate  . Cataract extraction w/ intraocular lens  implant, bilateral  ~2005  . Renal artery stent Right 12/02/2012    Successful angioplasty and bare-metal stent placement to the ostial right renal artery  . Tonsillectomy  ~ 1946  . Breast cyst excision Right ?1980's    negative     Family History  Problem Relation Age of Onset  . Family history unknown: Yes     History   Social History  . Marital Status: Widowed    Spouse Name: N/A    Number of Children: N/A  . Years of Education: N/A   Occupational History  . Not on file.   Social History Main Topics  . Smoking status: Former Smoker -- 0.25 packs/day for 50 years    Types: Cigarettes  . Smokeless tobacco: Never Used     Comment: 12/02/2012 offered smoking cessation materials; Electronic cigarette  . Alcohol Use: 3.0 oz/week    5 Glasses of wine per week     Comment: 12/02/2012 "4-7 glasses sherry/wk"  . Drug Use: No  . Sexual Activity: Not Currently   Other Topics Concern  . Not on file   Social History Narrative  . No narrative on file      PHYSICAL EXAM   BP 148/54  Pulse 72  Ht 5\' 2"  (1.575 m)  Wt 101 lb 4 oz  (45.927 kg)  BMI 18.51 kg/m2 Constitutional: She is oriented to person, place, and time. She appears well-developed and well-nourished. No distress.  HENT: No nasal discharge.  Head: Normocephalic and atraumatic.  Eyes: Pupils are equal and round. Right eye exhibits no discharge. Left eye exhibits no discharge.  Neck: Normal range of motion. Neck supple. No JVD present. No thyromegaly present.  Cardiovascular: Normal rate, regular rhythm, normal heart sounds. Exam reveals no gallop and no friction rub. No murmur heard.  Pulmonary/Chest: Effort normal and breath sounds normal. No stridor. No respiratory distress. She has no wheezes. She has no rales. She exhibits no tenderness.  Abdominal: Soft. Bowel sounds are  normal. She exhibits no distension. There is no tenderness. There is no rebound and no guarding.  Musculoskeletal: Normal range of motion. She exhibits +1 edema and no tenderness.  Neurological: She is alert and oriented to person, place, and time. Coordination normal.  Skin: Skin is warm and dry. No rash noted. She is not diaphoretic. No erythema. No pallor.  Psychiatric: She has a normal mood and affect. Her behavior is normal. Judgment and thought content normal.     ASSESSMENT AND PLAN

## 2014-02-08 NOTE — Assessment & Plan Note (Signed)
If blood pressure continues to be elevated, I will consider proceeding with angiography to check the right renal artery stent and proceed with left renal artery stenting. I will reevaluate her in 2 months.

## 2014-02-09 ENCOUNTER — Telehealth: Payer: Self-pay | Admitting: *Deleted

## 2014-02-09 ENCOUNTER — Other Ambulatory Visit: Payer: Self-pay

## 2014-02-09 MED ORDER — HYDRALAZINE HCL 100 MG PO TABS: 100.0000 mg | ORAL_TABLET | Freq: Three times a day (TID) | ORAL | Status: DC

## 2014-02-09 NOTE — Telephone Encounter (Signed)
New Rx sent for hydralazine 100 mg take one tablet three times a day sent to local pharmacy. Patient aware to contact pharmacy for when it will be ready.

## 2014-02-09 NOTE — Telephone Encounter (Signed)
100mg  3x a day needs new rx called in with new dosage

## 2014-02-09 NOTE — Telephone Encounter (Signed)
Refill sent for hydralazine 100 mg take one tablet three times a day.

## 2014-03-23 ENCOUNTER — Other Ambulatory Visit (HOSPITAL_COMMUNITY): Payer: Self-pay | Admitting: *Deleted

## 2014-03-23 DIAGNOSIS — I739 Peripheral vascular disease, unspecified: Secondary | ICD-10-CM

## 2014-04-05 ENCOUNTER — Encounter (INDEPENDENT_AMBULATORY_CARE_PROVIDER_SITE_OTHER): Payer: Medicare HMO

## 2014-04-05 DIAGNOSIS — I701 Atherosclerosis of renal artery: Secondary | ICD-10-CM

## 2014-04-05 DIAGNOSIS — I10 Essential (primary) hypertension: Secondary | ICD-10-CM

## 2014-04-05 DIAGNOSIS — I739 Peripheral vascular disease, unspecified: Secondary | ICD-10-CM

## 2014-04-08 ENCOUNTER — Telehealth: Payer: Self-pay | Admitting: *Deleted

## 2014-04-08 DIAGNOSIS — I739 Peripheral vascular disease, unspecified: Secondary | ICD-10-CM

## 2014-04-08 NOTE — Telephone Encounter (Signed)
Message copied by Tracie Harrier on Fri Apr 08, 2014 11:43 AM ------      Message from: Kathlyn Sacramento A      Created: Wed Apr 06, 2014  3:00 PM       Patent renal artery stent. Recommend a follow up renal artery duplex in 1 year. ------

## 2014-04-11 ENCOUNTER — Ambulatory Visit (INDEPENDENT_AMBULATORY_CARE_PROVIDER_SITE_OTHER): Payer: Medicare HMO | Admitting: Cardiovascular Disease

## 2014-04-11 ENCOUNTER — Encounter: Payer: Self-pay | Admitting: Cardiovascular Disease

## 2014-04-11 VITALS — BP 138/67 | HR 84 | Ht 62.0 in | Wt 99.2 lb

## 2014-04-11 DIAGNOSIS — I10 Essential (primary) hypertension: Secondary | ICD-10-CM

## 2014-04-11 DIAGNOSIS — I739 Peripheral vascular disease, unspecified: Secondary | ICD-10-CM

## 2014-04-11 DIAGNOSIS — I701 Atherosclerosis of renal artery: Secondary | ICD-10-CM

## 2014-04-11 NOTE — Patient Instructions (Signed)
Continue same medications.   Refer to [facility] at [facility].   Your physician wants you to follow-up in: 6 months.  You will receive a reminder letter in the mail two months in advance. If you don't receive a letter, please call our office to schedule the follow-up appointment.

## 2014-04-11 NOTE — Progress Notes (Signed)
HPI  This is a 73 year old female who is here today for a followup visit regarding refractory hypertension s/p renal artery stenting and peripheral arterial disease. She has no previous cardiac history. 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  Previous cardiac evaluation in 2013 for dyspnea included  a nuclear stress test which showed no evidence of ischemia. Echocardiogram showed normal LV systolic function with mild diastolic dysfunction and borderline pulmonary hypertension. It was felt that her dyspnea was due to COPD. She has refractory hypertension with intolerance to multiple medications. Renal artery duplex ultrasound showed significant right renal artery stenosis and moderate left renal artery stenosis.  She underwent renal artery angiography in April 2014 with showed severe bilateral ostial renal artery stenosis with extensive atherosclerosis in the abdominal aorta. I performed stenting of right renal artery without complications. Blood pressure improved since that time.   She was diagnosed with recurrent left leg DVT and currently she is on Xarelto. Blood pressure was elevated during last visit. I increased the dose of hydralazine 100 mg 3 times daily. She continues to have significant bilateral calf claudication with 50 feet of walking but reports improvement in symptoms. She quit smoking recently but did admit to smoking 2 cigarettes a day.     Allergies  Allergen Reactions  . Trimethobenzamide Other (See Comments)    Eye allergy, whites of eyes turned red, for about two weeks, there after.    . Ceftin [Cefuroxime Axetil] Other (See Comments)    Unknown   . Chlorthalidone     hyponatremia  . Hctz [Hydrochlorothiazide]     Hyponatremia   . Tequin  [Gatifloxacin] Itching     Current Outpatient Prescriptions on File Prior to Visit  Medication Sig Dispense Refill  . acetaminophen (TYLENOL) 500 MG tablet Take 1,000 mg by mouth every 6 (six) hours as needed for pain.      Marland Kitchen albuterol-ipratropium (COMBIVENT) 18-103 MCG/ACT inhaler Inhale 2 puffs into the lungs every 6 (six) hours as needed.      . cyanocobalamin (,VITAMIN B-12,) 1000 MCG/ML injection Inject into the muscle.      . diltiazem (CARDIZEM CD) 240 MG 24 hr capsule Take 1 capsule (240 mg total) by mouth daily.  30 capsule  6  . fluticasone (FLONASE) 50 MCG/ACT nasal spray Place 2 sprays into the nose daily.       . hydrALAZINE (APRESOLINE) 100 MG tablet Take 1 tablet (100 mg total) by mouth 3 (three) times daily.  90 tablet  3  . losartan (COZAAR) 100 MG tablet Take 1 tablet (100 mg total) by mouth daily.  90 tablet  3  . pantoprazole (PROTONIX) 40 MG tablet Take 40 mg by mouth daily.      . Ranitidine HCl (ZANTAC PO) Take by mouth daily.      Alveda Reasons 20 MG TABS tablet Take 20 mg by mouth daily with supper.        No current facility-administered medications on file prior to visit.     Past Medical History  Diagnosis Date  . Asthma   . Osteoporosis   . COPD (chronic obstructive pulmonary disease)   . Peripheral vascular disease     a. h/o iliac stents 2010. b. RAS as below.  . History of skin ulcer   . Hypertension   .  GERD (gastroesophageal reflux disease)   . Renal artery stenosis     a. s/p complex PTA/R renal stent 12/2012 (decision for staged angioplasty of L renal artery will be based on clinical response).  . Chronic kidney disease (CKD), stage II (mild)     Archie Endo 12/02/2012  . Squamous cell carcinoma of oropharynx 2008     status post chemotherapy and radiation therapy in 2008  . Cancer of esophagus 2008    s/p chemo and radiation  . History of stomach ulcers   . Daily headache     "related to high blood pressure" (12/02/2012)  . Arthritis   . Hyponatremia      Previously related to HCTZ/chlorthalidone  . Clotting disorder     left leg     Past Surgical History  Procedure Laterality Date  . Peripheral arterial stent graft Bilateral     x2 STENTS BOTH LEGS  . Anterior cervical decomp/discectomy fusion  ~ 2006    "put in spacer" (12/02/2012)  . Portacath placement  2008  . Anterior cervical decomp/discectomy fusion  10/07/2012    Procedure: ANTERIOR CERVICAL DECOMPRESSION/DISCECTOMY FUSION 2 LEVEL/HARDWARE REMOVAL;  Surgeon: Ophelia Charter, MD;  Location: [facility];  Service: Neurosurgery;  Laterality: N/A;  Cervical three-four, cervical four-five Anterior cervical decompression/diskectomy/fusion/interbody plating and prosthesis/removal of spinal concept plate  . Cataract extraction w/ intraocular lens  implant, bilateral  ~2005  . Renal artery stent Right 12/02/2012    Successful angioplasty and bare-metal stent placement to the ostial right renal artery  . Tonsillectomy  ~ 1946  . Breast cyst excision Right ?1980's    negative     Family History  Problem Relation Age of Onset  . Family history unknown: Yes     History   Social History  . Marital Status: Widowed    Spouse Name: N/A    Number of Children: N/A  . Years of Education: N/A   Occupational History  . Not on file.   Social History Main Topics  . Smoking status: Current Every Day Smoker -- 0.25 packs/day for 50 years    Types: Cigarettes  . Smokeless tobacco: Never Used     Comment: 12/02/2012 offered smoking cessation materials; Electronic cigarette  . Alcohol Use: 3.0 oz/week    5 Glasses of wine per week     Comment: 12/02/2012 "4-7 glasses sherry/wk"  . Drug Use: No  . Sexual Activity: Not Currently   Other Topics Concern  . Not on file   Social History Narrative  . No narrative on file      PHYSICAL EXAM   BP 138/67  Pulse 84  Ht 5\' 2"  (1.575 m)  Wt 99 lb 4 oz (45.02 kg)  BMI 18.15 kg/m2 Constitutional: She is oriented to person, place, and  time. She appears well-developed and well-nourished. No distress.  HENT: No nasal discharge.  Head: Normocephalic and atraumatic.  Eyes: Pupils are equal and round. Right eye exhibits no discharge. Left eye exhibits no discharge.  Neck: Normal range of motion. Neck supple. No JVD present. No thyromegaly present.  Cardiovascular: Normal rate, regular rhythm, normal heart sounds. Exam reveals no gallop and no friction rub. No murmur heard.  Pulmonary/Chest: Effort normal and breath sounds normal. No stridor. No respiratory distress. She has no wheezes. She has no rales. She exhibits no tenderness.  Abdominal: Soft. Bowel sounds are normal. She exhibits no distension. There is no tenderness. There is no rebound and no guarding.  Musculoskeletal: Normal range  of motion. She exhibits +1 edema and no tenderness.  Neurological: She is alert and oriented to person, place, and time. Coordination normal.  Skin: Skin is warm and dry. No rash noted. She is not diaphoretic. No erythema. No pallor.  Psychiatric: She has a normal mood and affect. Her behavior is normal. Judgment and thought content normal.     ASSESSMENT AND PLAN

## 2014-04-11 NOTE — Assessment & Plan Note (Signed)
She has no rest pain. Recent noninvasive evaluation showed significant drop in ABI  Bilaterally.  She reports some improvement in claudication and prefers to continue with medical therapy.

## 2014-04-11 NOTE — Assessment & Plan Note (Signed)
Blood pressure is now controlled on current medications. 

## 2014-04-11 NOTE — Assessment & Plan Note (Signed)
Blood pressure is is now controlled after adjusting her medications. Continue medical therapy .

## 2014-04-12 ENCOUNTER — Telehealth: Payer: Self-pay | Admitting: *Deleted

## 2014-04-12 DIAGNOSIS — Z7689 Persons encountering health services in other specified circumstances: Secondary | ICD-10-CM

## 2014-04-12 MED ORDER — DILTIAZEM HCL ER COATED BEADS 240 MG PO CP24: 240.0000 mg | ORAL_CAPSULE | Freq: Every day | ORAL | Status: DC

## 2014-04-12 NOTE — Telephone Encounter (Signed)
Called patient to inform her that her appt to establish primary care is on 04/22/14 at 1:30 pm  Patient requested refill on Diltiazem  Refill sent

## 2014-04-22 ENCOUNTER — Encounter: Payer: Commercial Managed Care - HMO | Admitting: Internal Medicine

## 2014-04-22 ENCOUNTER — Encounter: Payer: Self-pay | Admitting: Internal Medicine

## 2014-04-22 NOTE — Progress Notes (Signed)
Pre visit review using our clinic review tool, if applicable. No additional management support is needed unless otherwise documented below in the visit note. 

## 2014-05-23 ENCOUNTER — Telehealth: Payer: Self-pay

## 2014-05-23 ENCOUNTER — Encounter: Payer: Self-pay | Admitting: Internal Medicine

## 2014-05-23 ENCOUNTER — Ambulatory Visit (INDEPENDENT_AMBULATORY_CARE_PROVIDER_SITE_OTHER): Payer: Commercial Managed Care - HMO | Admitting: Internal Medicine

## 2014-05-23 VITALS — BP 144/66 | HR 70 | Temp 97.9°F | Ht 62.5 in | Wt 100.0 lb

## 2014-05-23 DIAGNOSIS — K219 Gastro-esophageal reflux disease without esophagitis: Secondary | ICD-10-CM

## 2014-05-23 DIAGNOSIS — Z23 Encounter for immunization: Secondary | ICD-10-CM

## 2014-05-23 DIAGNOSIS — E785 Hyperlipidemia, unspecified: Secondary | ICD-10-CM

## 2014-05-23 DIAGNOSIS — I10 Essential (primary) hypertension: Secondary | ICD-10-CM

## 2014-05-23 DIAGNOSIS — I82409 Acute embolism and thrombosis of unspecified deep veins of unspecified lower extremity: Secondary | ICD-10-CM

## 2014-05-23 DIAGNOSIS — I739 Peripheral vascular disease, unspecified: Secondary | ICD-10-CM

## 2014-05-23 DIAGNOSIS — I82402 Acute embolism and thrombosis of unspecified deep veins of left lower extremity: Secondary | ICD-10-CM

## 2014-05-23 DIAGNOSIS — I701 Atherosclerosis of renal artery: Secondary | ICD-10-CM

## 2014-05-23 MED ORDER — RIVAROXABAN 20 MG PO TABS: 20.0000 mg | ORAL_TABLET | Freq: Every day | ORAL | Status: DC

## 2014-05-23 NOTE — Assessment & Plan Note (Signed)
Follows with Dr. Fletcher Anon

## 2014-05-23 NOTE — Assessment & Plan Note (Signed)
Takes protonix daily Occasional zantac at night

## 2014-05-23 NOTE — Assessment & Plan Note (Signed)
Diet controlled.  

## 2014-05-23 NOTE — Assessment & Plan Note (Signed)
Controlled on multiple medications Continue for now Continue to follow with Dr. Fletcher Anon

## 2014-05-23 NOTE — Assessment & Plan Note (Signed)
She denies history of this but per reports has had 2 stents placed

## 2014-05-23 NOTE — Telephone Encounter (Signed)
Informed patient of her stent date  Patient verbalized understanding

## 2014-05-23 NOTE — Addendum Note (Signed)
Addended by: Lurlean Nanny on: 05/23/2014 05:03 PM   Modules accepted: Orders

## 2014-05-23 NOTE — Addendum Note (Signed)
Addended by: Jearld Fenton on: 05/23/2014 03:45 PM   Modules accepted: Orders

## 2014-05-23 NOTE — Patient Instructions (Addendum)

## 2014-05-23 NOTE — Progress Notes (Signed)
HPI  Pt presents to the clinic today to establish care. She is transferring care from Dr. Kary Kos at [facility]. She is seeing Dr. Fletcher Anon for management of her blood pressure.  Flu: yearly, scheduled to get one next week  Tetanus: > 10 years ago Pneumovax: never Zoxtovax: 2012 Pap Smear: 2013 Mammogram: 2014 Colon Screening: 2013 Bone Density: 2014 Eye doctor: yearly Dentist: yearly  Past Medical History  Diagnosis Date  . Asthma   . COPD (chronic obstructive pulmonary disease)   . Peripheral vascular disease     a. h/o iliac stents 2010. b. RAS as below.  . History of skin ulcer   . Hypertension   . GERD (gastroesophageal reflux disease)   . Renal artery stenosis     a. s/p complex PTA/R renal stent 12/2012 (decision for staged angioplasty of L renal artery will be based on clinical response).  . Chronic kidney disease (CKD), stage II (mild)     Archie Endo 12/02/2012  . Squamous cell carcinoma of oropharynx 2008     status post chemotherapy and radiation therapy in 2008  . Cancer of esophagus 2008    s/p chemo and radiation  . History of stomach ulcers   . Daily headache     "related to high blood pressure" (12/02/2012)  . Hyponatremia     Previously related to HCTZ/chlorthalidone  . Clotting disorder     left leg    Current Outpatient Prescriptions  Medication Sig Dispense Refill  . acetaminophen (TYLENOL) 500 MG tablet Take 1,000 mg by mouth every 6 (six) hours as needed for pain.      Marland Kitchen albuterol-ipratropium (COMBIVENT) 18-103 MCG/ACT inhaler Inhale 2 puffs into the lungs every 6 (six) hours as needed.      . cyanocobalamin (,VITAMIN B-12,) 1000 MCG/ML injection Inject into the muscle.      . diltiazem (CARDIZEM CD) 240 MG 24 hr capsule Take 1 capsule (240 mg total) by mouth daily.  30 capsule  6  . fluticasone (FLONASE) 50 MCG/ACT nasal spray Place 2 sprays into the nose daily.       . hydrALAZINE (APRESOLINE) 100 MG tablet Take 1 tablet (100 mg total) by mouth 3  (three) times daily.  90 tablet  3  . losartan (COZAAR) 100 MG tablet Take 1 tablet (100 mg total) by mouth daily.  90 tablet  3  . pantoprazole (PROTONIX) 40 MG tablet Take 40 mg by mouth daily.      . Ranitidine HCl (ZANTAC PO) Take by mouth daily.      Alveda Reasons 20 MG TABS tablet Take 20 mg by mouth daily with supper.        No current facility-administered medications for this visit.    Allergies  Allergen Reactions  . Trimethobenzamide Other (See Comments)    Eye allergy, whites of eyes turned red, for about two weeks, there after.    . Ceftin [Cefuroxime Axetil] Other (See Comments)    Unknown   . Chlorthalidone     hyponatremia  . Hctz [Hydrochlorothiazide]     Hyponatremia   . Tequin [Gatifloxacin] Itching    No family history on file.  History   Social History  . Marital Status: Widowed    Spouse Name: N/A    Number of Children: N/A  . Years of Education: N/A   Occupational History  . Not on file.   Social History Main Topics  . Smoking status: Current Every Day Smoker -- 0.25 packs/day for 50 years  Types: Cigarettes  . Smokeless tobacco: Never Used     Comment: 12/02/2012 offered smoking cessation materials; Electronic cigarette  . Alcohol Use: 3.0 oz/week    5 Glasses of wine per week     Comment: 12/02/2012 "4-7 glasses sherry/wk"  . Drug Use: No  . Sexual Activity: Not Currently   Other Topics Concern  . Not on file   Social History Narrative  . No narrative on file    ROS:  Constitutional: Denies fever, malaise, fatigue, headache or abrupt weight changes.  Respiratory: Pt reports cough. Denies difficulty breathing, shortness of breath, or sputum production.   Cardiovascular: Denies chest pain, chest tightness, palpitations or swelling in the hands or feet.  Gastrointestinal: Denies abdominal pain, bloating, constipation, diarrhea or blood in the stool.  Neurological: Denies dizziness, difficulty with memory, difficulty with speech or problems  with balance and coordination.   No other specific complaints in a complete review of systems (except as listed in HPI above).  PE:  BP 144/66  Pulse 70  Temp(Src) 97.9 F (36.6 C) (Oral)  Ht 5' 2.5" (1.588 m)  Wt 100 lb (45.36 kg)  BMI 17.99 kg/m2  SpO2 98%  Wt Readings from Last 3 Encounters:  05/23/14 100 lb (45.36 kg)  04/11/14 99 lb 4 oz (45.02 kg)  02/08/14 101 lb 4 oz (45.927 kg)    General: Appears her stated age, well developed, well nourished in NAD. Cardiovascular: Normal rate and rhythm. S1,S2 noted.  No murmur, rubs or gallops noted.  Pulmonary/Chest: Normal effort and coarse vesicular breath sounds. No respiratory distress. No wheezes, rales or ronchi noted.  Abdomen: Soft and nontender. Normal bowel sounds, no bruits noted. No distention or masses noted. Liver, spleen and kidneys non palpable.   BMET    Component Value Date/Time   NA 137 03/18/2013 1603   NA 124* 12/03/2012 0425   K 4.9 03/18/2013 1603   CL 104 03/18/2013 1603   CO2 16* 03/18/2013 1603   GLUCOSE 91 03/18/2013 1603   GLUCOSE 85 12/03/2012 0425   BUN 23 03/18/2013 1603   BUN 15 12/03/2012 0425   CREATININE 1.30* 03/18/2013 1603   CALCIUM 9.1 03/18/2013 1603   GFRNONAA 41* 03/18/2013 1603   GFRAA 47* 03/18/2013 1603     CBC    Component Value Date/Time   WBC CANCELED 03/18/2013 1603   WBC 5.4 12/03/2012 0425   RBC CANCELED 03/18/2013 1603   RBC 3.96 12/03/2012 0425   HGB CANCELED 03/18/2013 1603   HCT CANCELED 03/18/2013 1603   PLT 309 01/28/2013 1639   MCV 93 01/28/2013 1639   MCH 32.5 01/28/2013 1639   MCH 28.8 12/03/2012 0425   MCHC 34.9 01/28/2013 1639   MCHC 35.8 12/03/2012 0425   RDW 16.6* 01/28/2013 1639   RDW 15.2 12/03/2012 0425   LYMPHSABS CANCELED 03/18/2013 1603   EOSABS CANCELED 03/18/2013 1603   BASOSABS CANCELED 03/18/2013 1603       Assessment and Plan:  Health Maintenance:  Prevnar today  RTC in 6 months or sooner if needed

## 2014-05-23 NOTE — Progress Notes (Signed)
Pre visit review using our clinic review tool, if applicable. No additional management support is needed unless otherwise documented below in the visit note. 

## 2014-05-23 NOTE — Assessment & Plan Note (Signed)
On xarelto She wants to stop this medication Advised her to continue for now

## 2014-05-23 NOTE — Telephone Encounter (Signed)
Pt called, wants to know" when Dr. Fletcher Anon put stents in my kidneys". Please call.

## 2014-06-09 ENCOUNTER — Ambulatory Visit: Payer: Self-pay | Admitting: Nephrology

## 2014-06-13 ENCOUNTER — Other Ambulatory Visit: Payer: Self-pay | Admitting: *Deleted

## 2014-06-13 MED ORDER — LOSARTAN POTASSIUM 100 MG PO TABS: 100.0000 mg | ORAL_TABLET | Freq: Every day | ORAL | Status: DC

## 2014-06-13 NOTE — Telephone Encounter (Signed)
90 day supply with refills

## 2014-06-15 ENCOUNTER — Ambulatory Visit: Payer: Self-pay | Admitting: Internal Medicine

## 2014-06-15 LAB — CBC CANCER CENTER
Basophil #: 0.1 x10 3/mm (ref 0.0–0.1)
Basophil %: 1.3 %
EOS ABS: 0 x10 3/mm (ref 0.0–0.7)
Eosinophil %: 0.8 %
HCT: 42.5 % (ref 35.0–47.0)
HGB: 13.9 g/dL (ref 12.0–16.0)
Lymphocyte #: 0.8 x10 3/mm — ABNORMAL LOW (ref 1.0–3.6)
Lymphocyte %: 14.7 %
MCH: 30.5 pg (ref 26.0–34.0)
MCHC: 32.7 g/dL (ref 32.0–36.0)
MCV: 93 fL (ref 80–100)
MONO ABS: 0.7 x10 3/mm (ref 0.2–0.9)
Monocyte %: 12.9 %
NEUTROS PCT: 70.3 %
Neutrophil #: 4 x10 3/mm (ref 1.4–6.5)
PLATELETS: 312 x10 3/mm (ref 150–440)
RBC: 4.56 10*6/uL (ref 3.80–5.20)
RDW: 16.1 % — ABNORMAL HIGH (ref 11.5–14.5)
WBC: 5.7 x10 3/mm (ref 3.6–11.0)

## 2014-07-03 ENCOUNTER — Ambulatory Visit: Payer: Self-pay | Admitting: Internal Medicine

## 2014-07-20 ENCOUNTER — Telehealth: Payer: Self-pay

## 2014-07-20 ENCOUNTER — Telehealth: Payer: Self-pay | Admitting: Internal Medicine

## 2014-07-20 NOTE — Telephone Encounter (Signed)
Pt has been taking Xarelto since 12/2013; last few months having loose BM's and last 3 months pt has been itching on face, feet and legs. Pt has not changed anything such as soap, deodorant etc. Pt has been taking Benadryl which helps itching but itching never completely goes away. Pt has read that Xarelto can cause diarrhea and itching. Pt wants to know if should stop Xarelto. Please advise. [employer].

## 2014-07-20 NOTE — Telephone Encounter (Signed)
Patient received your message and she's asking for you to call her back.

## 2014-07-20 NOTE — Telephone Encounter (Signed)
Left detailed msg on VM per HIPAA  

## 2014-07-20 NOTE — Telephone Encounter (Signed)
She needs to discuss this with cardiology as they are the one who prescribed it

## 2014-07-21 ENCOUNTER — Other Ambulatory Visit: Payer: Self-pay | Admitting: Cardiovascular Disease

## 2014-07-21 NOTE — Telephone Encounter (Signed)
She states the medication does cost too much

## 2014-07-21 NOTE — Telephone Encounter (Signed)
She has a history of recurrent blood clots. Stopping this medication would predispose her to recurrent blood clots, that might occur in the lungs or the brain. I would not stop it. Is the cost the issue. Does she want to change to a different anticoagulation medication?

## 2014-07-21 NOTE — Telephone Encounter (Signed)
Pt states Cardiology was not the one prescribing--it was her PCP at [facility] states she wants to stop medication and wants to know what needs to be done for her to stop medication--please advise

## 2014-07-21 NOTE — Telephone Encounter (Signed)
Please refer to other msg

## 2014-07-22 NOTE — Telephone Encounter (Signed)
She could be switched to coumadin but she will have to have fre/INR checked. We can also check with her insurance to see if Eloquis would be cheaper

## 2014-07-22 NOTE — Telephone Encounter (Signed)
Pt states she would like to try the Eliquis--i will check with her insurance formulary--pt states she will be going with [employer] next year---if it is a covered med please advise on what to send to pharmacy

## 2014-07-25 NOTE — Telephone Encounter (Signed)
Ok, check formulary and let me know if covered- then I will let you know the dose. If not covered, will have to do coumadin

## 2014-07-25 NOTE — Telephone Encounter (Signed)
Left detailed msg on VM per HIPAA letting pt know Eliquis is covered under her insurance however they require mail order only--if pt is okay--per verbal order from [facility] send in Eliquis 2.5 PO BID

## 2014-07-26 NOTE — Telephone Encounter (Signed)
Pt is aware as instructed--pt states she is changing insurance in January and will just stay on Xarelto for now

## 2014-08-11 ENCOUNTER — Encounter (HOSPITAL_COMMUNITY): Payer: Self-pay | Admitting: Cardiovascular Disease

## 2014-08-12 ENCOUNTER — Telehealth: Payer: Self-pay | Admitting: Cardiovascular Disease

## 2014-08-12 NOTE — Telephone Encounter (Signed)
Pt is calling her BP is 184/55 has been the highest. Its been like this for a while. Pt is concerned. Today her bp was 168/53. She is wanting to know what needs to be done now.

## 2014-08-12 NOTE — Telephone Encounter (Signed)
Not feeling anything different.  Just wants to let us know, to know what she can do

## 2014-08-12 NOTE — Telephone Encounter (Signed)
Patients pressure is not 135/52 She just wants to let Dr. Fletcher Anon know it is jumping around  Scheduled to see Dr. Fletcher Anon 12/17 Instructed patient to continue to monitor blood pressure until then  Instructed patient to go to the ED if her situation becomes emergent before then

## 2014-08-18 ENCOUNTER — Encounter: Payer: Self-pay | Admitting: Cardiovascular Disease

## 2014-08-18 ENCOUNTER — Ambulatory Visit (INDEPENDENT_AMBULATORY_CARE_PROVIDER_SITE_OTHER): Payer: Commercial Managed Care - HMO | Admitting: Cardiovascular Disease

## 2014-08-18 VITALS — BP 142/48 | HR 66 | Ht 62.0 in | Wt 99.5 lb

## 2014-08-18 DIAGNOSIS — E785 Hyperlipidemia, unspecified: Secondary | ICD-10-CM

## 2014-08-18 DIAGNOSIS — I701 Atherosclerosis of renal artery: Secondary | ICD-10-CM

## 2014-08-18 DIAGNOSIS — I10 Essential (primary) hypertension: Secondary | ICD-10-CM

## 2014-08-18 DIAGNOSIS — I739 Peripheral vascular disease, unspecified: Secondary | ICD-10-CM

## 2014-08-18 NOTE — Patient Instructions (Signed)
Continue same medications.   Your physician wants you to follow-up in: 6 months.  You will receive a reminder letter in the mail two months in advance. If you don't receive a letter, please call our office to schedule the follow-up appointment.  

## 2014-08-18 NOTE — Progress Notes (Signed)
HPI  This is a 73 year old female who is here today for a followup visit regarding refractory hypertension s/p right renal artery stenting and peripheral arterial disease. She has no previous cardiac history. 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  Previous cardiac evaluation in 2013 for dyspnea included  a nuclear stress test which showed no evidence of ischemia. Echocardiogram showed normal LV systolic function with mild diastolic dysfunction and borderline pulmonary hypertension. It was felt that her dyspnea was due to COPD. She has refractory hypertension with intolerance to multiple medications. She underwent renal artery angiography in April 2014 with showed severe bilateral ostial renal artery stenosis with extensive atherosclerosis in the abdominal aorta. I performed stenting of right renal artery without complications. Blood pressure improved since that time.  Renal artery duplex in August 2015 showed patent stent. There was moderate left renal artery stenosis. She was diagnosed with recurrent left leg DVT and currently she is on Xarelto.  She continues to have significant bilateral calf claudication with 50 feet of walking but reports stable symptoms. She cut down on tobacco use. She reports labile hypertension with occasional high blood pressure readings up to 170 mmHg. She is usually asymptomatic during these episodes.    Allergies  Allergen Reactions  . Trimethobenzamide Other (See Comments)    Eye allergy, whites of eyes turned red, for about two weeks, there after.    . Ceftin [Cefuroxime Axetil] Other (See Comments)    Unknown   . Chlorthalidone     hyponatremia  . Hctz [Hydrochlorothiazide]     Hyponatremia   . Tequin [Gatifloxacin] Itching      Current Outpatient Prescriptions on File Prior to Visit  Medication Sig Dispense Refill  . acetaminophen (TYLENOL) 500 MG tablet Take 1,000 mg by mouth every 6 (six) hours as needed for pain.    Marland Kitchen albuterol-ipratropium (COMBIVENT) 18-103 MCG/ACT inhaler Inhale 2 puffs into the lungs every 6 (six) hours as needed.    . cyanocobalamin (,VITAMIN B-12,) 1000 MCG/ML injection Inject into the muscle.    . diltiazem (CARDIZEM CD) 240 MG 24 hr capsule Take 1 capsule (240 mg total) by mouth daily. 30 capsule 6  . fluticasone (FLONASE) 50 MCG/ACT nasal spray Place 2 sprays into the nose daily.     . hydrALAZINE (APRESOLINE) 100 MG tablet TAKE 1 TABLET BY MOUTH THREE TIMES DAILY 90 tablet 3  . losartan (COZAAR) 100 MG tablet Take 1 tablet (100 mg total) by mouth daily. 90 tablet 3  . pantoprazole (PROTONIX) 40 MG tablet Take 40 mg by mouth daily.    . Ranitidine HCl (ZANTAC PO) Take by mouth daily.    . rivaroxaban (XARELTO) 20 MG TABS tablet Take 1 tablet (20 mg total) by mouth daily with supper. 30 tablet 5   No current facility-administered medications on file prior to visit.     Past Medical History  Diagnosis Date  . Asthma   . Peripheral vascular disease     a. h/o iliac stents 2010. b. RAS as below.  . History of skin ulcer   . Hypertension   . GERD (gastroesophageal reflux disease)   . Squamous cell carcinoma of oropharynx 2008     status post chemotherapy and radiation therapy in 2008  . Cancer of esophagus 2008    s/p chemo and  radiation  . History of stomach ulcers   . Hyponatremia     Previously related to HCTZ/chlorthalidone  . Clotting disorder     left leg  . Chronic kidney disease      Past Surgical History  Procedure Laterality Date  . Peripheral arterial stent graft Bilateral     x2 STENTS BOTH LEGS  . Anterior cervical decomp/discectomy fusion  ~ 2006    "put in spacer" (12/02/2012)  . Portacath placement  2008  . Anterior cervical decomp/discectomy fusion   10/07/2012    Procedure: ANTERIOR CERVICAL DECOMPRESSION/DISCECTOMY FUSION 2 LEVEL/HARDWARE REMOVAL;  Surgeon: Ophelia Charter, MD;  Location: [place] [facility];  Service: Neurosurgery;  Laterality: N/A;  Cervical three-four, cervical four-five Anterior cervical decompression/diskectomy/fusion/interbody plating and prosthesis/removal of spinal concept plate  . Cataract extraction w/ intraocular lens  implant, bilateral  ~2005  . Renal artery stent Right 12/02/2012    Successful angioplasty and bare-metal stent placement to the ostial right renal artery  . Tonsillectomy  ~ 1946  . Breast cyst excision Right ?1980's    negative  . Renal angiogram N/A 12/02/2012    Procedure: RENAL ANGIOGRAM;  Surgeon: Wellington Hampshire, MD;  Location: [facility] C[facility];  Service: Cardiovascular;  Laterality: N/A;     Family History  Problem Relation Age of Onset  . Diabetes Sister   . Cancer Neg Hx   . Stroke Neg Hx      History   Social History  . Marital Status: Widowed    Spouse Name: N/A    Number of Children: N/A  . Years of Education: N/A   Occupational History  . Not on file.   Social History Main Topics  . Smoking status: Current Every Day Smoker -- 0.25 packs/day for 50 years    Types: Cigarettes  . Smokeless tobacco: Never Used     Comment: 12/02/2012 offered smoking cessation materials; Electronic cigarette  . Alcohol Use: 3.0 oz/week    5 Glasses of wine per week     Comment: wine occasional  . Drug Use: No  . Sexual Activity: Not Currently   Other Topics Concern  . Not on file   Social History Narrative      PHYSICAL EXAM   BP 142/48 mmHg  Pulse 66  Ht 5\' 2"  (1.575 m)  Wt 99 lb 8 oz (45.133 kg)  BMI 18.19 kg/m2  SpO2 98% Constitutional: She is oriented to person, place, and time. She appears well-developed and well-nourished. No distress.  HENT: No nasal discharge.  Head: Normocephalic and atraumatic.  Eyes: Pupils are equal and round. Right eye exhibits no discharge. Left eye  exhibits no discharge.  Neck: Normal range of motion. Neck supple. No JVD present. No thyromegaly present.  Cardiovascular: Normal rate, regular rhythm, normal heart sounds. Exam reveals no gallop and no friction rub. No murmur heard.  Pulmonary/Chest: Effort normal and breath sounds normal. No stridor. No respiratory distress. She has no wheezes. She has no rales. She exhibits no tenderness.  Abdominal: Soft. Bowel sounds are normal. She exhibits no distension. There is no tenderness. There is no rebound and no guarding.  Musculoskeletal: Normal range of motion. She exhibits +1 edema and no tenderness.  Neurological: She is alert and oriented to person, place, and time. Coordination normal.  Skin: Skin is warm and dry. No rash noted. She is not diaphoretic. No erythema. No pallor.  Psychiatric: She has a normal mood and affect. Her behavior is normal. Judgment and thought content  normal.     ASSESSMENT AND PLAN

## 2014-08-18 NOTE — Assessment & Plan Note (Signed)
She has no rest pain. Most recent ABI was 0.46 bilaterally with evidence of bilateral SFA occlusion. She reports stable claudication and prefers to continue with medical therapy.

## 2014-08-18 NOTE — Assessment & Plan Note (Signed)
Blood pressure is elevated but appears to be reasonable overall. She tends to have labile hypertension. Thus, I did not make any changes in her medications today to avoid hypotension.

## 2014-08-18 NOTE — Assessment & Plan Note (Addendum)
The patient refused treatment with a statin in the past after a short trial of atorvastatin.

## 2014-08-18 NOTE — Assessment & Plan Note (Signed)
Most recent Doppler in August showed patent right renal artery stent and moderate left renal artery stenosis. Recheck in August 2016.     

## 2014-09-08 ENCOUNTER — Telehealth: Payer: Self-pay

## 2014-09-08 ENCOUNTER — Other Ambulatory Visit: Payer: Self-pay | Admitting: Internal Medicine

## 2014-09-08 MED ORDER — APIXABAN 5 MG PO TABS: 5.0000 mg | ORAL_TABLET | Freq: Two times a day (BID) | ORAL | Status: DC

## 2014-09-08 NOTE — Telephone Encounter (Signed)
OK stop xarelto, start Eliquis, Sent RX to pharmacy

## 2014-09-08 NOTE — Telephone Encounter (Signed)
Pt thinks Xarelto is causing pt to have itching on tops of feet, thighs and back; no rash. Itching started when pt began taking the Xarelto. Have not added to adverse reaction or allergy med list until OKed by [name] NP. Pt request to substitute Eliquis to [employer]. Pt request cb.

## 2014-09-09 NOTE — Telephone Encounter (Signed)
Pt is aware as instructed 

## 2014-09-14 ENCOUNTER — Ambulatory Visit: Payer: Self-pay | Admitting: Internal Medicine

## 2014-09-14 LAB — CBC CANCER CENTER
BASOS ABS: 0.1 x10 3/mm (ref 0.0–0.1)
Basophil %: 1.4 %
Eosinophil #: 0 x10 3/mm (ref 0.0–0.7)
Eosinophil %: 0.6 %
HCT: 41.7 % (ref 35.0–47.0)
HGB: 13.4 g/dL (ref 12.0–16.0)
LYMPHS PCT: 15 %
Lymphocyte #: 0.8 x10 3/mm — ABNORMAL LOW (ref 1.0–3.6)
MCH: 29.7 pg (ref 26.0–34.0)
MCHC: 32.3 g/dL (ref 32.0–36.0)
MCV: 92 fL (ref 80–100)
MONOS PCT: 10.8 %
Monocyte #: 0.6 x10 3/mm (ref 0.2–0.9)
NEUTROS ABS: 3.7 x10 3/mm (ref 1.4–6.5)
Neutrophil %: 72.2 %
PLATELETS: 240 x10 3/mm (ref 150–440)
RBC: 4.52 10*6/uL (ref 3.80–5.20)
RDW: 17.5 % — AB (ref 11.5–14.5)
WBC: 5.2 x10 3/mm (ref 3.6–11.0)

## 2014-09-14 LAB — HEPATIC FUNCTION PANEL A (ARMC)
ALBUMIN: 3.4 g/dL (ref 3.4–5.0)
Alkaline Phosphatase: 71 U/L
BILIRUBIN DIRECT: 0.2 mg/dL (ref 0.0–0.2)
BILIRUBIN TOTAL: 0.6 mg/dL (ref 0.2–1.0)
SGOT(AST): 16 U/L (ref 15–37)
SGPT (ALT): 15 U/L
Total Protein: 6.2 g/dL — ABNORMAL LOW (ref 6.4–8.2)

## 2014-09-14 LAB — CALCIUM: Calcium, Total: 8 mg/dL — ABNORMAL LOW (ref 8.5–10.1)

## 2014-09-14 LAB — CREATININE, SERUM
CREATININE: 1.34 mg/dL — AB (ref 0.60–1.30)
GFR CALC AF AMER: 50 — AB
GFR CALC NON AF AMER: 41 — AB

## 2014-09-26 ENCOUNTER — Ambulatory Visit: Payer: Self-pay | Admitting: Podiatry

## 2014-09-27 LAB — PROTIME-INR
INR: 0.9
Prothrombin Time: 12.3 secs (ref 11.5–14.7)

## 2014-09-27 LAB — APTT: Activated PTT: 28.6 secs (ref 23.6–35.9)

## 2014-10-03 ENCOUNTER — Encounter: Payer: Self-pay | Admitting: Podiatry

## 2014-10-03 ENCOUNTER — Ambulatory Visit (INDEPENDENT_AMBULATORY_CARE_PROVIDER_SITE_OTHER): Payer: Medicare Other

## 2014-10-03 ENCOUNTER — Ambulatory Visit: Payer: Self-pay | Admitting: Internal Medicine

## 2014-10-03 ENCOUNTER — Ambulatory Visit (INDEPENDENT_AMBULATORY_CARE_PROVIDER_SITE_OTHER): Payer: Medicare Other | Admitting: Podiatry

## 2014-10-03 VITALS — BP 112/64 | HR 74 | Resp 12

## 2014-10-03 DIAGNOSIS — M722 Plantar fascial fibromatosis: Secondary | ICD-10-CM

## 2014-10-03 DIAGNOSIS — R52 Pain, unspecified: Secondary | ICD-10-CM

## 2014-10-03 NOTE — Progress Notes (Signed)
   Subjective:    Patient ID: Jennifer Simpson, female    DOB: July 27, 1941, 74 y.o.   MRN: [id number]  HPI PT STATED  B/L BOTTOM OF THE HEEL BEEN PAINFUL FOR 6 MONTHS. THE HEEL ARE NOT WORSE BUT NOT GETTING ANY BETTER AND GET AGGRAVATED BY WALKING. TRIED OTC INSERTS BUT NO HELP.   Review of Systems  HENT: Positive for sinus pressure.   Eyes: Positive for itching.  Respiratory: Positive for shortness of breath.   Cardiovascular: Positive for leg swelling.  Musculoskeletal: Positive for gait problem.  Hematological: Bruises/bleeds easily.  All other systems reviewed and are negative.      Objective:   Physical Exam: I have reviewed her past medical history medications allergy surgery social history and review of systems. Pulses are strongly palpable bilateral. Neurologic sensorium is intact per Semmes-Weinstein monofilament. Deep tendon reflexes are intact bilateral and muscle strength is 5 over 5 or so flex plantar flexors and inverters everters OF the musculature is intact. Orthopedic evaluation of his trace pain on palpation medial calcaneal tubercle bilateral heels. Radiographic evaluation confirms plantar distally oriented calcaneal heel spurs with soft tissue increase in density of plantar fascial calcaneal insertion sites bilateral.        Assessment & Plan:  Assessment: Plantar fasciitis bilateral.  Plan: Injected the bilateral heels today with Kenalog and local anesthetic. Secondary to the use of Coumadin we decided not to use any steroidal or nonsteroidal oral medication. I also placed her in plantar fascial braces bilateral and a night splint to her worse foot. We discussed appropriate shoe gear stretching exercises ice therapy and shoe gear modifications.

## 2014-10-04 ENCOUNTER — Telehealth: Payer: Self-pay | Admitting: *Deleted

## 2014-10-04 NOTE — Telephone Encounter (Signed)
Pt states she had trouble with the plantar fascial braces placement.  I called pt, she stated that she came by and the assistant showed again how to put the braces on.

## 2014-10-14 ENCOUNTER — Ambulatory Visit (INDEPENDENT_AMBULATORY_CARE_PROVIDER_SITE_OTHER): Payer: Medicare Other | Admitting: Cardiovascular Disease

## 2014-10-14 ENCOUNTER — Encounter: Payer: Self-pay | Admitting: Cardiovascular Disease

## 2014-10-14 VITALS — BP 172/55 | HR 61 | Ht 62.0 in | Wt 95.5 lb

## 2014-10-14 DIAGNOSIS — I10 Essential (primary) hypertension: Secondary | ICD-10-CM

## 2014-10-14 DIAGNOSIS — I739 Peripheral vascular disease, unspecified: Secondary | ICD-10-CM

## 2014-10-14 DIAGNOSIS — I701 Atherosclerosis of renal artery: Secondary | ICD-10-CM

## 2014-10-14 MED ORDER — SPIRONOLACTONE 25 MG PO TABS: 25.0000 mg | ORAL_TABLET | Freq: Every day | ORAL | Status: DC

## 2014-10-14 NOTE — Patient Instructions (Signed)
Stop Hydralazine  Start Spironolactone (Aldactone) 25 mg once daily.   Labs in 1 week.   Follow up in 2 months.

## 2014-10-14 NOTE — Progress Notes (Signed)
HPI  This is a 74 year old female who is here today for a followup visit regarding refractory hypertension s/p right renal artery stenting and peripheral arterial disease. She has no previous cardiac history. 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  Previous cardiac evaluation in 2013 for dyspnea included  a nuclear stress test which showed no evidence of ischemia. Echocardiogram showed normal LV systolic function with mild diastolic dysfunction and borderline pulmonary hypertension. It was felt that her dyspnea was due to COPD. She has refractory hypertension with intolerance to multiple medications. She underwent renal artery angiography in April 2014 with showed severe bilateral ostial renal artery stenosis with extensive atherosclerosis in the abdominal aorta. I performed stenting of right renal artery without complications.  Renal artery duplex in August 2015 showed patent stent. There was moderate left renal artery stenosis. She complains of nausea when she takes hydralazine with poor appetite. She continues to lose weight. Her weight is now down to 96 pounds. She has intolerance to multiple blood pressure medications.    Allergies  Allergen Reactions  . Trimethobenzamide Other (See Comments)    Eye allergy, whites of eyes turned red, for about two weeks, there after.    . Ceftin [Cefuroxime Axetil] Other (See Comments)    Unknown   . Chlorthalidone     hyponatremia  . Hctz [Hydrochlorothiazide]     Hyponatremia   . Tequin [Gatifloxacin] Itching     Current Outpatient Prescriptions on File Prior to Visit  Medication Sig Dispense Refill  . acetaminophen (TYLENOL) 500 MG tablet Take 1,000 mg by mouth every 6 (six) hours as needed for pain.    Marland Kitchen  albuterol-ipratropium (COMBIVENT) 18-103 MCG/ACT inhaler Inhale 2 puffs into the lungs every 6 (six) hours as needed.    . cyanocobalamin (,VITAMIN B-12,) 1000 MCG/ML injection Inject into the muscle.    . diltiazem (CARDIZEM CD) 240 MG 24 hr capsule Take 1 capsule (240 mg total) by mouth daily. 30 capsule 6  . fluticasone (FLONASE) 50 MCG/ACT nasal spray Place 2 sprays into the nose daily.     . hydrALAZINE (APRESOLINE) 100 MG tablet TAKE 1 TABLET BY MOUTH THREE TIMES DAILY 90 tablet 3  . losartan (COZAAR) 100 MG tablet Take 1 tablet (100 mg total) by mouth daily. 90 tablet 3  . pantoprazole (PROTONIX) 40 MG tablet Take 40 mg by mouth daily.    . Ranitidine HCl (ZANTAC PO) Take by mouth daily.    Marland Kitchen warfarin (COUMADIN) 5 MG tablet   0   No current facility-administered medications on file prior to visit.     Past Medical History  Diagnosis Date  . Asthma   . Peripheral vascular disease     a. h/o iliac stents 2010. b. RAS as below.  . History of skin ulcer   . Hypertension   . GERD (gastroesophageal reflux disease)   . Squamous cell carcinoma of oropharynx 2008     status post chemotherapy and radiation therapy in 2008  . Cancer of esophagus 2008    s/p chemo and radiation  . History of stomach ulcers   . Hyponatremia     Previously related to HCTZ/chlorthalidone  . Clotting disorder     left leg  . Chronic kidney disease      Past Surgical History  Procedure Laterality  Date  . Peripheral arterial stent graft Bilateral     x2 STENTS BOTH LEGS  . Anterior cervical decomp/discectomy fusion  ~ 2006    "put in spacer" (12/02/2012)  . Portacath placement  2008  . Anterior cervical decomp/discectomy fusion  10/07/2012    Procedure: ANTERIOR CERVICAL DECOMPRESSION/DISCECTOMY FUSION 2 LEVEL/HARDWARE REMOVAL;  Surgeon: Ophelia Charter, MD;  Location: [facility];  Service: Neurosurgery;  Laterality: N/A;  Cervical three-four, cervical four-five Anterior cervical  decompression/diskectomy/fusion/interbody plating and prosthesis/removal of spinal concept plate  . Cataract extraction w/ intraocular lens  implant, bilateral  ~2005  . Renal artery stent Right 12/02/2012    Successful angioplasty and bare-metal stent placement to the ostial right renal artery  . Tonsillectomy  ~ 1946  . Breast cyst excision Right ?1980's    negative  . Renal angiogram N/A 12/02/2012    Procedure: RENAL ANGIOGRAM;  Surgeon: Wellington Hampshire, MD;  Location: [facility];  Service: Cardiovascular;  Laterality: N/A;     Family History  Problem Relation Age of Onset  . Diabetes Sister   . Cancer Neg Hx   . Stroke Neg Hx      History   Social History  . Marital Status: Widowed    Spouse Name: N/A  . Number of Children: N/A  . Years of Education: N/A   Occupational History  . Not on file.   Social History Main Topics  . Smoking status: Current Every Day Smoker -- 0.25 packs/day for 50 years    Types: Cigarettes  . Smokeless tobacco: Never Used     Comment: 12/02/2012 offered smoking cessation materials; Electronic cigarette  . Alcohol Use: 3.0 oz/week    5 Glasses of wine per week     Comment: wine occasional  . Drug Use: No  . Sexual Activity: Not Currently   Other Topics Concern  . Not on file   Social History Narrative      PHYSICAL EXAM   BP 172/55 mmHg  Pulse 61  Ht 5\' 2"  (1.575 m)  Wt 95 lb 8 oz (43.319 kg)  BMI 17.46 kg/m2 Constitutional: She is oriented to person, place, and time. She appears well-developed and well-nourished. No distress.  HENT: No nasal discharge.  Head: Normocephalic and atraumatic.  Eyes: Pupils are equal and round. Right eye exhibits no discharge. Left eye exhibits no discharge.  Neck: Normal range of motion. Neck supple. No JVD present. No thyromegaly present.  Cardiovascular: Normal rate, regular rhythm, normal heart sounds. Exam reveals no gallop and no friction rub. No murmur heard.  Pulmonary/Chest: Effort normal  and breath sounds normal. No stridor. No respiratory distress. She has no wheezes. She has no rales. She exhibits no tenderness.  Abdominal: Soft. Bowel sounds are normal. She exhibits no distension. There is no tenderness. There is no rebound and no guarding.  Musculoskeletal: Normal range of motion. She exhibits +1 edema and no tenderness.  Neurological: She is alert and oriented to person, place, and time. Coordination normal.  Skin: Skin is warm and dry. No rash noted. She is not diaphoretic. No erythema. No pallor.  Psychiatric: She has a normal mood and affect. Her behavior is normal. Judgment and thought content normal.   ATF:TDDUK  Rhythm  -Right atrial enlargement.   -Poor R-wave progression -may be secondary to pulmonary disease   consider old anterior infarct.   -  Nonspecific T-abnormality.   Right atrial abnormality and rotation -possible pulmonary disease.   ASSESSMENT AND PLAN

## 2014-10-17 NOTE — Assessment & Plan Note (Signed)
Blood pressure is elevated. She reports significant GI side effects of hydralazine. Unfortunately, she has intolerance to multiple blood pressure medication. Thiazide diuretics were associated with hyponatremia. She did not tolerate a beta blocker due to worsening dyspnea. I elected to discontinue hydralazine and start spironolactone 25 mg once daily. Check basic metabolic profile in one week.

## 2014-10-17 NOTE — Assessment & Plan Note (Signed)
Most recent Doppler in August showed patent right renal artery stent and moderate left renal artery stenosis. Recheck in August 2016.

## 2014-10-17 NOTE — Assessment & Plan Note (Signed)
She has stable claudication. She is at risk of mesenteric ischemia which might be contributing to weight loss. However, she denies any postprandial pain.

## 2014-10-20 ENCOUNTER — Other Ambulatory Visit: Payer: Medicare Other

## 2014-10-20 ENCOUNTER — Other Ambulatory Visit: Payer: Self-pay | Admitting: Cardiovascular Disease

## 2014-10-20 DIAGNOSIS — I10 Essential (primary) hypertension: Secondary | ICD-10-CM

## 2014-10-21 ENCOUNTER — Telehealth: Payer: Self-pay | Admitting: *Deleted

## 2014-10-21 LAB — BASIC METABOLIC PANEL
BUN/Creatinine Ratio: 11 (ref 11–26)
BUN: 13 mg/dL (ref 8–27)
CALCIUM: 8.9 mg/dL (ref 8.7–10.3)
CO2: 18 mmol/L (ref 18–29)
CREATININE: 1.17 mg/dL — AB (ref 0.57–1.00)
Chloride: 96 mmol/L — ABNORMAL LOW (ref 97–108)
GFR, EST AFRICAN AMERICAN: 53 mL/min/{1.73_m2} — AB (ref >59)
GFR, EST NON AFRICAN AMERICAN: 46 mL/min/{1.73_m2} — AB (ref >59)
Glucose: 99 mg/dL (ref 65–99)
Potassium: 4.4 mmol/L (ref 3.5–5.2)
SODIUM: 133 mmol/L — AB (ref 134–144)

## 2014-10-21 MED ORDER — HYDRALAZINE HCL 25 MG PO TABS: 25.0000 mg | ORAL_TABLET | Freq: Three times a day (TID) | ORAL | Status: DC

## 2014-10-21 NOTE — Telephone Encounter (Signed)
Resume hydralazine but at a smaller dose of 25 mg 3 times daily. Continue other medications.

## 2014-10-21 NOTE — Telephone Encounter (Signed)
Patient stated her blood pressure has been elevated since her med change  Her BP last night was 205/70 It has not been under 175/70 since her med change  She has been having headaches

## 2014-10-21 NOTE — Telephone Encounter (Signed)
Reviewed instructions with patient per Dr. Fletcher Anon  Patient verbalized understanding

## 2014-10-26 ENCOUNTER — Ambulatory Visit: Payer: Self-pay | Admitting: Gastroenterology

## 2014-10-28 ENCOUNTER — Other Ambulatory Visit: Payer: Self-pay

## 2014-10-28 MED ORDER — PANTOPRAZOLE SODIUM 40 MG PO TBEC: 40.0000 mg | DELAYED_RELEASE_TABLET | Freq: Every day | ORAL | Status: DC

## 2014-10-28 NOTE — Telephone Encounter (Signed)
Pt request refill pantoprazole to [facility]. Pt seen 05/23/14 and has f/u appt 11/21/14. Advised pt done.

## 2014-10-31 ENCOUNTER — Ambulatory Visit (INDEPENDENT_AMBULATORY_CARE_PROVIDER_SITE_OTHER): Payer: Medicare Other | Admitting: Podiatry

## 2014-10-31 VITALS — BP 141/68 | HR 72 | Resp 20

## 2014-10-31 DIAGNOSIS — M722 Plantar fascial fibromatosis: Secondary | ICD-10-CM | POA: Diagnosis not present

## 2014-10-31 NOTE — Progress Notes (Signed)
She presents today for follow-up of her plantar fasciitis. She states that her feet are 100% better and no longer have pain.  Objective: Vital signs are stable she is alert and oriented 3. Pulses are palpable bilateral. Minimal pain on palpation medial calcaneal jungles bilateral.  Assessment: Plantar fasciitis bilateral.  Plan: Discussed conservative therapies which she will continue for the next month. Follow-up with me should

## 2014-11-01 ENCOUNTER — Ambulatory Visit
Admit: 2014-11-01 | Disposition: A | Discharge status: Home / Self Care | Payer: Self-pay | Attending: Internal Medicine | Admitting: Internal Medicine

## 2014-11-11 ENCOUNTER — Ambulatory Visit (INDEPENDENT_AMBULATORY_CARE_PROVIDER_SITE_OTHER): Payer: Medicare Other | Admitting: Cardiovascular Disease

## 2014-11-11 ENCOUNTER — Encounter: Payer: Self-pay | Admitting: Cardiovascular Disease

## 2014-11-11 VITALS — BP 139/55 | HR 68 | Ht 62.0 in | Wt 93.0 lb

## 2014-11-11 DIAGNOSIS — I150 Renovascular hypertension: Secondary | ICD-10-CM

## 2014-11-11 DIAGNOSIS — I739 Peripheral vascular disease, unspecified: Secondary | ICD-10-CM

## 2014-11-11 DIAGNOSIS — I701 Atherosclerosis of renal artery: Secondary | ICD-10-CM

## 2014-11-11 NOTE — Progress Notes (Signed)
HPI  This is a 74 year old female who is here today for a followup visit regarding refractory hypertension s/p right renal artery stenting and peripheral arterial disease. She has no previous cardiac history. 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  Previous cardiac evaluation in 2013 for dyspnea included  a nuclear stress test which showed no evidence of ischemia. Echocardiogram showed normal LV systolic function with mild diastolic dysfunction and borderline pulmonary hypertension. It was felt that her dyspnea was due to COPD. She has refractory hypertension with intolerance to multiple medications. She underwent renal artery angiography in April 2014 with showed severe bilateral ostial renal artery stenosis with extensive atherosclerosis in the abdominal aorta. I performed stenting of right renal artery without complications.  Renal artery duplex in August 2015 showed patent stent. There was moderate left renal artery stenosis. She complained of nausea whenever she took hydralazine . This was associated with weight loss. Thus, during last visit I discontinued hydralazine and started spironolactone 25 mg once daily. Blood pressure increased after that so we resumed hydralazine but at a smaller dose of 25 mg 3 times daily. She is feeling significantly better overall.   Allergies  Allergen Reactions  . Trimethobenzamide Other (See Comments)    Eye allergy, whites of eyes turned red, for about two weeks, there after.    . Ceftin [Cefuroxime Axetil] Other (See Comments)    Unknown   . Chlorthalidone     hyponatremia  . Hctz [Hydrochlorothiazide]     Hyponatremia   . Tequin [Gatifloxacin] Itching     Current Outpatient Prescriptions on File Prior to Visit  Medication Sig  Dispense Refill  . acetaminophen (TYLENOL) 500 MG tablet Take 1,000 mg by mouth every 6 (six) hours as needed for pain.    Marland Kitchen albuterol-ipratropium (COMBIVENT) 18-103 MCG/ACT inhaler Inhale 2 puffs into the lungs every 6 (six) hours as needed.    . cyanocobalamin (,VITAMIN B-12,) 1000 MCG/ML injection Inject into the muscle.    . diltiazem (CARDIZEM CD) 240 MG 24 hr capsule Take 1 capsule (240 mg total) by mouth daily. 30 capsule 6  . fluticasone (FLONASE) 50 MCG/ACT nasal spray Place 2 sprays into the nose daily.     . hydrALAZINE (APRESOLINE) 25 MG tablet Take 1 tablet (25 mg total) by mouth 3 (three) times daily. 90 tablet 3  . losartan (COZAAR) 100 MG tablet Take 1 tablet (100 mg total) by mouth daily. 90 tablet 3  . pantoprazole (PROTONIX) 40 MG tablet Take 1 tablet (40 mg total) by mouth daily. 30 tablet 0  . Ranitidine HCl (ZANTAC PO) Take by mouth daily.    Marland Kitchen spironolactone (ALDACTONE) 25 MG tablet Take 1 tablet (25 mg total) by mouth daily. 30 tablet 6  . warfarin (COUMADIN) 5 MG tablet as directed.   0   No current facility-administered medications on file prior to visit.     Past Medical History  Diagnosis Date  . Asthma   . Peripheral vascular disease     a. h/o iliac stents 2010. b. RAS as below.  . History of skin ulcer   . Hypertension   . GERD (gastroesophageal reflux disease)   . Squamous cell carcinoma of oropharynx 2008     status post chemotherapy and radiation therapy in 2008  . Cancer of esophagus 2008  s/p chemo and radiation  . History of stomach ulcers   . Hyponatremia     Previously related to HCTZ/chlorthalidone  . Clotting disorder     left leg  . Chronic kidney disease      Past Surgical History  Procedure Laterality Date  . Peripheral arterial stent graft Bilateral     x2 STENTS BOTH LEGS  . Anterior cervical decomp/discectomy fusion  ~ 2006    "put in spacer" (12/02/2012)  . Portacath placement  2008  . Anterior cervical decomp/discectomy  fusion  10/07/2012    Procedure: ANTERIOR CERVICAL DECOMPRESSION/DISCECTOMY FUSION 2 LEVEL/HARDWARE REMOVAL;  Surgeon: Ophelia Charter, MD;  Location: [facility];  Service: Neurosurgery;  Laterality: N/A;  Cervical three-four, cervical four-five Anterior cervical decompression/diskectomy/fusion/interbody plating and prosthesis/removal of spinal concept plate  . Cataract extraction w/ intraocular lens  implant, bilateral  ~2005  . Renal artery stent Right 12/02/2012    Successful angioplasty and bare-metal stent placement to the ostial right renal artery  . Tonsillectomy  ~ 1946  . Breast cyst excision Right ?1980's    negative  . Renal angiogram N/A 12/02/2012    Procedure: RENAL ANGIOGRAM;  Surgeon: Wellington Hampshire, MD;  Location: [facility];  Service: Cardiovascular;  Laterality: N/A;     Family History  Problem Relation Age of Onset  . Diabetes Sister   . Cancer Neg Hx   . Stroke Neg Hx      History   Social History  . Marital Status: Widowed    Spouse Name: N/A  . Number of Children: N/A  . Years of Education: N/A   Occupational History  . Not on file.   Social History Main Topics  . Smoking status: Current Some Day Smoker -- 0.25 packs/day for 50 years    Types: Cigarettes  . Smokeless tobacco: Never Used     Comment: 12/02/2012 offered smoking cessation materials; Electronic cigarette  . Alcohol Use: 3.0 oz/week    5 Glasses of wine per week     Comment: wine occasional  . Drug Use: No  . Sexual Activity: Not Currently   Other Topics Concern  . Not on file   Social History Narrative      PHYSICAL EXAM   BP 139/55 mmHg  Pulse 68  Ht 5\' 2"  (1.575 m)  Wt 93 lb (42.185 kg)  BMI 17.01 kg/m2 Constitutional: She is oriented to person, place, and time. She appears well-developed and well-nourished. No distress.  HENT: No nasal discharge.  Head: Normocephalic and atraumatic.  Eyes: Pupils are equal and round. Right eye exhibits no discharge. Left eye exhibits  no discharge.  Neck: Normal range of motion. Neck supple. No JVD present. No thyromegaly present.  Cardiovascular: Normal rate, regular rhythm, normal heart sounds. Exam reveals no gallop and no friction rub. No murmur heard.  Pulmonary/Chest: Effort normal and breath sounds normal. No stridor. No respiratory distress. She has no wheezes. She has no rales. She exhibits no tenderness.  Abdominal: Soft. Bowel sounds are normal. She exhibits no distension. There is no tenderness. There is no rebound and no guarding.  Musculoskeletal: Normal range of motion. She exhibits +1 edema and no tenderness.  Neurological: She is alert and oriented to person, place, and time. Coordination normal.  Skin: Skin is warm and dry. No rash noted. She is not diaphoretic. No erythema. No pallor.  Psychiatric: She has a normal mood and affect. Her behavior is normal. Judgment and thought content normal.  ASSESSMENT AND PLAN

## 2014-11-11 NOTE — Assessment & Plan Note (Signed)
She has stable claudication. She is at risk of mesenteric ischemia which might be contributing to weight loss. However, she denies any postprandial pain.

## 2014-11-11 NOTE — Assessment & Plan Note (Signed)
Blood pressure is now well controlled on current medications. She has mild chronic kidney disease and thus I plan on repeat basic metabolic profile during next visit.

## 2014-11-11 NOTE — Assessment & Plan Note (Signed)
Most recent Doppler in August showed patent right renal artery stent and moderate left renal artery stenosis. Recheck in August 2016.

## 2014-11-11 NOTE — Patient Instructions (Signed)
Continue same medications.  Follow up in 3 months.  

## 2014-11-16 LAB — CREATININE, SERUM: Creatine, Serum: 1.35

## 2014-11-21 ENCOUNTER — Ambulatory Visit: Payer: Commercial Managed Care - HMO | Admitting: Internal Medicine

## 2014-11-22 ENCOUNTER — Encounter: Payer: Self-pay | Admitting: Internal Medicine

## 2014-11-22 ENCOUNTER — Ambulatory Visit (INDEPENDENT_AMBULATORY_CARE_PROVIDER_SITE_OTHER): Payer: Medicare Other | Admitting: Internal Medicine

## 2014-11-22 VITALS — BP 141/62 | HR 58 | Temp 98.0°F | Wt 93.0 lb

## 2014-11-22 DIAGNOSIS — I739 Peripheral vascular disease, unspecified: Secondary | ICD-10-CM

## 2014-11-22 DIAGNOSIS — E785 Hyperlipidemia, unspecified: Secondary | ICD-10-CM

## 2014-11-22 DIAGNOSIS — E538 Deficiency of other specified B group vitamins: Secondary | ICD-10-CM

## 2014-11-22 DIAGNOSIS — I150 Renovascular hypertension: Secondary | ICD-10-CM | POA: Diagnosis not present

## 2014-11-22 DIAGNOSIS — K219 Gastro-esophageal reflux disease without esophagitis: Secondary | ICD-10-CM

## 2014-11-22 DIAGNOSIS — I82402 Acute embolism and thrombosis of unspecified deep veins of left lower extremity: Secondary | ICD-10-CM

## 2014-11-22 DIAGNOSIS — J449 Chronic obstructive pulmonary disease, unspecified: Secondary | ICD-10-CM

## 2014-11-22 DIAGNOSIS — E46 Unspecified protein-calorie malnutrition: Secondary | ICD-10-CM

## 2014-11-22 NOTE — Assessment & Plan Note (Signed)
Will repeat B12 today Will restart injections if levels are low

## 2014-11-22 NOTE — Assessment & Plan Note (Signed)
S/p stents Asymptomatic at this time Advised her to quit smoking

## 2014-11-22 NOTE — Progress Notes (Signed)
Subjective:    Patient ID: Jennifer Simpson, female    DOB: 1940/11/04, 74 y.o.   MRN: [id number]  HPI  Pt presents to the clinic today for 6 months follow up of chronic medical conditions.  GERD: She is currently not taking Protonix or Zantac. She reports she has been eating fermented vegetables and has not had any issues since she has been consuming these.   HLD: Diet controlled. She does not want to be put on cholesterol medication unless she absolutely has to.  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 She is following with Cardiology.  PAD: s/p stents in April 2014. She denies pain in her lower legs. She does continue to smoke.  B12 Deficiency: She has not been getting her B12 injections for the last 3 months. She would like to restart them.  History of left leg DVT: She continues on Coumadin daily. She denies left leg pain, swelling, redness or shortness of breath. Dr. Beverly Gust at [facility] is following her INR.  Protein-calorie malnutrition: Her BMI is 17. She has lost 2 lbs since her last visit. She reports poor appetite.  COPD: Symptoms controlled with Combivent daily. She does continue to smoke   Review of Systems      Past Medical History  Diagnosis Date  . Asthma   . Peripheral vascular disease     a. h/o iliac stents 2010. b. RAS as below.  . History of skin ulcer   . Hypertension   . GERD (gastroesophageal reflux disease)   . Squamous cell carcinoma of oropharynx 2008     status post chemotherapy and radiation therapy in 2008  . Cancer of esophagus 2008    s/p chemo and radiation  . History of stomach ulcers   . Hyponatremia     Previously related to HCTZ/chlorthalidone  . Clotting disorder     left leg  . Chronic kidney disease     Current Outpatient Prescriptions  Medication Sig Dispense Refill  . acetaminophen (TYLENOL) 500 MG tablet Take 1,000 mg by mouth every 6 (six) hours as needed for  pain.    Marland Kitchen albuterol-ipratropium (COMBIVENT) 18-103 MCG/ACT inhaler Inhale 2 puffs into the lungs every 6 (six) hours as needed.    . cyanocobalamin (,VITAMIN B-12,) 1000 MCG/ML injection Inject into the muscle.    . diltiazem (CARDIZEM CD) 240 MG 24 hr capsule Take 1 capsule (240 mg total) by mouth daily. 30 capsule 6  . fluticasone (FLONASE) 50 MCG/ACT nasal spray Place 2 sprays into the nose daily.     . hydrALAZINE (APRESOLINE) 25 MG tablet Take 1 tablet (25 mg total) by mouth 3 (three) times daily. 90 tablet 3  . losartan (COZAAR) 100 MG tablet Take 1 tablet (100 mg total) by mouth daily. 90 tablet 3  . pantoprazole (PROTONIX) 40 MG tablet Take 1 tablet (40 mg total) by mouth daily. 30 tablet 0  . Ranitidine HCl (ZANTAC PO) Take by mouth daily.    Marland Kitchen spironolactone (ALDACTONE) 25 MG tablet Take 1 tablet (25 mg total) by mouth daily. 30 tablet 6  . warfarin (COUMADIN) 5 MG tablet as directed.   0   No current facility-administered medications for this visit.    Allergies  Allergen Reactions  . Trimethobenzamide Other (See Comments)    Eye allergy, whites of eyes turned red, for about two weeks, there after.    . Ceftin [Cefuroxime Axetil] Other (See Comments)    Unknown   .  Chlorthalidone     hyponatremia  . Hctz [Hydrochlorothiazide]     Hyponatremia   . Tequin [Gatifloxacin] Itching    Family History  Problem Relation Age of Onset  . Diabetes Sister   . Cancer Neg Hx   . Stroke Neg Hx     History   Social History  . Marital Status: Widowed    Spouse Name: N/A  . Number of Children: N/A  . Years of Education: N/A   Occupational History  . Not on file.   Social History Main Topics  . Smoking status: Current Some Day Smoker -- 0.25 packs/day for 50 years    Types: Cigarettes  . Smokeless tobacco: Never Used     Comment: 12/02/2012 offered smoking cessation materials; Electronic cigarette  . Alcohol Use: 3.0 oz/week    5 Glasses of wine per week     Comment: wine  occasional  . Drug Use: No  . Sexual Activity: Not Currently   Other Topics Concern  . Not on file   Social History Narrative     Constitutional: Denies fever, malaise, fatigue, headache or abrupt weight changes.  Respiratory: Pt reports occasional shortness of breath. Denies difficulty breathing, cough or sputum production.   Cardiovascular: Denies chest pain, chest tightness, palpitations or swelling in the hands or feet.  Gastrointestinal: Denies abdominal pain, bloating, constipation, diarrhea or blood in the stool.  Skin: Denies redness, rashes, lesions or ulcercations.  Neurological: Denies dizziness, difficulty with memory, difficulty with speech or problems with balance and coordination.  Psych: Denies anxiety, depression, SI/HI.  No other specific complaints in a complete review of systems (except as listed in HPI above).  Objective:   Physical Exam    BP 141/62 mmHg  Pulse 58  Temp(Src) 98 F (36.7 C) (Oral)  Wt 93 lb (42.185 kg)  SpO2 98% Wt Readings from Last 3 Encounters:  11/22/14 93 lb (42.185 kg)  11/11/14 93 lb (42.185 kg)  10/14/14 95 lb 8 oz (43.319 kg)    General: Appears her stated age, well developed, well nourished in NAD. Skin: Warm, dry and intact. No rashes, lesions or ulcerations noted. Cardiovascular: Normal rate and rhythm. S1,S2 noted.  No murmur, rubs or gallops noted. No JVD or BLE edema. No carotid bruits noted. Pulmonary/Chest: Normal effort and diminished vesicular breath sounds. No respiratory distress. No wheezes, rales or ronchi noted.  Abdomen: Soft and nontender. Normal bowel sounds, no bruits noted. No distention or masses noted. Liver, spleen and kidneys non palpable. Neurological: Alert and oriented.  Psychiatric: Mood and affect normal.   BMET    Component Value Date/Time   NA 133* 10/20/2014 1603   NA 124* 12/03/2012 0425   K 4.4 10/20/2014 1603   CL 96* 10/20/2014 1603   CO2 18 10/20/2014 1603   GLUCOSE 99 10/20/2014  1603   GLUCOSE 85 12/03/2012 0425   BUN 13 10/20/2014 1603   BUN 15 12/03/2012 0425   CREATININE 1.17* 10/20/2014 1603   CALCIUM 8.9 10/20/2014 1603   GFRNONAA 46* 10/20/2014 1603   GFRAA 53* 10/20/2014 1603    Lipid Panel  No results found for: CHOL, TRIG, HDL, CHOLHDL, VLDL, LDLCALC  CBC    Component Value Date/Time   WBC CANCELED 03/18/2013 1603   WBC 5.4 12/03/2012 0425   RBC CANCELED 03/18/2013 1603   RBC 3.96 12/03/2012 0425   HGB CANCELED 03/18/2013 1603   HCT CANCELED 03/18/2013 1603   PLT 309 01/28/2013 1639   MCV 93 01/28/2013 1639  MCH 32.5 01/28/2013 1639   MCH 28.8 12/03/2012 0425   MCHC 34.9 01/28/2013 1639   MCHC 35.8 12/03/2012 0425   RDW 16.6* 01/28/2013 1639   RDW 15.2 12/03/2012 0425   LYMPHSABS CANCELED 03/18/2013 1603   EOSABS CANCELED 03/18/2013 1603   BASOSABS CANCELED 03/18/2013 1603    Hgb A1C No results found for: HGBA1C     Assessment & Plan:

## 2014-11-22 NOTE — Assessment & Plan Note (Signed)
Diet controlled Will repeat Lipid profile today

## 2014-11-22 NOTE — Assessment & Plan Note (Signed)
No issues on coumadin Will continue to follow INR's

## 2014-11-22 NOTE — Assessment & Plan Note (Signed)
I wanted to start her on an appetite stimulant like Remeron, she declines at this time Encouraged her to start drinking ensures in between her meals

## 2014-11-22 NOTE — Progress Notes (Signed)
Pre visit review using our clinic review tool, if applicable. No additional management support is needed unless otherwise documented below in the visit note. 

## 2014-11-22 NOTE — Assessment & Plan Note (Signed)
No longer taking Protonix and Zantac

## 2014-11-22 NOTE — Assessment & Plan Note (Signed)
She continues to smoke No recent flares on Combivent

## 2014-11-22 NOTE — Patient Instructions (Signed)
Fat and Cholesterol Control Diet Fat and cholesterol levels in your blood and organs are influenced by your diet. High levels of fat and cholesterol may lead to diseases of the heart, small and large blood vessels, gallbladder, liver, and pancreas. CONTROLLING FAT AND CHOLESTEROL WITH DIET Although exercise and lifestyle factors are important, your diet is key. That is because certain foods are known to raise cholesterol and others to lower it. The goal is to balance foods for their effect on cholesterol and more importantly, to replace saturated and trans fat with other types of fat, such as monounsaturated fat, polyunsaturated fat, and omega-3 fatty acids. On average, a person should consume no more than 15 to 17 g of saturated fat daily. Saturated and trans fats are considered "bad" fats, and they will raise LDL cholesterol. Saturated fats are primarily found in animal products such as meats, butter, and cream. However, that does not mean you need to give up all your favorite foods. Today, there are good tasting, low-fat, low-cholesterol substitutes for most of the things you like to eat. Choose low-fat or nonfat alternatives. Choose round or loin cuts of red meat. These types of cuts are lowest in fat and cholesterol. Chicken (without the skin), fish, veal, and ground turkey breast are great choices. Eliminate fatty meats, such as hot dogs and salami. Even shellfish have little or no saturated fat. Have a 3 oz (85 g) portion when you eat lean meat, poultry, or fish. Trans fats are also called "partially hydrogenated oils." They are oils that have been scientifically manipulated so that they are solid at room temperature resulting in a longer shelf life and improved taste and texture of foods in which they are added. Trans fats are found in stick margarine, some tub margarines, cookies, crackers, and baked goods.  When baking and cooking, oils are a great substitute for butter. The monounsaturated oils are  especially beneficial since it is believed they lower LDL and raise HDL. The oils you should avoid entirely are saturated tropical oils, such as coconut and palm.  Remember to eat a lot from food groups that are naturally free of saturated and trans fat, including fish, fruit, vegetables, beans, grains (barley, rice, couscous, bulgur wheat), and pasta (without cream sauces).  IDENTIFYING FOODS THAT LOWER FAT AND CHOLESTEROL  Soluble fiber may lower your cholesterol. This type of fiber is found in fruits such as apples, vegetables such as broccoli, potatoes, and carrots, legumes such as beans, peas, and lentils, and grains such as barley. Foods fortified with plant sterols (phytosterol) may also lower cholesterol. You should eat at least 2 g per day of these foods for a cholesterol lowering effect.  Read package labels to identify low-saturated fats, trans fat free, and low-fat foods at the supermarket. Select cheeses that have only 2 to 3 g saturated fat per ounce. Use a heart-healthy tub margarine that is free of trans fats or partially hydrogenated oil. When buying baked goods (cookies, crackers), avoid partially hydrogenated oils. Breads and muffins should be made from whole grains (whole-wheat or whole oat flour, instead of "flour" or "enriched flour"). Buy non-creamy canned soups with reduced salt and no added fats.  FOOD PREPARATION TECHNIQUES  Never deep-fry. If you must fry, either stir-fry, which uses very little fat, or use non-stick cooking sprays. When possible, broil, bake, or roast meats, and steam vegetables. Instead of putting butter or margarine on vegetables, use lemon and herbs, applesauce, and cinnamon (for squash and sweet potatoes). Use nonfat   yogurt, salsa, and low-fat dressings for salads.  LOW-SATURATED FAT / LOW-FAT FOOD SUBSTITUTES Meats / Saturated Fat (g)  Avoid: Steak, marbled (3 oz/85 g) / 11 g  Choose: Steak, lean (3 oz/85 g) / 4 g  Avoid: Hamburger (3 oz/85 g) / 7  g  Choose: Hamburger, lean (3 oz/85 g) / 5 g  Avoid: Ham (3 oz/85 g) / 6 g  Choose: Ham, lean cut (3 oz/85 g) / 2.4 g  Avoid: Chicken, with skin, dark meat (3 oz/85 g) / 4 g  Choose: Chicken, skin removed, dark meat (3 oz/85 g) / 2 g  Avoid: Chicken, with skin, light meat (3 oz/85 g) / 2.5 g  Choose: Chicken, skin removed, light meat (3 oz/85 g) / 1 g Dairy / Saturated Fat (g)  Avoid: Whole milk (1 cup) / 5 g  Choose: Low-fat milk, 2% (1 cup) / 3 g  Choose: Low-fat milk, 1% (1 cup) / 1.5 g  Choose: Skim milk (1 cup) / 0.3 g  Avoid: Hard cheese (1 oz/28 g) / 6 g  Choose: Skim milk cheese (1 oz/28 g) / 2 to 3 g  Avoid: Cottage cheese, 4% fat (1 cup) / 6.5 g  Choose: Low-fat cottage cheese, 1% fat (1 cup) / 1.5 g  Avoid: Ice cream (1 cup) / 9 g  Choose: Sherbet (1 cup) / 2.5 g  Choose: Nonfat frozen yogurt (1 cup) / 0.3 g  Choose: Frozen fruit bar / trace  Avoid: Whipped cream (1 tbs) / 3.5 g  Choose: Nondairy whipped topping (1 tbs) / 1 g Condiments / Saturated Fat (g)  Avoid: Mayonnaise (1 tbs) / 2 g  Choose: Low-fat mayonnaise (1 tbs) / 1 g  Avoid: Butter (1 tbs) / 7 g  Choose: Extra light margarine (1 tbs) / 1 g  Avoid: Coconut oil (1 tbs) / 11.8 g  Choose: Olive oil (1 tbs) / 1.8 g  Choose: Corn oil (1 tbs) / 1.7 g  Choose: Safflower oil (1 tbs) / 1.2 g  Choose: Sunflower oil (1 tbs) / 1.4 g  Choose: Soybean oil (1 tbs) / 2.4 g  Choose: Canola oil (1 tbs) / 1 g Document Released: 08/19/2005 Document Revised: 12/14/2012 Document Reviewed: 11/17/2013 Exit[employer] Patient Information 2015 [employer]. This information is not intended to replace advice given to you by your health care provider. Make sure you discuss any questions you have with your health care provider.  

## 2014-11-22 NOTE — Assessment & Plan Note (Signed)
Secondary to RAS BP stable at this time Continue Losartan, hydralazine, aldactone and diltiazem Will check CBC and CMET today

## 2014-11-23 ENCOUNTER — Other Ambulatory Visit: Payer: Self-pay

## 2014-11-23 LAB — LIPID PANEL
CHOL/HDL RATIO: 2
Cholesterol: 229 mg/dL — ABNORMAL HIGH (ref 0–200)
HDL: 125.3 mg/dL (ref >39.00)
LDL Cholesterol: 91 mg/dL (ref 0–99)
NONHDL: 103.7
Triglycerides: 63 mg/dL (ref 0.0–149.0)
VLDL: 12.6 mg/dL (ref 0.0–40.0)

## 2014-11-23 LAB — COMPREHENSIVE METABOLIC PANEL
ALT: 12 U/L (ref 0–35)
AST: 21 U/L (ref 0–37)
Albumin: 4.2 g/dL (ref 3.5–5.2)
Alkaline Phosphatase: 71 U/L (ref 39–117)
BUN: 23 mg/dL (ref 6–23)
CALCIUM: 9.3 mg/dL (ref 8.4–10.5)
CHLORIDE: 98 meq/L (ref 96–112)
CO2: 22 meq/L (ref 19–32)
Creatinine, Ser: 1.51 mg/dL — ABNORMAL HIGH (ref 0.40–1.20)
GFR: 35.68 mL/min — AB (ref >60.00)
Glucose, Bld: 95 mg/dL (ref 70–99)
Potassium: 4.7 mEq/L (ref 3.5–5.1)
Sodium: 129 mEq/L — ABNORMAL LOW (ref 135–145)
Total Bilirubin: 0.6 mg/dL (ref 0.2–1.2)
Total Protein: 6.9 g/dL (ref 6.0–8.3)

## 2014-11-23 LAB — CBC
HCT: 43.4 % (ref 36.0–46.0)
Hemoglobin: 14.5 g/dL (ref 12.0–15.0)
MCHC: 33.4 g/dL (ref 30.0–36.0)
MCV: 89.1 fl (ref 78.0–100.0)
PLATELETS: 334 10*3/uL (ref 150.0–400.0)
RBC: 4.88 Mil/uL (ref 3.87–5.11)
RDW: 18.7 % — ABNORMAL HIGH (ref 11.5–15.5)
WBC: 5.1 10*3/uL (ref 4.0–10.5)

## 2014-11-23 LAB — VITAMIN B12: VITAMIN B 12: 503 pg/mL (ref 211–911)

## 2014-11-23 MED ORDER — DILTIAZEM HCL ER COATED BEADS 240 MG PO CP24: 240.0000 mg | ORAL_CAPSULE | Freq: Every day | ORAL | Status: DC

## 2014-11-28 ENCOUNTER — Telehealth: Payer: Self-pay | Admitting: Internal Medicine

## 2014-11-28 ENCOUNTER — Ambulatory Visit (INDEPENDENT_AMBULATORY_CARE_PROVIDER_SITE_OTHER): Payer: Medicare Other | Admitting: Family Medicine

## 2014-11-28 ENCOUNTER — Encounter: Payer: Self-pay | Admitting: Family Medicine

## 2014-11-28 VITALS — BP 134/60 | HR 70 | Temp 97.4°F | Wt 92.5 lb

## 2014-11-28 DIAGNOSIS — K219 Gastro-esophageal reflux disease without esophagitis: Secondary | ICD-10-CM

## 2014-11-28 DIAGNOSIS — R110 Nausea: Secondary | ICD-10-CM | POA: Insufficient documentation

## 2014-11-28 NOTE — Patient Instructions (Signed)
Nice to meet you. Please restart protonix and zantac.  We will call you with your lab results.  Please stop by to see Rosaria Ferries on your way out.

## 2014-11-28 NOTE — Assessment & Plan Note (Signed)
New- previous symptom of GERD/PUD Advised to restart protonix 40 mg - once or twice daily depending on her symptoms and to restart zantac. Check labs today and refer to GI. The patient indicates understanding of these issues and agrees with the plan.  Orders Placed This Encounter  Procedures  . Lipase  . CBC with Differential/Platelet  . Comprehensive metabolic panel  . Ambulatory referral to Gastroenterology

## 2014-11-28 NOTE — Telephone Encounter (Signed)
emmi emailed °

## 2014-11-28 NOTE — Progress Notes (Signed)
Subjective:   Patient ID: Jennifer Simpson, female    DOB: 1940/11/10, 74 y.o.   MRN: [id number]  Jennifer Simpson is a pleasant 74 y.o. year old female pt of Jennifer Simpson, new to me, who presents to clinic today with Abdominal Pain  on 11/28/2014  HPI:  Just saw Jennifer Simpson last week for 6 month follow up of chronic medical conditions.  Note reviewed from 11/22/14.  Known history of GERD- at that time she stated that she was not taking protonix or zantac because eating fermented vegetables seemed to be controlling her symptoms.  About two days later, started to feel nauseated without abdominal pain.  This is how she felt last time she had "ulcers." Nausea seems to occur in the morning and zantac relieves it.  Has not been to a GI doctor in awhile.    No vomiting.  No blood in her stool.  No black stools.  She is on coumadin for left DVT.  Remote h/o cancer of esophagus- chemotherapy and radiation (2008)- followed by Dr. Ma Simpson. No new difficulty swallowing.  Has always had some difficulty since her cancer treatment.  Due to her cancer tx, cannot have endoscopies done.  Has had a poor appetite for awhile. Wt Readings from Last 3 Encounters:  11/28/14 92 lb 8 oz (41.958 kg)  11/22/14 93 lb (42.185 kg)  11/11/14 93 lb (42.185 kg)    Lab Results  Component Value Date   ALT 12 11/22/2014   AST 21 11/22/2014   ALKPHOS 71 11/22/2014   BILITOT 0.6 11/22/2014   Lab Results  Component Value Date   WBC 5.1 11/22/2014   HGB 14.5 11/22/2014   HCT 43.4 11/22/2014   MCV 89.1 11/22/2014   PLT 334.0 11/22/2014    Current Outpatient Prescriptions on File Prior to Visit  Medication Sig Dispense Refill  . acetaminophen (TYLENOL) 500 MG tablet Take 1,000 mg by mouth every 6 (six) hours as needed for pain.    Marland Kitchen albuterol-ipratropium (COMBIVENT) 18-103 MCG/ACT inhaler Inhale 2 puffs into the lungs every 6 (six) hours as needed.    . cyanocobalamin (,VITAMIN B-12,) 1000 MCG/ML injection Inject into  the muscle.    . diltiazem (CARDIZEM CD) 240 MG 24 hr capsule Take 1 capsule (240 mg total) by mouth daily. 30 capsule 3  . fluticasone (FLONASE) 50 MCG/ACT nasal spray Place 2 sprays into the nose daily.     . hydrALAZINE (APRESOLINE) 25 MG tablet Take 1 tablet (25 mg total) by mouth 3 (three) times daily. 90 tablet 3  . losartan (COZAAR) 100 MG tablet Take 1 tablet (100 mg total) by mouth daily. 90 tablet 3  . Ranitidine HCl (ZANTAC PO) Take by mouth daily.    Marland Kitchen spironolactone (ALDACTONE) 25 MG tablet Take 1 tablet (25 mg total) by mouth daily. 30 tablet 6  . warfarin (COUMADIN) 5 MG tablet as directed.   0   No current facility-administered medications on file prior to visit.    Allergies  Allergen Reactions  . Trimethobenzamide Other (See Comments)    Eye allergy, whites of eyes turned red, for about two weeks, there after.    . Ceftin [Cefuroxime Axetil] Other (See Comments)    Unknown   . Chlorthalidone     hyponatremia  . Hctz [Hydrochlorothiazide]     Hyponatremia   . Tequin [Gatifloxacin] Itching    Past Medical History  Diagnosis Date  . Asthma   . Peripheral vascular disease  a. h/o iliac stents 2010. b. RAS as below.  . History of skin ulcer   . Hypertension   . GERD (gastroesophageal reflux disease)   . Squamous cell carcinoma of oropharynx 2008     status post chemotherapy and radiation therapy in 2008  . Cancer of esophagus 2008    s/p chemo and radiation  . History of stomach ulcers   . Hyponatremia     Previously related to HCTZ/chlorthalidone  . Clotting disorder     left leg  . Chronic kidney disease     Past Surgical History  Procedure Laterality Date  . Peripheral arterial stent graft Bilateral     x2 STENTS BOTH LEGS  . Anterior cervical decomp/discectomy fusion  ~ 2006    "put in spacer" (12/02/2012)  . Portacath placement  2008  . Anterior cervical decomp/discectomy fusion  10/07/2012    Procedure: ANTERIOR CERVICAL  DECOMPRESSION/DISCECTOMY FUSION 2 LEVEL/HARDWARE REMOVAL;  Surgeon: Ophelia Charter, MD;  Location: [place] [facility];  Service: Neurosurgery;  Laterality: N/A;  Cervical three-four, cervical four-five Anterior cervical decompression/diskectomy/fusion/interbody plating and prosthesis/removal of spinal concept plate  . Cataract extraction w/ intraocular lens  implant, bilateral  ~2005  . Renal artery stent Right 12/02/2012    Successful angioplasty and bare-metal stent placement to the ostial right renal artery  . Tonsillectomy  ~ 1946  . Breast cyst excision Right ?1980's    negative  . Renal angiogram N/A 12/02/2012    Procedure: RENAL ANGIOGRAM;  Surgeon: Wellington Hampshire, MD;  Location[facility] CATH LAB;  Service: Cardiovascular;  Laterality: N/A;    Family History  Problem Relation Age of Onset  . Diabetes Sister   . Cancer Neg Hx   . Stroke Neg Hx     History   Social History  . Marital Status: Widowed    Spouse Name: N/A  . Number of Children: N/A  . Years of Education: N/A   Occupational History  . Not on file.   Social History Main Topics  . Smoking status: Current Some Day Smoker -- 0.25 packs/day for 50 years    Types: Cigarettes  . Smokeless tobacco: Never Used     Comment: 12/02/2012 offered smoking cessation materials; Electronic cigarette  . Alcohol Use: 3.0 oz/week    5 Glasses of wine per week     Comment: wine occasional  . Drug Use: No  . Sexual Activity: Not Currently   Other Topics Concern  . Not on file   Social History Narrative   The PMH, PSH, Social History, Family History, Medications, and allergies have been reviewed in [facility], and have been updated if relevant.    Review of Systems  Constitutional: Negative for fever and chills.  Gastrointestinal: Positive for nausea. Negative for vomiting, abdominal pain, diarrhea, constipation, blood in stool, abdominal distention, anal bleeding and rectal pain.  Skin: Negative.   Neurological: Negative for dizziness.    All other systems reviewed and are negative.      Objective:    BP 134/60 mmHg  Pulse 70  Temp(Src) 97.4 F (36.3 C) (Oral)  Wt 92 lb 8 oz (41.958 kg)  SpO2 96%   Physical Exam  Constitutional: She is oriented to person, place, and time. She appears well-nourished. No distress.  thin  HENT:  Head: Normocephalic.  Eyes: Conjunctivae are normal.  Abdominal: Soft. She exhibits no distension and no mass. There is no tenderness. There is no rebound and no guarding.  Musculoskeletal: She exhibits no edema.  Neurological: She is alert and oriented to person, place, and time.  Skin: Skin is warm and dry.  Psychiatric: She has a normal mood and affect. Her behavior is normal. Thought content normal.  Nursing note and vitals reviewed.         Assessment & Plan:   Gastroesophageal reflux disease, esophagitis presence not specified No Follow-up on file.

## 2014-11-28 NOTE — Progress Notes (Signed)
Pre visit review using our clinic review tool, if applicable. No additional management support is needed unless otherwise documented below in the visit note. 

## 2014-11-29 LAB — COMPREHENSIVE METABOLIC PANEL
ALBUMIN: 4 g/dL (ref 3.5–5.2)
ALT: 10 U/L (ref 0–35)
AST: 18 U/L (ref 0–37)
Alkaline Phosphatase: 82 U/L (ref 39–117)
BILIRUBIN TOTAL: 0.5 mg/dL (ref 0.2–1.2)
BUN: 16 mg/dL (ref 6–23)
CO2: 23 mEq/L (ref 19–32)
Calcium: 9.4 mg/dL (ref 8.4–10.5)
Chloride: 100 mEq/L (ref 96–112)
Creatinine, Ser: 1.24 mg/dL — ABNORMAL HIGH (ref 0.40–1.20)
GFR: 44.78 mL/min — ABNORMAL LOW (ref >60.00)
Glucose, Bld: 93 mg/dL (ref 70–99)
Potassium: 4.8 mEq/L (ref 3.5–5.1)
Sodium: 131 mEq/L — ABNORMAL LOW (ref 135–145)
TOTAL PROTEIN: 6.9 g/dL (ref 6.0–8.3)

## 2014-11-29 LAB — CBC WITH DIFFERENTIAL/PLATELET
Basophils Absolute: 0 10*3/uL (ref 0.0–0.1)
Basophils Relative: 0.4 % (ref 0.0–3.0)
EOS ABS: 0 10*3/uL (ref 0.0–0.7)
EOS PCT: 0.4 % (ref 0.0–5.0)
HCT: 44.9 % (ref 36.0–46.0)
HEMOGLOBIN: 15 g/dL (ref 12.0–15.0)
LYMPHS ABS: 0.8 10*3/uL (ref 0.7–4.0)
Lymphocytes Relative: 13.1 % (ref 12.0–46.0)
MCHC: 33.4 g/dL (ref 30.0–36.0)
MCV: 90.4 fl (ref 78.0–100.0)
Monocytes Absolute: 0.5 10*3/uL (ref 0.1–1.0)
Monocytes Relative: 7.4 % (ref 3.0–12.0)
Neutro Abs: 4.8 10*3/uL (ref 1.4–7.7)
Neutrophils Relative %: 78.7 % — ABNORMAL HIGH (ref 43.0–77.0)
Platelets: 324 10*3/uL (ref 150.0–400.0)
RBC: 4.97 Mil/uL (ref 3.87–5.11)
RDW: 20.4 % — AB (ref 11.5–15.5)
WBC: 6.1 10*3/uL (ref 4.0–10.5)

## 2014-11-29 LAB — LIPASE: LIPASE: 35 U/L (ref 11.0–59.0)

## 2014-12-02 ENCOUNTER — Ambulatory Visit
Admit: 2014-12-02 | Disposition: A | Discharge status: Home / Self Care | Payer: Self-pay | Attending: Internal Medicine | Admitting: Internal Medicine

## 2014-12-07 LAB — PROTIME-INR
INR: 14.2
Prothrombin Time: 103.4 secs — ABNORMAL HIGH

## 2014-12-08 ENCOUNTER — Telehealth: Payer: Self-pay | Admitting: Internal Medicine

## 2014-12-08 LAB — PROTIME-INR
INR: 1.4
PROTHROMBIN TIME: 17.2 s — AB

## 2014-12-08 LAB — HEMATOCRIT: HCT: 45.7 % (ref 35.0–47.0)

## 2014-12-08 NOTE — Telephone Encounter (Signed)
PT WOULD LIKE A CALL BACK SO THAT SHE CAN UPDATE SOMEONE ON HER CONDITION. [place] ME ANY INFO

## 2014-12-08 NOTE — Telephone Encounter (Signed)
Dr. Deborra Medina has referred her to GI

## 2014-12-08 NOTE — Telephone Encounter (Signed)
Spoke with pt and she said she was told by dr Deborra Medina to call and give an update on her Sx. Pt states her Sx from last OV with Dr Deborra Medina have improved but not gone away

## 2014-12-09 ENCOUNTER — Telehealth: Payer: Self-pay

## 2014-12-09 DIAGNOSIS — I10 Essential (primary) hypertension: Secondary | ICD-10-CM

## 2014-12-09 LAB — PROTIME-INR
INR: 1.2
Prothrombin Time: 15.6 secs — ABNORMAL HIGH

## 2014-12-09 MED ORDER — CYCLOBENZAPRINE HCL 5 MG PO TABS: 5.0000 mg | ORAL_TABLET | Freq: Two times a day (BID) | ORAL | Status: DC | PRN

## 2014-12-09 MED ORDER — HYDRALAZINE HCL 10 MG PO TABS: 10.0000 mg | ORAL_TABLET | Freq: Three times a day (TID) | ORAL | Status: DC

## 2014-12-09 NOTE — Telephone Encounter (Signed)
Pt left v/m requesting muscle relaxant to [place]; muscles in back and shoulders are tight and making it difficult to sleep at night; pt seen 11/22/14 for 6 mth f/u appt. Please advise.

## 2014-12-09 NOTE — Telephone Encounter (Signed)
Flexeril sent.  Sedation caution.  F/u if not better.  Thanks.

## 2014-12-09 NOTE — Telephone Encounter (Signed)
Patient notified

## 2014-12-09 NOTE — Telephone Encounter (Signed)
Pt of Dr. Fletcher Anon.  She is concerned that her BP is too low. Would like to know if any meds should be changed.

## 2014-12-09 NOTE — Telephone Encounter (Signed)
Spoke w/ pt. Advised her of Ryan's recommendation.  She is appreciative and will call Monday if her sx do not improve.

## 2014-12-09 NOTE — Telephone Encounter (Signed)
She has history of refractory blood pressure and her self reported readings seem to be quite labile. I have decreased her hydralazine from 25 mg tid to 10 mg tid. If she sees that her blood pressure starts to become elevated with this change she should let us or her PCP know.

## 2014-12-09 NOTE — Telephone Encounter (Signed)
Pt c/o BP issue: STAT if pt c/o blurred vision, one-sided weakness or slurred speech  1. What are your last 5 BP readings?                     Now, 99/43,                  3-22- 141/62,                 3-18- 172/55,                3-18- 142/59,                3-10 120/70  2. Are you having any other symptoms (ex. Dizziness, headache, blurred vision, passed out)? Lightheaded, "the lights seem real bright"  3. What is your BP issue? Too low

## 2014-12-19 ENCOUNTER — Telehealth: Payer: Self-pay

## 2014-12-19 NOTE — Telephone Encounter (Signed)
Pt left v/m;pt has ulcer and takes protonix and zantac which nauseates pt; pt has been taking promethazine HCL 25 mg. (pt got med prior to coming to [facility], not on med list). Pt request promethazine sent to [facility].

## 2014-12-20 ENCOUNTER — Other Ambulatory Visit: Payer: Self-pay | Admitting: Internal Medicine

## 2014-12-20 MED ORDER — PROMETHAZINE HCL 12.5 MG PO TABS: 12.5000 mg | ORAL_TABLET | Freq: Three times a day (TID) | ORAL | Status: DC | PRN

## 2014-12-21 LAB — PROTIME-INR
INR: 1
PROTHROMBIN TIME: 13.5 s

## 2014-12-27 LAB — PROTIME-INR
INR: 1.2
Prothrombin Time: 15.5 secs — ABNORMAL HIGH

## 2014-12-28 ENCOUNTER — Emergency Department: Admit: 2014-12-28 | Discharge status: Against Medical Advice | Payer: Self-pay | Admitting: Emergency Medicine

## 2015-01-03 ENCOUNTER — Other Ambulatory Visit: Payer: Self-pay

## 2015-01-03 DIAGNOSIS — D751 Secondary polycythemia: Secondary | ICD-10-CM

## 2015-01-04 ENCOUNTER — Other Ambulatory Visit: Payer: Self-pay | Admitting: *Deleted

## 2015-01-04 ENCOUNTER — Inpatient Hospital Stay: Payer: Medicare Other

## 2015-01-04 ENCOUNTER — Inpatient Hospital Stay: Payer: Medicare Other | Attending: Internal Medicine

## 2015-01-04 ENCOUNTER — Other Ambulatory Visit: Payer: Self-pay

## 2015-01-04 DIAGNOSIS — D751 Secondary polycythemia: Secondary | ICD-10-CM

## 2015-01-04 DIAGNOSIS — Z5181 Encounter for therapeutic drug level monitoring: Secondary | ICD-10-CM | POA: Insufficient documentation

## 2015-01-04 DIAGNOSIS — I82402 Acute embolism and thrombosis of unspecified deep veins of left lower extremity: Secondary | ICD-10-CM

## 2015-01-04 DIAGNOSIS — Z7901 Long term (current) use of anticoagulants: Secondary | ICD-10-CM | POA: Insufficient documentation

## 2015-01-04 DIAGNOSIS — Z86718 Personal history of other venous thrombosis and embolism: Secondary | ICD-10-CM | POA: Diagnosis present

## 2015-01-04 LAB — PROTIME-INR
INR: 4.01
Prothrombin Time: 39 seconds — ABNORMAL HIGH (ref 11.4–15.0)

## 2015-01-04 LAB — HEMATOCRIT: HCT: 43 % (ref 35.0–47.0)

## 2015-01-06 ENCOUNTER — Other Ambulatory Visit: Payer: Self-pay

## 2015-01-06 ENCOUNTER — Telehealth: Payer: Self-pay

## 2015-01-06 ENCOUNTER — Inpatient Hospital Stay: Payer: Medicare Other

## 2015-01-06 DIAGNOSIS — I82402 Acute embolism and thrombosis of unspecified deep veins of left lower extremity: Secondary | ICD-10-CM

## 2015-01-06 DIAGNOSIS — I739 Peripheral vascular disease, unspecified: Secondary | ICD-10-CM

## 2015-01-06 DIAGNOSIS — Z86718 Personal history of other venous thrombosis and embolism: Secondary | ICD-10-CM | POA: Diagnosis not present

## 2015-01-06 LAB — PROTIME-INR
INR: 3.57
Prothrombin Time: 35.7 seconds — ABNORMAL HIGH (ref 11.4–15.0)

## 2015-01-06 NOTE — Telephone Encounter (Signed)
-----   Message from Leia Alf, MD sent at 01/06/2015  2:15 PM EDT ----- INR is 3.57 today. Please advise patient to hold Coumadin today and tomorrow. Starting on 01/08/15, she needs to take Coumadin 2.5 mg by mouth daily. Keep scheduled appointments the same. Thanks.

## 2015-01-06 NOTE — Telephone Encounter (Signed)
  Called patient and informed her that her INR is 3.57 today. I advised her to hold Coumadin today and tomorrow and starting on 01/08/15, take Coumadin 2.5 mg by mouth daily and keep scheduled appointments the same.

## 2015-01-09 ENCOUNTER — Ambulatory Visit (INDEPENDENT_AMBULATORY_CARE_PROVIDER_SITE_OTHER): Payer: Medicare Other | Admitting: Podiatry

## 2015-01-09 VITALS — BP 123/74 | HR 81 | Resp 16

## 2015-01-09 DIAGNOSIS — L89891 Pressure ulcer of other site, stage 1: Secondary | ICD-10-CM | POA: Diagnosis not present

## 2015-01-09 DIAGNOSIS — R234 Changes in skin texture: Secondary | ICD-10-CM

## 2015-01-09 DIAGNOSIS — L97311 Non-pressure chronic ulcer of right ankle limited to breakdown of skin: Secondary | ICD-10-CM

## 2015-01-10 NOTE — Progress Notes (Signed)
She presents today with chief complaint of pain to the lateral aspect of her right ankle.  Objective: Vital signs are stable she is alert and oriented 3. Pulses are nonpalpable right lower extremity with blanching on elevation. He has a superficial ulceration overlying the fibular malleolus measuring approximately 3 mm in diameter with some fibrin deposition and superficial skin breakdown. I see very little in the way of skin infection at this point.  Assessment: It appears to be a arterial ulceration fibular malleolus right foot.  Plan: She will start placing a small amount of Bactroban ointment to the region daily and covering with a light dressing. We are also referring her to vascular for a lower extremity arterial workup.

## 2015-01-12 ENCOUNTER — Telehealth: Payer: Self-pay | Admitting: *Deleted

## 2015-01-12 NOTE — Telephone Encounter (Signed)
Pt is asking since [facility] won't call patient back or Dr Milinda Pointer is not returing pt call. She was wondering if Dr Fletcher Anon would just do what those two places planned on doing.   Please advise.

## 2015-01-12 NOTE — Telephone Encounter (Signed)
S/w patient who indicated she was referred to a [facility] from Dr. Sherren Mocha Hyatt's office. She has made and appt. But can not remember date/time. She has called the office back but no one has returned her call, therefore she called our office for further recommendation. I suggested to the patient that she give them time to call her back. She verbalized understanding with no further questions.

## 2015-01-15 ENCOUNTER — Other Ambulatory Visit: Payer: Self-pay | Admitting: *Deleted

## 2015-01-15 ENCOUNTER — Encounter: Payer: Self-pay | Admitting: *Deleted

## 2015-01-15 DIAGNOSIS — I824Z9 Acute embolism and thrombosis of unspecified deep veins of unspecified distal lower extremity: Secondary | ICD-10-CM

## 2015-01-15 DIAGNOSIS — D751 Secondary polycythemia: Secondary | ICD-10-CM

## 2015-01-15 DIAGNOSIS — I824Z2 Acute embolism and thrombosis of unspecified deep veins of left distal lower extremity: Secondary | ICD-10-CM

## 2015-01-15 DIAGNOSIS — C029 Malignant neoplasm of tongue, unspecified: Secondary | ICD-10-CM

## 2015-01-15 HISTORY — DX: Acute embolism and thrombosis of unspecified deep veins of unspecified distal lower extremity: I82.4Z9

## 2015-01-15 HISTORY — DX: Secondary polycythemia: D75.1

## 2015-01-18 ENCOUNTER — Inpatient Hospital Stay: Payer: Medicare Other

## 2015-01-18 DIAGNOSIS — Z86718 Personal history of other venous thrombosis and embolism: Secondary | ICD-10-CM | POA: Diagnosis not present

## 2015-01-18 DIAGNOSIS — D751 Secondary polycythemia: Secondary | ICD-10-CM

## 2015-01-18 DIAGNOSIS — I824Z2 Acute embolism and thrombosis of unspecified deep veins of left distal lower extremity: Secondary | ICD-10-CM

## 2015-01-18 LAB — HEMATOCRIT: HCT: 41.9 % (ref 35.0–47.0)

## 2015-01-18 LAB — PROTIME-INR
INR: 1.64
PROTHROMBIN TIME: 19.6 s — AB (ref 11.4–15.0)

## 2015-01-19 ENCOUNTER — Other Ambulatory Visit: Payer: Self-pay

## 2015-01-19 DIAGNOSIS — Z86718 Personal history of other venous thrombosis and embolism: Secondary | ICD-10-CM

## 2015-01-19 NOTE — Progress Notes (Signed)
Called patient and informed her that her INR is 1.6 today and she should continue on her same dose of Coumadin. Advised her that [name] would like to repeat INR on 01/24/15. I entered order for PT/INR on 01/24/15 and have sent scheduling message to schedulers.

## 2015-01-24 ENCOUNTER — Inpatient Hospital Stay: Payer: Medicare Other

## 2015-01-24 DIAGNOSIS — Z86718 Personal history of other venous thrombosis and embolism: Secondary | ICD-10-CM

## 2015-01-24 LAB — PROTIME-INR
INR: 2.18
PROTHROMBIN TIME: 24.4 s — AB (ref 11.4–15.0)

## 2015-01-31 ENCOUNTER — Encounter: Payer: Self-pay | Admitting: *Deleted

## 2015-01-31 ENCOUNTER — Ambulatory Visit
Admission: RE | Admit: 2015-01-31 | Discharge: 2015-01-31 | Disposition: A | Discharge status: Home / Self Care | Payer: Medicare Other | Source: Ambulatory Visit | Attending: Vascular Surgery | Admitting: Vascular Surgery

## 2015-01-31 ENCOUNTER — Encounter
Admission: RE | Disposition: A | Discharge status: Home / Self Care | Payer: Self-pay | Source: Ambulatory Visit | Attending: Vascular Surgery

## 2015-01-31 DIAGNOSIS — I70235 Atherosclerosis of native arteries of right leg with ulceration of other part of foot: Secondary | ICD-10-CM

## 2015-01-31 DIAGNOSIS — I70203 Unspecified atherosclerosis of native arteries of extremities, bilateral legs: Secondary | ICD-10-CM | POA: Insufficient documentation

## 2015-01-31 DIAGNOSIS — Z79899 Other long term (current) drug therapy: Secondary | ICD-10-CM | POA: Insufficient documentation

## 2015-01-31 DIAGNOSIS — L97519 Non-pressure chronic ulcer of other part of right foot with unspecified severity: Secondary | ICD-10-CM | POA: Insufficient documentation

## 2015-01-31 DIAGNOSIS — Z86718 Personal history of other venous thrombosis and embolism: Secondary | ICD-10-CM | POA: Insufficient documentation

## 2015-01-31 DIAGNOSIS — I10 Essential (primary) hypertension: Secondary | ICD-10-CM | POA: Insufficient documentation

## 2015-01-31 DIAGNOSIS — Z7901 Long term (current) use of anticoagulants: Secondary | ICD-10-CM

## 2015-01-31 HISTORY — PX: PERIPHERAL VASCULAR CATHETERIZATION: SHX172C

## 2015-01-31 LAB — CREATININE, SERUM
Creatinine, Ser: 1.38 mg/dL — ABNORMAL HIGH (ref 0.44–1.00)
GFR, EST AFRICAN AMERICAN: 42 mL/min — AB (ref >60)
GFR, EST NON AFRICAN AMERICAN: 36 mL/min — AB (ref >60)

## 2015-01-31 LAB — BUN: BUN: 24 mg/dL — ABNORMAL HIGH (ref 6–20)

## 2015-01-31 LAB — PROTIME-INR
INR: 1.15
PROTHROMBIN TIME: 14.9 s (ref 11.4–15.0)

## 2015-01-31 SURGERY — LOWER EXTREMITY ANGIOGRAPHY
Anesthesia: Moderate Sedation | Laterality: Right

## 2015-01-31 MED ORDER — LIDOCAINE HCL (PF) 1 % IJ SOLN
INTRAMUSCULAR | Status: DC | PRN
  Administered 2015-01-31: 10 mL via INTRADERMAL

## 2015-01-31 MED ORDER — SODIUM CHLORIDE 0.9 % IV SOLN
500.0000 mL | Freq: Once | INTRAVENOUS | Status: DC | PRN
Start: 2015-01-31 — End: 2015-01-31

## 2015-01-31 MED ORDER — CLINDAMYCIN PHOSPHATE 300 MG/50ML IV SOLN
300.0000 mg | Freq: Once | INTRAVENOUS | Status: AC
  Administered 2015-01-31: 300 mg via INTRAVENOUS

## 2015-01-31 MED ORDER — HEPARIN (PORCINE) IN NACL 2-0.9 UNIT/ML-% IJ SOLN
INTRAMUSCULAR | Status: AC
  Filled 2015-01-31: qty 1000

## 2015-01-31 MED ORDER — LIDOCAINE HCL (PF) 1 % IJ SOLN
INTRAMUSCULAR | Status: AC
  Filled 2015-01-31: qty 10

## 2015-01-31 MED ORDER — DEXTROSE 50 % IV SOLN: 25.0000 mL | INTRAVENOUS | Status: DC | PRN

## 2015-01-31 MED ORDER — CLINDAMYCIN PHOSPHATE 300 MG/50ML IV SOLN
INTRAVENOUS | Status: AC
  Filled 2015-01-31: qty 50

## 2015-01-31 MED ORDER — IOHEXOL 300 MG/ML  SOLN
INTRAMUSCULAR | Status: DC | PRN
  Administered 2015-01-31: 55 mL via INTRA_ARTERIAL

## 2015-01-31 MED ORDER — CEFAZOLIN SODIUM 1-5 GM-% IV SOLN
INTRAVENOUS | Status: AC
  Filled 2015-01-31: qty 50

## 2015-01-31 MED ORDER — ONDANSETRON HCL 4 MG/2ML IJ SOLN: 4.0000 mg | INTRAMUSCULAR | Status: DC | PRN

## 2015-01-31 MED ORDER — HYDROMORPHONE HCL 1 MG/ML IJ SOLN: 1.0000 mg | Freq: Once | INTRAMUSCULAR | Status: DC | PRN

## 2015-01-31 MED ORDER — FENTANYL CITRATE (PF) 100 MCG/2ML IJ SOLN
INTRAMUSCULAR | Status: AC
  Filled 2015-01-31: qty 2

## 2015-01-31 MED ORDER — ATROPINE SULFATE 0.4 MG/ML IJ SOLN
0.5000 mg | INTRAMUSCULAR | Status: DC | PRN
  Filled 2015-01-31: qty 1.25

## 2015-01-31 MED ORDER — SODIUM CHLORIDE 0.9 % IV SOLN
INTRAVENOUS | Status: DC
  Administered 2015-01-31: 10:00:00 via INTRAVENOUS

## 2015-01-31 MED ORDER — SODIUM BICARBONATE 8.4 % IV SOLN
INTRAVENOUS | Status: DC
  Filled 2015-01-31: qty 500

## 2015-01-31 MED ORDER — MIDAZOLAM HCL 2 MG/2ML IJ SOLN
INTRAMUSCULAR | Status: DC | PRN
  Administered 2015-01-31 (×2): 1 mg via INTRAVENOUS
  Administered 2015-01-31: 2 mg via INTRAVENOUS

## 2015-01-31 MED ORDER — MIDAZOLAM HCL 5 MG/5ML IJ SOLN
INTRAMUSCULAR | Status: AC
  Filled 2015-01-31: qty 5

## 2015-01-31 MED ORDER — HEPARIN SODIUM (PORCINE) 1000 UNIT/ML IJ SOLN
INTRAMUSCULAR | Status: AC
  Filled 2015-01-31: qty 1

## 2015-01-31 MED ORDER — FENTANYL CITRATE (PF) 100 MCG/2ML IJ SOLN
INTRAMUSCULAR | Status: DC | PRN
  Administered 2015-01-31 (×3): 50 ug via INTRAVENOUS

## 2015-01-31 MED ORDER — SODIUM BICARBONATE BOLUS VIA INFUSION
INTRAVENOUS | Status: AC
  Administered 2015-01-31: 11:00:00 via INTRAVENOUS
  Filled 2015-01-31: qty 1

## 2015-01-31 MED ORDER — HEPARIN SODIUM (PORCINE) 1000 UNIT/ML IJ SOLN
INTRAMUSCULAR | Status: DC | PRN
  Administered 2015-01-31: 4000 [IU] via INTRAVENOUS

## 2015-01-31 SURGICAL SUPPLY — 17 items
CATH KUMPE (CATHETERS) ×2
CATH RIM 5FR (CATHETERS) ×4 IMPLANT
CATH ROYAL FLUSH PIG 5F 70CM (CATHETERS) ×4 IMPLANT
CATH SLIP 5FR .038X65 KMP (CATHETERS) ×2
CATH SLIP 5FR 0.38 X 40 KMP (CATHETERS) ×2 IMPLANT
DEVICE PRESTO INFLATION (MISCELLANEOUS) ×4 IMPLANT
DEVICE STARCLOSE SE CLOSURE (Vascular Products) ×2 IMPLANT
GLIDEWIRE ADV .035X260CM (WIRE) ×4 IMPLANT
PACK ANGIOGRAPHY (CUSTOM PROCEDURE TRAY) ×4 IMPLANT
SET INTRO CAPELLA COAXIAL (SET/KITS/TRAYS/PACK) ×4 IMPLANT
SHEATH BALKIN 6FR (SHEATH) ×4 IMPLANT
SHEATH BRITE TIP 5FRX11 (SHEATH) ×4 IMPLANT
SHEATH BRITE TIP 6FRX11 (SHEATH) ×4 IMPLANT
SYR MEDRAD MARK V 150ML (SYRINGE) ×4 IMPLANT
TUBING CONTRAST HIGH PRESS 72 (TUBING) ×4 IMPLANT
WIRE J 3MM .035X145CM (WIRE) ×4 IMPLANT
WIRE MAGIC TOR.035 180C (WIRE) ×4 IMPLANT

## 2015-01-31 NOTE — OR Nursing (Signed)
Call pharmacy to check on Sodium Bicarbonate drip, stated sent....but to wrong department. Called for

## 2015-01-31 NOTE — Discharge Instructions (Signed)

## 2015-01-31 NOTE — H&P (Signed)
[facility] History & Physical Update  The patient was interviewed and re-examined.  The patient's previous History and Physical has been reviewed and is unchanged.  There is no change in the plan of care.  Schnier, Dolores Lory, MD  01/31/2015, 1:31 PM

## 2015-02-01 ENCOUNTER — Encounter: Payer: Self-pay | Admitting: Vascular Surgery

## 2015-02-01 ENCOUNTER — Other Ambulatory Visit: Payer: Medicare Other

## 2015-02-01 ENCOUNTER — Inpatient Hospital Stay: Payer: Medicare Other

## 2015-02-01 ENCOUNTER — Inpatient Hospital Stay: Payer: Medicare Other | Admitting: Anesthesiology

## 2015-02-01 ENCOUNTER — Encounter
Admission: AD | Disposition: A | Discharge status: Hospice | Payer: Self-pay | Source: Ambulatory Visit | Attending: Vascular Surgery

## 2015-02-01 ENCOUNTER — Inpatient Hospital Stay
Admission: AD | Admit: 2015-02-01 | Discharge: 2015-02-10 | DRG: 270 | Disposition: A | Discharge status: Hospice | Payer: Medicare Other | Source: Ambulatory Visit | Attending: Vascular Surgery | Admitting: Vascular Surgery

## 2015-02-01 DIAGNOSIS — I70223 Atherosclerosis of native arteries of extremities with rest pain, bilateral legs: Secondary | ICD-10-CM | POA: Diagnosis present

## 2015-02-01 DIAGNOSIS — Z7901 Long term (current) use of anticoagulants: Secondary | ICD-10-CM | POA: Diagnosis not present

## 2015-02-01 DIAGNOSIS — Z515 Encounter for palliative care: Secondary | ICD-10-CM

## 2015-02-01 DIAGNOSIS — I998 Other disorder of circulatory system: Secondary | ICD-10-CM | POA: Diagnosis not present

## 2015-02-01 DIAGNOSIS — E872 Acidosis: Secondary | ICD-10-CM | POA: Diagnosis not present

## 2015-02-01 DIAGNOSIS — J95821 Acute postprocedural respiratory failure: Secondary | ICD-10-CM | POA: Diagnosis not present

## 2015-02-01 DIAGNOSIS — N179 Acute kidney failure, unspecified: Secondary | ICD-10-CM | POA: Diagnosis not present

## 2015-02-01 DIAGNOSIS — Z419 Encounter for procedure for purposes other than remedying health state, unspecified: Secondary | ICD-10-CM

## 2015-02-01 DIAGNOSIS — I745 Embolism and thrombosis of iliac artery: Principal | ICD-10-CM | POA: Diagnosis present

## 2015-02-01 DIAGNOSIS — I701 Atherosclerosis of renal artery: Secondary | ICD-10-CM | POA: Diagnosis present

## 2015-02-01 DIAGNOSIS — Z833 Family history of diabetes mellitus: Secondary | ICD-10-CM | POA: Diagnosis not present

## 2015-02-01 DIAGNOSIS — Z4659 Encounter for fitting and adjustment of other gastrointestinal appliance and device: Secondary | ICD-10-CM

## 2015-02-01 DIAGNOSIS — F10931 Alcohol use, unspecified with withdrawal delirium: Secondary | ICD-10-CM

## 2015-02-01 DIAGNOSIS — I129 Hypertensive chronic kidney disease with stage 1 through stage 4 chronic kidney disease, or unspecified chronic kidney disease: Secondary | ICD-10-CM | POA: Diagnosis present

## 2015-02-01 DIAGNOSIS — I2510 Atherosclerotic heart disease of native coronary artery without angina pectoris: Secondary | ICD-10-CM | POA: Diagnosis present

## 2015-02-01 DIAGNOSIS — Z9889 Other specified postprocedural states: Secondary | ICD-10-CM | POA: Diagnosis not present

## 2015-02-01 DIAGNOSIS — Z8711 Personal history of peptic ulcer disease: Secondary | ICD-10-CM

## 2015-02-01 DIAGNOSIS — F10231 Alcohol dependence with withdrawal delirium: Secondary | ICD-10-CM | POA: Diagnosis not present

## 2015-02-01 DIAGNOSIS — T82858A Stenosis of vascular prosthetic devices, implants and grafts, initial encounter: Secondary | ICD-10-CM | POA: Diagnosis present

## 2015-02-01 DIAGNOSIS — Z452 Encounter for adjustment and management of vascular access device: Secondary | ICD-10-CM

## 2015-02-01 DIAGNOSIS — I739 Peripheral vascular disease, unspecified: Secondary | ICD-10-CM | POA: Diagnosis present

## 2015-02-01 DIAGNOSIS — Z85818 Personal history of malignant neoplasm of other sites of lip, oral cavity, and pharynx: Secondary | ICD-10-CM

## 2015-02-01 DIAGNOSIS — J45909 Unspecified asthma, uncomplicated: Secondary | ICD-10-CM | POA: Diagnosis present

## 2015-02-01 DIAGNOSIS — J9600 Acute respiratory failure, unspecified whether with hypoxia or hypercapnia: Secondary | ICD-10-CM | POA: Diagnosis not present

## 2015-02-01 DIAGNOSIS — Z86718 Personal history of other venous thrombosis and embolism: Secondary | ICD-10-CM

## 2015-02-01 DIAGNOSIS — K219 Gastro-esophageal reflux disease without esophagitis: Secondary | ICD-10-CM | POA: Diagnosis present

## 2015-02-01 DIAGNOSIS — L8990 Pressure ulcer of unspecified site, unspecified stage: Secondary | ICD-10-CM | POA: Insufficient documentation

## 2015-02-01 DIAGNOSIS — Z978 Presence of other specified devices: Secondary | ICD-10-CM

## 2015-02-01 DIAGNOSIS — I708 Atherosclerosis of other arteries: Secondary | ICD-10-CM | POA: Diagnosis present

## 2015-02-01 DIAGNOSIS — L97319 Non-pressure chronic ulcer of right ankle with unspecified severity: Secondary | ICD-10-CM | POA: Diagnosis present

## 2015-02-01 DIAGNOSIS — Z8501 Personal history of malignant neoplasm of esophagus: Secondary | ICD-10-CM

## 2015-02-01 DIAGNOSIS — J9588 Other intraoperative complications of respiratory system, not elsewhere classified: Secondary | ICD-10-CM | POA: Diagnosis not present

## 2015-02-01 DIAGNOSIS — Z9221 Personal history of antineoplastic chemotherapy: Secondary | ICD-10-CM | POA: Diagnosis not present

## 2015-02-01 DIAGNOSIS — Z66 Do not resuscitate: Secondary | ICD-10-CM | POA: Diagnosis present

## 2015-02-01 DIAGNOSIS — N189 Chronic kidney disease, unspecified: Secondary | ICD-10-CM | POA: Diagnosis present

## 2015-02-01 DIAGNOSIS — J690 Pneumonitis due to inhalation of food and vomit: Secondary | ICD-10-CM | POA: Diagnosis not present

## 2015-02-01 DIAGNOSIS — Z923 Personal history of irradiation: Secondary | ICD-10-CM

## 2015-02-01 DIAGNOSIS — J189 Pneumonia, unspecified organism: Secondary | ICD-10-CM | POA: Diagnosis not present

## 2015-02-01 DIAGNOSIS — J441 Chronic obstructive pulmonary disease with (acute) exacerbation: Secondary | ICD-10-CM | POA: Diagnosis present

## 2015-02-01 DIAGNOSIS — F17210 Nicotine dependence, cigarettes, uncomplicated: Secondary | ICD-10-CM | POA: Diagnosis present

## 2015-02-01 HISTORY — DX: Systemic involvement of connective tissue, unspecified: M35.9

## 2015-02-01 HISTORY — PX: ENDARTERECTOMY FEMORAL: SHX5804

## 2015-02-01 HISTORY — PX: INSERTION OF ILIAC STENT: SHX6256

## 2015-02-01 LAB — BASIC METABOLIC PANEL
ANION GAP: 11 (ref 5–15)
BUN: 20 mg/dL (ref 6–20)
CHLORIDE: 98 mmol/L — AB (ref 101–111)
CO2: 18 mmol/L — ABNORMAL LOW (ref 22–32)
Calcium: 9 mg/dL (ref 8.9–10.3)
Creatinine, Ser: 1.29 mg/dL — ABNORMAL HIGH (ref 0.44–1.00)
GFR calc Af Amer: 46 mL/min — ABNORMAL LOW (ref >60)
GFR calc non Af Amer: 39 mL/min — ABNORMAL LOW (ref >60)
GLUCOSE: 153 mg/dL — AB (ref 65–99)
POTASSIUM: 4.8 mmol/L (ref 3.5–5.1)
Sodium: 127 mmol/L — ABNORMAL LOW (ref 135–145)

## 2015-02-01 LAB — CBC
HCT: 39.3 % (ref 35.0–47.0)
HEMOGLOBIN: 13.1 g/dL (ref 12.0–16.0)
MCH: 33.4 pg (ref 26.0–34.0)
MCHC: 33.3 g/dL (ref 32.0–36.0)
MCV: 100.1 fL — ABNORMAL HIGH (ref 80.0–100.0)
PLATELETS: 287 10*3/uL (ref 150–440)
RBC: 3.93 MIL/uL (ref 3.80–5.20)
RDW: 21.1 % — AB (ref 11.5–14.5)
WBC: 9 10*3/uL (ref 3.6–11.0)

## 2015-02-01 LAB — ABO/RH: ABO/RH(D): A NEG

## 2015-02-01 LAB — PROTIME-INR
INR: 1.37
Prothrombin Time: 17.1 seconds — ABNORMAL HIGH (ref 11.4–15.0)

## 2015-02-01 LAB — MAGNESIUM: Magnesium: 1 mg/dL — ABNORMAL LOW (ref 1.7–2.4)

## 2015-02-01 LAB — APTT: APTT: 33 s (ref 24–36)

## 2015-02-01 LAB — PHOSPHORUS: Phosphorus: 4 mg/dL (ref 2.5–4.6)

## 2015-02-01 LAB — PREPARE RBC (CROSSMATCH)

## 2015-02-01 SURGERY — ENDARTERECTOMY, FEMORAL
Anesthesia: General

## 2015-02-01 MED ORDER — IPRATROPIUM-ALBUTEROL 0.5-2.5 (3) MG/3ML IN SOLN: 3.0000 mL | Freq: Once | RESPIRATORY_TRACT | Status: DC

## 2015-02-01 MED ORDER — HYDRALAZINE HCL 20 MG/ML IJ SOLN: 5.0000 mg | INTRAMUSCULAR | Status: DC | PRN

## 2015-02-01 MED ORDER — OXYCODONE HCL 5 MG PO TABS: 5.0000 mg | ORAL_TABLET | ORAL | Status: DC | PRN

## 2015-02-01 MED ORDER — IPRATROPIUM-ALBUTEROL 0.5-2.5 (3) MG/3ML IN SOLN
3.0000 mL | Freq: Once | RESPIRATORY_TRACT | Status: AC
  Administered 2015-02-01: 3 mL via RESPIRATORY_TRACT

## 2015-02-01 MED ORDER — DOCUSATE SODIUM 100 MG PO CAPS
100.0000 mg | ORAL_CAPSULE | Freq: Every day | ORAL | Status: DC
  Administered 2015-02-02: 100 mg via ORAL
  Filled 2015-02-01: qty 1

## 2015-02-01 MED ORDER — ALUM & MAG HYDROXIDE-SIMETH 200-200-20 MG/5ML PO SUSP: 15.0000 mL | ORAL | Status: DC | PRN

## 2015-02-01 MED ORDER — NEOSTIGMINE METHYLSULFATE 10 MG/10ML IV SOLN
INTRAVENOUS | Status: DC | PRN
  Administered 2015-02-01: 3 mg via INTRAVENOUS

## 2015-02-01 MED ORDER — LOSARTAN POTASSIUM 50 MG PO TABS
100.0000 mg | ORAL_TABLET | Freq: Every day | ORAL | Status: DC
  Administered 2015-02-02: 100 mg via ORAL
  Filled 2015-02-01: qty 2

## 2015-02-01 MED ORDER — SODIUM CHLORIDE 0.9 % IV SOLN: INTRAVENOUS | Status: DC | PRN

## 2015-02-01 MED ORDER — ACETAMINOPHEN 325 MG PO TABS: 650.0000 mg | ORAL_TABLET | Freq: Four times a day (QID) | ORAL | Status: DC | PRN

## 2015-02-01 MED ORDER — DILTIAZEM HCL ER COATED BEADS 240 MG PO CP24
240.0000 mg | ORAL_CAPSULE | Freq: Every day | ORAL | Status: DC
  Administered 2015-02-02: 240 mg via ORAL
  Filled 2015-02-01: qty 1

## 2015-02-01 MED ORDER — SODIUM CHLORIDE 0.9 % IJ SOLN
INTRAMUSCULAR | Status: AC
  Filled 2015-02-01: qty 50

## 2015-02-01 MED ORDER — MAGNESIUM SULFATE 2 GM/50ML IV SOLN: 2.0000 g | Freq: Every day | INTRAVENOUS | Status: DC | PRN

## 2015-02-01 MED ORDER — GLYCOPYRROLATE 0.2 MG/ML IJ SOLN
INTRAMUSCULAR | Status: DC | PRN
  Administered 2015-02-01: 0.4 mg via INTRAVENOUS

## 2015-02-01 MED ORDER — ACETAMINOPHEN 325 MG PO TABS
325.0000 mg | ORAL_TABLET | ORAL | Status: DC | PRN
  Administered 2015-02-03: 650 mg via ORAL
  Filled 2015-02-01: qty 2

## 2015-02-01 MED ORDER — SODIUM CHLORIDE 0.9 % IV SOLN
INTRAVENOUS | Status: DC
  Administered 2015-02-02 – 2015-02-04 (×4): via INTRAVENOUS
  Administered 2015-02-04: 100 mL/h via INTRAVENOUS

## 2015-02-01 MED ORDER — FLEET ENEMA 7-19 GM/118ML RE ENEM: 1.0000 | ENEMA | Freq: Once | RECTAL | Status: AC | PRN

## 2015-02-01 MED ORDER — ONDANSETRON HCL 4 MG/2ML IJ SOLN: 4.0000 mg | Freq: Once | INTRAMUSCULAR | Status: DC | PRN

## 2015-02-01 MED ORDER — IPRATROPIUM-ALBUTEROL 0.5-2.5 (3) MG/3ML IN SOLN
RESPIRATORY_TRACT | Status: AC
  Filled 2015-02-01: qty 3

## 2015-02-01 MED ORDER — PHENOL 1.4 % MT LIQD: 1.0000 | OROMUCOSAL | Status: DC | PRN

## 2015-02-01 MED ORDER — POTASSIUM CHLORIDE CRYS ER 20 MEQ PO TBCR: 20.0000 meq | EXTENDED_RELEASE_TABLET | Freq: Every day | ORAL | Status: DC | PRN

## 2015-02-01 MED ORDER — IPRATROPIUM-ALBUTEROL 0.5-2.5 (3) MG/3ML IN SOLN
3.0000 mL | Freq: Four times a day (QID) | RESPIRATORY_TRACT | Status: DC | PRN
  Administered 2015-02-03 – 2015-02-07 (×2): 3 mL via RESPIRATORY_TRACT
  Filled 2015-02-01 (×2): qty 3

## 2015-02-01 MED ORDER — FUROSEMIDE 10 MG/ML IJ SOLN: 20.0000 mg | Freq: Once | INTRAMUSCULAR | Status: DC

## 2015-02-01 MED ORDER — HEPARIN SODIUM (PORCINE) 1000 UNIT/ML IJ SOLN
INTRAMUSCULAR | Status: DC | PRN
  Administered 2015-02-01: 200 mL via INTRAMUSCULAR

## 2015-02-01 MED ORDER — ROCURONIUM BROMIDE 100 MG/10ML IV SOLN
INTRAVENOUS | Status: DC | PRN
Start: 2015-02-01 — End: 2015-02-08
  Administered 2015-02-01: 20 mg via INTRAVENOUS
  Administered 2015-02-01: 30 mg via INTRAVENOUS
  Administered 2015-02-02: 50 mg via INTRAVENOUS

## 2015-02-01 MED ORDER — SODIUM CHLORIDE 0.9 % IV SOLN
INTRAVENOUS | Status: DC
  Administered 2015-02-01: 18:00:00 via INTRAVENOUS

## 2015-02-01 MED ORDER — ACETAMINOPHEN 650 MG RE SUPP: 650.0000 mg | Freq: Four times a day (QID) | RECTAL | Status: DC | PRN

## 2015-02-01 MED ORDER — HEPARIN SODIUM (PORCINE) 1000 UNIT/ML IJ SOLN
INTRAMUSCULAR | Status: DC | PRN
Start: 2015-02-01 — End: 2015-02-08
  Administered 2015-02-01: 4000 [IU] via INTRAVENOUS
  Administered 2015-02-02: 2 mL via INTRAVENOUS
  Administered 2015-02-02: 4 mL via INTRAVENOUS
  Administered 2015-02-02: 2 mL via INTRAVENOUS

## 2015-02-01 MED ORDER — FENTANYL CITRATE (PF) 100 MCG/2ML IJ SOLN
INTRAMUSCULAR | Status: DC | PRN
  Administered 2015-02-01 – 2015-02-02 (×5): 50 ug via INTRAVENOUS

## 2015-02-01 MED ORDER — IPRATROPIUM-ALBUTEROL 0.5-2.5 (3) MG/3ML IN SOLN
3.0000 mL | RESPIRATORY_TRACT | Status: DC
  Administered 2015-02-02 (×4): 3 mL via RESPIRATORY_TRACT
  Filled 2015-02-01 (×5): qty 3

## 2015-02-01 MED ORDER — HYDRALAZINE HCL 10 MG PO TABS
10.0000 mg | ORAL_TABLET | Freq: Three times a day (TID) | ORAL | Status: DC
  Filled 2015-02-01 (×3): qty 1

## 2015-02-01 MED ORDER — PANTOPRAZOLE SODIUM 40 MG PO TBEC
40.0000 mg | DELAYED_RELEASE_TABLET | Freq: Every day | ORAL | Status: DC
  Administered 2015-02-02: 40 mg via ORAL
  Filled 2015-02-01: qty 1

## 2015-02-01 MED ORDER — MORPHINE SULFATE 2 MG/ML IJ SOLN
2.0000 mg | INTRAMUSCULAR | Status: DC | PRN
  Administered 2015-02-01 – 2015-02-02 (×3): 2 mg via INTRAVENOUS
  Filled 2015-02-01 (×3): qty 1

## 2015-02-01 MED ORDER — BISACODYL 10 MG RE SUPP: 10.0000 mg | Freq: Every day | RECTAL | Status: DC | PRN

## 2015-02-01 MED ORDER — FENTANYL CITRATE (PF) 100 MCG/2ML IJ SOLN: 25.0000 ug | INTRAMUSCULAR | Status: DC | PRN

## 2015-02-01 MED ORDER — METOPROLOL TARTRATE 1 MG/ML IV SOLN: 2.0000 mg | INTRAVENOUS | Status: DC | PRN

## 2015-02-01 MED ORDER — ACETAMINOPHEN 650 MG RE SUPP: 325.0000 mg | RECTAL | Status: DC | PRN

## 2015-02-01 MED ORDER — HEPARIN SODIUM (PORCINE) 5000 UNIT/ML IJ SOLN
INTRAMUSCULAR | Status: AC
  Filled 2015-02-01: qty 1

## 2015-02-01 MED ORDER — VANCOMYCIN HCL 1000 MG IV SOLR
1000.0000 mg | INTRAVENOUS | Status: DC | PRN
  Administered 2015-02-01: 1000 mg via INTRAVENOUS

## 2015-02-01 MED ORDER — SPIRONOLACTONE 25 MG PO TABS
25.0000 mg | ORAL_TABLET | Freq: Every day | ORAL | Status: DC
  Administered 2015-02-02: 25 mg via ORAL
  Filled 2015-02-01: qty 1

## 2015-02-01 MED ORDER — VANCOMYCIN HCL IN DEXTROSE 1-5 GM/200ML-% IV SOLN
1000.0000 mg | INTRAVENOUS | Status: DC
  Filled 2015-02-01 (×2): qty 200

## 2015-02-01 MED ORDER — GUAIFENESIN-DM 100-10 MG/5ML PO SYRP: 15.0000 mL | ORAL_SOLUTION | ORAL | Status: DC | PRN

## 2015-02-01 MED ORDER — HEPARIN (PORCINE) IN NACL 100-0.45 UNIT/ML-% IJ SOLN: 15.0000 [IU]/kg/h | INTRAMUSCULAR | Status: DC

## 2015-02-01 MED ORDER — HYDROMORPHONE HCL 1 MG/ML IJ SOLN: 1.0000 mg | Freq: Four times a day (QID) | INTRAMUSCULAR | Status: DC | PRN

## 2015-02-01 MED ORDER — HEPARIN (PORCINE) IN NACL 100-0.45 UNIT/ML-% IJ SOLN
400.0000 [IU]/h | INTRAMUSCULAR | Status: DC
  Administered 2015-02-02: 400 [IU]/h via INTRAVENOUS
  Filled 2015-02-01: qty 250

## 2015-02-01 MED ORDER — MORPHINE SULFATE 4 MG/ML IJ SOLN
4.0000 mg | INTRAMUSCULAR | Status: DC | PRN
  Administered 2015-02-02: 4 mg via INTRAVENOUS
  Filled 2015-02-01: qty 1

## 2015-02-01 MED ORDER — LABETALOL HCL 5 MG/ML IV SOLN
10.0000 mg | INTRAVENOUS | Status: DC | PRN
  Administered 2015-02-03: 10 mg via INTRAVENOUS
  Filled 2015-02-01: qty 4

## 2015-02-01 MED ORDER — POLYETHYLENE GLYCOL 3350 17 G PO PACK: 17.0000 g | PACK | Freq: Every day | ORAL | Status: DC | PRN

## 2015-02-01 MED ORDER — ONDANSETRON HCL 4 MG/2ML IJ SOLN
4.0000 mg | Freq: Four times a day (QID) | INTRAMUSCULAR | Status: DC | PRN
  Administered 2015-02-02: 4 mg via INTRAVENOUS

## 2015-02-01 MED ORDER — SODIUM CHLORIDE 0.9 % IV SOLN
INTRAVENOUS | Status: DC | PRN
  Administered 2015-02-01 – 2015-02-02 (×3): via INTRAVENOUS

## 2015-02-01 SURGICAL SUPPLY — 80 items
APPLIER CLIP 11 MED OPEN (CLIP)
APPLIER CLIP 9.375 SM OPEN (CLIP)
APR CLP MED 11 20 MLT OPN (CLIP)
APR CLP SM 9.3 20 MLT OPN (CLIP)
BAG COUNTER SPONGE EZ (MISCELLANEOUS) ×3 IMPLANT
BAG DECANTER STRL (MISCELLANEOUS) ×4 IMPLANT
BAG ISL LRG 20X20 DRWSTRG (DRAPES) ×2
BAG ISOLATATION DRAPE 20X20 ST (DRAPES) IMPLANT
BAG SPNG 4X4 CLR HAZ (MISCELLANEOUS) ×2
BALLN DORADO 6X40X80 (BALLOONS) ×4
BALLOON DORADO 6X40X80 (BALLOONS) IMPLANT
BLADE SURG 15 STRL LF DISP TIS (BLADE) ×2 IMPLANT
BLADE SURG 15 STRL SS (BLADE) ×4
BLADE SURG SZ11 CARB STEEL (BLADE) ×4 IMPLANT
BOOT SUTURE AID YELLOW STND (SUTURE) ×8 IMPLANT
BRUSH SCRUB 4% CHG (MISCELLANEOUS) ×4 IMPLANT
CANISTER SUCT 1200ML W/VALVE (MISCELLANEOUS) ×4 IMPLANT
CATH EMBOLECTOMY 5FR (BALLOONS) ×2 IMPLANT
CATH TRAY 16F METER LATEX (MISCELLANEOUS) ×4 IMPLANT
CLIP APPLIE 11 MED OPEN (CLIP) IMPLANT
CLIP APPLIE 9.375 SM OPEN (CLIP) IMPLANT
CONNECTOR Y WND VAC (MISCELLANEOUS) IMPLANT
COUNTER SPONGE BAG EZ (MISCELLANEOUS) ×1
COVER PROBE FLX POLY STRL (MISCELLANEOUS) ×2 IMPLANT
CorMatrix ECM for vascular Repair ×2 IMPLANT
DRAPE INCISE IOBAN 66X45 STRL (DRAPES) ×4 IMPLANT
DRAPE ISOLATE BAG 20X20 STRL (DRAPES) ×2
DRAPE WOUND VAC 10X15X1CM (MISCELLANEOUS) ×2 IMPLANT
DRSG MEPITEL 4X7.2 (GAUZE/BANDAGES/DRESSINGS) ×2 IMPLANT
DRSG VAC ATS MED SENSATRAC (GAUZE/BANDAGES/DRESSINGS) ×2 IMPLANT
DURAPREP 26ML APPLICATOR (WOUND CARE) ×4 IMPLANT
ELECT CAUTERY BLADE 6.4 (BLADE) ×4 IMPLANT
GLOVE SURG SYN 8.0 (GLOVE) ×4 IMPLANT
GLOVE SURG SYN 8.0 PF PI (GLOVE) ×2 IMPLANT
GOWN STRL REUS W/ TWL LRG LVL3 (GOWN DISPOSABLE) ×2 IMPLANT
GOWN STRL REUS W/ TWL XL LVL3 (GOWN DISPOSABLE) ×2 IMPLANT
GOWN STRL REUS W/TWL LRG LVL3 (GOWN DISPOSABLE) ×4
GOWN STRL REUS W/TWL XL LVL3 (GOWN DISPOSABLE) ×4
HEMOSTAT SURGICEL 2X3 (HEMOSTASIS) ×4 IMPLANT
IV NS 500ML (IV SOLUTION) ×4
IV NS 500ML BAXH (IV SOLUTION) ×2 IMPLANT
KIT CATH CVC 3 LUMEN 7FR 8IN (MISCELLANEOUS) ×2 IMPLANT
KIT RM TURNOVER STRD PROC AR (KITS) ×4 IMPLANT
KIT TRANSPAC II SGL 4260605 (MISCELLANEOUS) ×2 IMPLANT
LABEL OR SOLS (LABEL) ×4 IMPLANT
LIQUID BAND (GAUZE/BANDAGES/DRESSINGS) ×4 IMPLANT
LOOP RED MAXI  1X406MM (MISCELLANEOUS) ×4
LOOP VESSEL MAXI 1X406 RED (MISCELLANEOUS) ×4 IMPLANT
LOOP VESSEL MINI 0.8X406 BLUE (MISCELLANEOUS) ×4 IMPLANT
LOOPS BLUE MINI 0.8X406MM (MISCELLANEOUS) ×6
NDL HYPO 18GX1.5 BLUNT FILL (NEEDLE) ×2 IMPLANT
NEEDLE HYPO 18GX1.5 BLUNT FILL (NEEDLE) ×4 IMPLANT
NS IRRIG 500ML POUR BTL (IV SOLUTION) ×4 IMPLANT
PACK ANGIOGRAPHY (CUSTOM PROCEDURE TRAY) ×2 IMPLANT
PACK BASIN MAJOR ARMC (MISCELLANEOUS) ×4 IMPLANT
PACK UNIVERSAL (MISCELLANEOUS) ×4 IMPLANT
PAD GROUND ADULT SPLIT (MISCELLANEOUS) ×4 IMPLANT
PATCH CAROTID ECM VASC 1X10 (Prosthesis & Implant Heart) ×2 IMPLANT
STAPLER SKIN PROX 35W (STAPLE) ×2 IMPLANT
SUT MNCRL+ 5-0 UNDYED PC-3 (SUTURE) ×2 IMPLANT
SUT MONOCRYL 5-0 (SUTURE) ×2
SUT PROLENE 5 0 RB 1 DA (SUTURE) ×8 IMPLANT
SUT PROLENE 6 0 BV (SUTURE) ×18 IMPLANT
SUT PROLENE BV 1 BLUE 7-0 30IN (SUTURE) ×16 IMPLANT
SUT SILK 2 0 (SUTURE) ×4
SUT SILK 2-0 18XBRD TIE 12 (SUTURE) ×2 IMPLANT
SUT SILK 3 0 (SUTURE) ×4
SUT SILK 3-0 18XBRD TIE 12 (SUTURE) ×2 IMPLANT
SUT SILK 4 0 (SUTURE) ×4
SUT SILK 4-0 18XBRD TIE 12 (SUTURE) ×2 IMPLANT
SUT VIC AB 0 CT1 36 (SUTURE) ×4 IMPLANT
SUT VIC AB 2-0 CT1 27 (SUTURE) ×8
SUT VIC AB 2-0 CT1 TAPERPNT 27 (SUTURE) ×4 IMPLANT
SUT VIC AB 3-0 SH 27 (SUTURE) ×4
SUT VIC AB 3-0 SH 27X BRD (SUTURE) ×2 IMPLANT
SUT VICRYL+ 3-0 36IN CT-1 (SUTURE) ×8 IMPLANT
SYR 20CC LL (SYRINGE) ×4 IMPLANT
SYR 5ML LL (SYRINGE) ×4 IMPLANT
WIRE ROSEN-J .035X260CM (WIRE) ×2 IMPLANT
WND VAC CONN Y (MISCELLANEOUS) ×2

## 2015-02-01 NOTE — Op Note (Signed)
OPERATIVE NOTE   PROCEDURE: 1. Insertion of triple-lumen central venous catheter right IJ approach.  PRE-OPERATIVE DIAGNOSIS: Ischemic left lower extremity; complication vascular device; lack of appropriate IV access  POST-OPERATIVE DIAGNOSIS: Same  SURGEON: Katha Cabal M.D.  ANESTHESIA: 1% lidocaine local infiltration  ESTIMATED BLOOD LOSS: Minimal cc  INDICATIONS:   Jennifer Simpson is a 74 y.o. female who presents with ischemia of the left lower extremity she will require urgent operation and revascularization. She therefore requires appropriate intravenous access for a surgical procedure of this magnitude. The risks and benefits were reviewed all questions of been answered patient has agreed to proceed.  DESCRIPTION: After obtaining full informed written consent, the patient was positioned supine. The right IJ and chest wall was prepped and draped in a sterile fashion. Ultrasound was placed in a sterile sleeve. Ultrasound was utilized to identify the internal jugular vein which is noted to be echolucent and compressible indicating patency. Images recorded for the permanent record. Under real-time visualization a Seldinger needle is inserted into the vein and the guidewires advanced without difficulty. Small counterincision was made at the wire insertion site. Dilators passed over the wire and the triple-lumen catheter is fed without difficulty.  All 3 lm aspirate and flush easily and are packed with heparin saline. Catheter secured to the skin of the right neck with 2-0 silk. A sterile dressing is applied with Biopatch.  COMPLICATIONS: None  CONDITION: Unchanged  Katha Cabal, M.D. [facility]. Office:  [phone]   02/01/2015, 10:12 PM

## 2015-02-01 NOTE — Transfer of Care (Signed)
Immediate Anesthesia Transfer of Care Note  Patient: Jennifer Simpson  Procedure(s) Performed: Procedure(s): ENDARTERECTOMY FEMORAL (N/A) INSERTION OF ILIAC STENT (Left)  Patient Location: PACU  Anesthesia Type:General  Level of Consciousness: awake, alert , oriented and patient cooperative  Airway & Oxygen Therapy: Patient connected to face mask oxygen  Post-op Assessment: Report given to RN and Post -op Vital signs reviewed and stable  Post vital signs: Reviewed and stable  Last Vitals:  Filed Vitals:   02/01/15 2144  BP:   Pulse:   Temp: 75.8 C    Complications: No apparent anesthesia complications

## 2015-02-01 NOTE — Anesthesia Postprocedure Evaluation (Signed)
  Anesthesia Post-op Note  Patient: Jennifer Simpson  Procedure(s) Performed: Procedure(s): ENDARTERECTOMY FEMORAL (N/A) INSERTION OF ILIAC STENT (Left)  Anesthesia type:General  Patient location: PACU  Post pain: Pain level controlled  Post assessment: Post-op Vital signs reviewed, Patient's Cardiovascular Status Stable, Respiratory Function Stable, Patent Airway and No signs of Nausea or vomiting  Post vital signs: Reviewed and stable  Last Vitals:  Filed Vitals:   02/01/15 2144  BP:   Pulse:   Temp: 36.3 C    Level of consciousness: awake, alert  and patient cooperative  Complications: No apparent anesthesia complications

## 2015-02-01 NOTE — Anesthesia Preprocedure Evaluation (Addendum)
Anesthesia Evaluation  Patient identified by MRN, date of birth, ID band  Reviewed: Allergy & Precautions, NPO status , Patient's Chart, lab work & pertinent test results  History of Anesthesia Complications Negative for: history of anesthetic complications  Airway Mallampati: II       Dental  (+) Edentulous Upper, Partial Lower   Pulmonary asthma , COPD COPD inhaler, Current Smoker,  + rhonchi   + decreased breath sounds      Cardiovascular hypertension, Pt. on medications + Peripheral Vascular Disease Normal cardiovascular exam    Neuro/Psych Anxiety negative neurological ROS     GI/Hepatic Neg liver ROS, PUD, GERD-  Medicated,  Endo/Other  negative endocrine ROS  Renal/GU Renal hypertensionRenal disease  negative genitourinary   Musculoskeletal negative musculoskeletal ROS (+)   Abdominal Normal abdominal exam  (+)   Peds negative pediatric ROS (+)  Hematology negative hematology ROS (+)   Anesthesia Other Findings   Reproductive/Obstetrics negative OB ROS                           Anesthesia Physical Anesthesia Plan  ASA: III and emergent  Anesthesia Plan: General   Post-op Pain Management:    Induction: Intravenous  Airway Management Planned: Oral ETT  Additional Equipment:   Intra-op Plan:   Post-operative Plan: Extubation in OR  Informed Consent: I have reviewed the patients History and Physical, chart, labs and discussed the procedure including the risks, benefits and alternatives for the proposed anesthesia with the patient or authorized representative who has indicated his/her understanding and acceptance.     Plan Discussed with: CRNA  Anesthesia Plan Comments:         Anesthesia Quick Evaluation

## 2015-02-01 NOTE — Anesthesia Procedure Notes (Signed)
Procedure Name: Intubation Performed by: Lendon Colonel Pre-anesthesia Checklist: Emergency Drugs available, Patient identified, Suction available, Patient being monitored and Timeout performed Patient Re-evaluated:Patient Re-evaluated prior to inductionOxygen Delivery Method: Circle system utilized Preoxygenation: Pre-oxygenation with 100% oxygen Intubation Type: IV induction, Cricoid Pressure applied and Rapid sequence Laryngoscope Size: Miller and 2 Grade View: Grade II Tube type: Oral Number of attempts: 1 Airway Equipment and Method: Stylet Placement Confirmation: ETT inserted through vocal cords under direct vision Secured at: 20 cm Tube secured with: Tape Dental Injury: Teeth and Oropharynx as per pre-operative assessment  Comments: Head remained in neutral position for intubation

## 2015-02-01 NOTE — H&P (Signed)
[facility] Admission History & Physical  MRN : [id number]  Jennifer Simpson is a 74 y.o. (26-Feb-1941) female who presents with chief complaint of No chief complaint on file. Marland Kitchen  History of Present Illness: The patient is a 74 year old woman who presents as a direct admit from my office with acute ischemia of her left lower extremity and excruciating pain. Yesterday she underwent a diagnostic angiogram with a left femoral approach.  StarClose was deployed without difficulty under fluoroscopic guidance. She was discharged after several hours without incident.  Apparently early this morning she awoke with the abrupt onset of left leg pain it was so severe she called EMS and was evaluated by the squad. At that time it was decided that she did not need to be brought to the hospital. Subsequently the pain continued my office was called at lunchtime. She was set up for an ultrasound immediately. The ultrasound revealed occlusion of the external iliac and common femoral on the left. She was subsequent transferred to [facility] directly. I have evaluated her and I believe she needs surgical intervention this evening.  Current Facility-Administered Medications  Medication Dose Route Frequency Provider Last Rate Last Dose  . 0.9 %  sodium chloride infusion   Intravenous Continuous Katha Cabal, MD      . acetaminophen (TYLENOL) tablet 650 mg  650 mg Oral Q6H PRN Katha Cabal, MD       Or  . acetaminophen (TYLENOL) suppository 650 mg  650 mg Rectal Q6H PRN Katha Cabal, MD      . diltiazem (CARDIZEM CD) 24 hr capsule 240 mg  240 mg Oral Daily Katha Cabal, MD      . heparin ADULT infusion 100 units/mL (25000 units/250 mL)  15 Units/kg/hr Intravenous Continuous Katha Cabal, MD      . hydrALAZINE (APRESOLINE) tablet 10 mg  10 mg Oral TID Katha Cabal, MD      . HYDROmorphone (DILAUDID) injection 1 mg  1 mg Intravenous Q6H PRN Katha Cabal, MD      . ipratropium-albuterol (DUONEB) 0.5-2.5 (3) MG/3ML nebulizer solution 3 mL  3 mL Inhalation Q6H PRN Katha Cabal, MD      . losartan (COZAAR) tablet 100 mg  100 mg Oral Daily Katha Cabal, MD      . morphine 2 MG/ML injection 2 mg  2 mg Intravenous Q4H PRN Katha Cabal, MD      . morphine 4 MG/ML injection 4 mg  4 mg Intravenous Q4H PRN Katha Cabal, MD      . spironolactone (ALDACTONE) tablet 25 mg  25 mg Oral Daily Katha Cabal, MD      . vancomycin (VANCOCIN) IVPB 1000 mg/200 mL premix  1,000 mg Intravenous To OR Katha Cabal, MD        Past Medical History  Diagnosis Date  . Asthma   . Peripheral vascular disease     a. h/o iliac stents 2010. b. RAS as below.  . History of skin ulcer   . Hypertension   . GERD (gastroesophageal reflux disease)   . Squamous cell carcinoma of oropharynx 2008     status post chemotherapy and radiation therapy in 2008  . Cancer of esophagus 2008    s/p chemo and radiation  . History of stomach ulcers   . Hyponatremia     Previously related to HCTZ/chlorthalidone  . Clotting disorder  left leg  . Chronic kidney disease   . Erythrocytosis 01/15/2015  . Lower leg DVT (deep venous thrombosis) 01/15/2015    Past Surgical History  Procedure Laterality Date  . Peripheral arterial stent graft Bilateral     x2 STENTS BOTH LEGS  . Anterior cervical decomp/discectomy fusion  ~ 2006    "put in spacer" (12/02/2012)  . Portacath placement  2008  . Anterior cervical decomp/discectomy fusion  10/07/2012    Procedure: ANTERIOR CERVICAL DECOMPRESSION/DISCECTOMY FUSION 2 LEVEL/HARDWARE REMOVAL;  Surgeon: Ophelia Charter, MD;  Location: [facility];  Service: Neurosurgery;  Laterality: N/A;  Cervical three-four, cervical four-five Anterior cervical decompression/diskectomy/fusion/interbody plating and prosthesis/removal of spinal concept plate  . Cataract extraction w/ intraocular lens  implant, bilateral  ~2005   . Renal artery stent Right 12/02/2012    Successful angioplasty and bare-metal stent placement to the ostial right renal artery  . Tonsillectomy  ~ 1946  . Breast cyst excision Right ?1980's    negative  . Renal angiogram N/A 12/02/2012    Procedure: RENAL ANGIOGRAM;  Surgeon: Wellington Hampshire, MD;  Location: [facility];  Service: Cardiovascular;  Laterality: N/A;  . Peripheral vascular catheterization Right 01/31/2015    Procedure: Lower Extremity Angiography;  Surgeon: Katha Cabal, MD;  Location: [facility];  Service: Cardiovascular;  Laterality: Right;  . Peripheral vascular catheterization  01/31/2015    Procedure: Lower Extremity Intervention;  Surgeon: Katha Cabal, MD;  Location: [place] [facility];  Service: Cardiovascular;;    Social History History  Substance Use Topics  . Smoking status: Current Every Day Smoker -- 0.25 packs/day for 50 years    Types: Cigarettes  . Smokeless tobacco: Never Used     Comment: 12/02/2012 offered smoking cessation materials; Electronic cigarette  . Alcohol Use: 3.0 oz/week    5 Glasses of wine per week     Comment: wine occasional    Family History Family History  Problem Relation Age of Onset  . Diabetes Sister   . Cancer Neg Hx   . Stroke Neg Hx    no family history of autoimmune disease or porphyria  Allergies  Allergen Reactions  . Trimethobenzamide Other (See Comments)    Eye allergy, whites of eyes turned red, for about two weeks, there after.    Marland Kitchen Amoxicillin-Pot Clavulanate Nausea And Vomiting    vomiting after 45 min of taking pill  . Ceftin [Cefuroxime Axetil] Other (See Comments)    Unknown   . Chlorthalidone     hyponatremia  . Hctz [Hydrochlorothiazide]     Hyponatremia   . Polymyxin B-Trimethoprim Itching    eyes red  itching  . Tequin [Gatifloxacin] Itching     REVIEW OF SYSTEMS (Negative unless checked)  Constitutional: [] Weight loss  [] Fever  [] Chills Cardiac: [] Chest pain   [] Chest  pressure   [] Palpitations   [] Shortness of breath when laying flat   [] Shortness of breath at rest   [] Shortness of breath with exertion. Vascular:  [x] Pain in legs with walking   [x] Pain in legs at rest   [x] Pain in legs when laying flat   [] Claudication   [x] Pain in feet when walking  [x] Pain in feet at rest  [x] Pain in feet when laying flat   [] History of DVT   [] Phlebitis   [] Swelling in legs   [] Varicose veins   [] Non-healing ulcers Pulmonary:   [] Uses home oxygen   [] Productive cough   [] Hemoptysis   [] Wheeze  [] COPD   []   Asthma Neurologic:  [] Dizziness  [] Blackouts   [] Seizures   [] History of stroke   [] History of TIA  [] Aphasia   [] Temporary blindness   [] Dysphagia   [] Weakness or numbness in arms   [x] Weakness or numbness in legs Musculoskeletal:  [] Arthritis   [] Joint swelling   [] Joint pain   [] Low back pain Hematologic:  [] Easy bruising  [] Easy bleeding   [] Hypercoagulable state   [] Anemic  [] Hepatitis Gastrointestinal:  [] Blood in stool   [] Vomiting blood  [] Gastroesophageal reflux/heartburn   [] Difficulty swallowing. Genitourinary:  [] Chronic kidney disease   [] Difficult urination  [] Frequent urination  [] Burning with urination   [] Blood in urine Skin:  [] Rashes   [] Ulcers   [] Wounds Psychological:  [] History of anxiety   []  History of major depression.  Physical Examination  Filed Vitals:   02/01/15 1721  BP: 143/66  Pulse: 83  SpO2: 99%   There is no weight on file to calculate BMI.  Head: [place]/AT, No temporalis wasting. Prominent temp pulse not noted. Ear/Nose/Throat: Hearing grossly intact, nares w/o erythema or drainage, oropharynx w/o Erythema/Exudate, Mallampati score: Class II.  Dentition poor Eyes: PERRLA, EOMI.  Neck: Supple, no nuchal rigidity.  No bruit or JVD.  Pulmonary:  Good air movement, clear to auscultation bilaterally.  Cardiac: RRR, normal S1, S2, no Murmurs, rubs or gallops. Vascular: Left foot is cold pale without pulses there is no capillary refill  clearly the foot is ischemic  Vessel Right Left  Radial Palpable Palpable  Ulnar Palpable Palpable  Brachial Palpable Palpable  Carotid Palpable, without bruit Palpable, without bruit  Aorta Not palpable N/A  Femoral  not Palpable  not.Palpable  Popliteal  not Palpable  not Palpable  PT  not Palpable  not Palpable  DP  not Palpable  not Palpable   Gastrointestinal: soft, non-tender/non-distended. No guarding/reflex. No masses, surgical incisions, or scars. Musculoskeletal: M/S 5/5on the right no motor or sensory left foot.   left leg with ischemic changes.  No deformity or atrophy. No edema. Neurologic: CN 2-12 intact. Pain and light touch intact in right leg. Sensory motor deficit left foot  Symmetrical.  Speech is fluent. Motor exam as listed above. Psychiatric: Judgment intact, Mood & affect appropriate for pt's clinical situation. Dermatologic: No rashes or ulcers noted.  No cellulitis or open wounds. Lymph : No Cervical, Axillary, or Inguinal lymphadenopathy.  Diagnostic Studies  Non-Invasive Vascular Imaging aorta iliac  Duplex (Date: 02/01/2015)Shows occlusion of the left external iliac and common femoral on the left  CBC Lab Results  Component Value Date   WBC 6.1 11/28/2014   HGB 15.0 11/28/2014   HCT 41.9 01/18/2015   MCV 90.4 11/28/2014   PLT 324.0 11/28/2014    BMET    Component Value Date/Time   NA 131* 11/28/2014 1535   NA 133* 10/20/2014 1603   NA 131* 04/22/2013 1546   K 4.8 11/28/2014 1535   K 4.0 04/22/2013 1546   CL 100 11/28/2014 1535   CL 100 04/22/2013 1546   CO2 23 11/28/2014 1535   CO2 23 04/22/2013 1546   GLUCOSE 93 11/28/2014 1535   GLUCOSE 99 10/20/2014 1603   GLUCOSE 82 04/22/2013 1546   BUN 24* 01/31/2015 0922   BUN 13 10/20/2014 1603   BUN 21* 04/22/2013 1546   CREATININE 1.38* 01/31/2015 0922   CREATININE 1.34* 09/14/2014 1426   CALCIUM 9.4 11/28/2014 1535   CALCIUM 8.0* 09/14/2014 1426   GFRNONAA 36* 01/31/2015 0922   GFRNONAA  44* 01/12/2014 1420  GFRAA 42* 01/31/2015 0922   GFRAA 51* 01/12/2014 1420   CrCl cannot be calculated (Unknown ideal weight.).  COAG Lab Results  Component Value Date   INR 1.15 01/31/2015   INR 2.18 01/24/2015   INR 1.64 01/18/2015    Radiology See duplex report above   Assessment/Plan 1.  Acute ischemia of the left lower extremity with motor sensory loss the patient will require urgent surgery and this will be arranged for this evening. I will plan for a femoral endarterectomy with thromboembolectomy of the iliac and likely stenting. I'm uncertain as to whether this represents a complication of the*close device of the lab doubtful as she tolerated the post angiogram for many hours before developing this problem I am more suspicious this represents an acute event within the heavily diseased iliac artery that was identified on angiogram yesterday. She may need fasciotomies. The risks and benefits of been reviewed the patient is in extreme pain and wishes for everything to be done both to limit alleviate her pain and save her foot 2.  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 She will require open surgical repair of the right common femoral and iliac system however given the urgency of this evening's situation I do not believe that adding the right-sided operation is appropriate at this time furthermore we have not had the opportunity to obtain a cardiac evaluation given her history of coronary artery disease therefore we will proceed only with the emergent repair 3.  Coronary artery disease the patient denies recent episodes of chest pain she has not been using nitroglycerin of late we will proceed with urgent repair on the left. She will be monitored postoperatively and maintained on telemetry initially 4.  COPD with asthma we will all plan to extubate postoperatively and she will be maintained in the intensive care unit. Both medicine  and the ICU service will be asked to follow for her medical comorbidities 5.  Hypertension she will be maintained on her home medications once her blood pressures verified postoperatively again medicine will be asked to help with her medical comorbidities he does have a history of renal artery stenosis on yesterday's angiogram she appears to have significant bilateral restenosis although the right one appears reasonable the left appears to be a subtotal occlusion  Lafonda Patron, Dolores Lory, MD  02/01/2015 5:44 PM

## 2015-02-01 NOTE — Op Note (Signed)
OPERATIVE NOTE   PROCEDURE: 1. left common femoral endarterectomy with Cormatrix patch angioplasty 2. Left profunda femoris endarterectomy with Cormatrix patch angioplasty 3. Thromboembolectomy left common and external iliac artery 4. Open angioplasty of the left common iliac artery to 7 mm 5. 4 compartment fasciotomies via medial and lateral incisions left calf  PRE-OPERATIVE DIAGNOSIS: Ischemia of left lower extremity; complication of vascular device with thrombosis of common iliac artery stent; atherosclerotic occlusive disease bilateral lower extremities with ulceration of the right ankle; atherosclerotic occlusive disease of the renal arteries with bilateral renal artery stenting with significant in-stent restenosis  POST-OPERATIVE DIAGNOSIS: Same  SURGEON: Katha Cabal, MD  ASSISTANT(S): None  ANESTHESIA: general  ESTIMATED BLOOD LOSS: 150 cc  FINDING(S): 1. Extensive plaque within the common femoral and profunda femoris, thrombus within the iliac artery; critical stenosis of the common iliac artery within a remotely placed stent  SPECIMEN(S):  Plaque associated with thrombosis; Star close identified on the outside of the artery in the proper location sent as a separate specimen  INDICATIONS:   Jennifer Simpson is a 74 y.o. female who presents with ischemia of the left leg. She describes the onset of pain yesterday in the early morning subsequently she did contact the office and an ultrasound was done which documented occlusion within the external iliac and common femoral arteries. She has critical limb changes and therefore is undergoing emergent operation for limb salvage.  DESCRIPTION: After obtaining full informed written consent, the patient was brought back to the operating room and placed supine upon the operating table.  The patient received IV antibiotics prior to induction.  After obtaining adequate anesthesia, the patient was prepped and draped in the  standard fashion for: left femoral exposure.  Attention was turned to the left groin.  Under Sonosite guidance, I marked the common femoral artery on the skin.  I made an incision over the common femoral artery and dissected down to the common femoral artery with electrocautery.  I dissected out the common femoral artery from the distal external iliac artery down to the femoral bifurcation.  On initial inspection, the common femoral artery was: Pulseless and had a very large amount of plaque by palpation.  Based on the exam, I elected to proceed with: left femoral endarterectomy.  I dissected out all circumflex branches and the profunda femoral artery, down to the tertiary branches, and superficial femoral artery.  Control of all branches was obtained with vessel loops.  I found a softer area in the distal external iliac artery amendable to clamping.  The patient was given 4000 units of Heparin intravenously, which was a therapeutic bolus.  In total, 4000 units of Heparin was administrated to achieve and maintain a therapeutic level of anticoagulation.  After waiting 3 minutes, I clamped the distal external iliac artery and placed all circumflex branches, and the profunda and superficial femoral arteries under tension.  I made an arteriotomy in the common femoral artery with a 11-blade and extended it with a Potts scissor proximally and distally.  Fresh thrombus as well as a large burden of plaque is identified.  Based on the exam, the plan was: To extend the arteriotomy down past the first major bifurcation within the profunda femoris and then proximally to the level of the circumflex vessels.    I began the endarterectomy in the mid-common femoral artery.  I transected the plaque at the level of the circumflex vessels then carried the dissection distally into the superficial femoral and profunda  femoral arteries. The profunda femoris endarterectomy extended down to the first bifurcation  The plaque feathered out  in both arteries.  4 interrupted 7-0 Prolene sutures were then used to tack the intimal edge within the profunda femoris  At this point, I fashioned a core matrix patch for the geometry of the arteriotomy.  The patch was sewn to the artery with 2 running stitches of 6-0 Prolene, running from each end.  Prior to completing the patch angioplasty, I backbled the profunda femoral artery: The profunda femoris was then irrigated with heparinized saline using a Marx tip. The suture line within the patch was completed along the lateral wall and two thirds of the medial wall. At this point leaving approximately a 1 cm gap in the suture line under direct visualization a Rosen wire was advanced up the external iliac in a retrograde fashion. Approximately 10 cm the wire met resistance. An 8 French sheath was then slid over the wire and vascular control around the sheath was assisted with a Silastic vessel loop. C-arm was then brought into the field and under fluoroscopy the Rosen wire in combination with a Kumpe catheter was advanced past the obstruction through the common iliac stent and into the aorta. Hand injection contrast verified placement of the catheter within the aorta and also demonstrated flow down the right common iliac but absence of flow within the left common and external iliac.  A #5 Fogarty over the wire catheter was then advanced up into the aorta inflated and a single pass was made. The balloon ruptured against a sharp plaque in the mid common iliac. However this did restore flow. A 7 x 4 Dorado balloon was then advanced up through the 8 [place] sheath positioned across the iliac stent and 2 separate angioplasties were performed 1-10 atm and went to 16 atm across the entire length of the previous remotely placed stent. At this point a strong pulse was now appreciated at the distal external palpation. Forward flow was allowed to flush any residual thrombus. It should be noted that prior to advancing the  Fogarty all the way into the aorta it was advanced up to the point it met resistance within the proximal external iliac and a significant amount of thrombus was retrieved from the external iliac itself.  Having completed both thrombectomy as well as angioplasty a pressure cancer was then connected to the sheath and the tip of the sheath was positioned in the disks distal external. Pressure measurements were recorded with systolics of 400 mmHg while the noninvasive cuff was registering systolics of 867 mmHg. Angiography demonstrated patency of the iliac stent as well as the external iliac. Given these findings I elected to remove the wire and the sheath and the suture line and the patch was completed after extensive flushing. Flow was then reestablished into the profunda femoris with an excellent pulse noted by palpation distally.  The wound was then irrigated with approximately 100 cc of sterile saline  Bleeding points were controlled with electrocautery.  The incision was repaired with a double layer of 2-0 Vicryl, a double layer of 3-0 Vicryl, and a layer of surgical staples  The skin was cleaned, dried, and honeycombed dressing was applied   Attention was then turned to the calf where a medial incision was made the fascia was then exposed and using Metzenbaum scissors the superficial and deep compartments were opened down to the ankle level. The knee was then rotated medially and a linear incision was made along the  palpable fascial band between the anterior and lateral compartments the fascia was exposed and then using 2 separate incisions the fascia was opened in the anterior compartment and the lateral compartment beginning from the tibial plateau down to the ankle. Both wounds were inspected for hemostasis and subsequently Mepitel was placed followed by a VAC dressing  COMPLICATIONS: None  CONDITION: Guarded  Katha Cabal, M.D. [facility] Office: [phone]  02/01/2015,  10:15 PM

## 2015-02-02 ENCOUNTER — Encounter
Admission: AD | Disposition: A | Discharge status: Hospice | Payer: Medicare Other | Source: Ambulatory Visit | Attending: Vascular Surgery

## 2015-02-02 ENCOUNTER — Inpatient Hospital Stay: Payer: Medicare Other

## 2015-02-02 ENCOUNTER — Encounter: Payer: Self-pay | Admitting: *Deleted

## 2015-02-02 DIAGNOSIS — I998 Other disorder of circulatory system: Secondary | ICD-10-CM | POA: Diagnosis present

## 2015-02-02 HISTORY — PX: ENDARTERECTOMY FEMORAL: SHX5804

## 2015-02-02 HISTORY — PX: FEMORAL-POPLITEAL BYPASS GRAFT: SHX937

## 2015-02-02 HISTORY — PX: INSERTION OF ILIAC STENT: SHX6256

## 2015-02-02 LAB — BASIC METABOLIC PANEL
Anion gap: 5 (ref 5–15)
BUN: 19 mg/dL (ref 6–20)
CHLORIDE: 104 mmol/L (ref 101–111)
CO2: 21 mmol/L — ABNORMAL LOW (ref 22–32)
Calcium: 7.7 mg/dL — ABNORMAL LOW (ref 8.9–10.3)
Creatinine, Ser: 1.14 mg/dL — ABNORMAL HIGH (ref 0.44–1.00)
GFR calc non Af Amer: 46 mL/min — ABNORMAL LOW (ref >60)
GFR, EST AFRICAN AMERICAN: 53 mL/min — AB (ref >60)
Glucose, Bld: 111 mg/dL — ABNORMAL HIGH (ref 65–99)
Potassium: 4.5 mmol/L (ref 3.5–5.1)
SODIUM: 130 mmol/L — AB (ref 135–145)

## 2015-02-02 LAB — CBC
HCT: 33.9 % — ABNORMAL LOW (ref 35.0–47.0)
HEMOGLOBIN: 11.3 g/dL — AB (ref 12.0–16.0)
MCH: 33.7 pg (ref 26.0–34.0)
MCHC: 33.3 g/dL (ref 32.0–36.0)
MCV: 101.4 fL — AB (ref 80.0–100.0)
PLATELETS: 199 10*3/uL (ref 150–440)
RBC: 3.35 MIL/uL — ABNORMAL LOW (ref 3.80–5.20)
RDW: 21.2 % — ABNORMAL HIGH (ref 11.5–14.5)
WBC: 9.2 10*3/uL (ref 3.6–11.0)

## 2015-02-02 LAB — CBC WITH DIFFERENTIAL/PLATELET
Basophils Absolute: 0 10*3/uL (ref 0–0.1)
Basophils Relative: 0 %
EOS ABS: 0 10*3/uL (ref 0–0.7)
Eosinophils Relative: 0 %
HCT: 32.8 % — ABNORMAL LOW (ref 35.0–47.0)
Hemoglobin: 10.5 g/dL — ABNORMAL LOW (ref 12.0–16.0)
LYMPHS ABS: 0.2 10*3/uL — AB (ref 1.0–3.6)
LYMPHS PCT: 3 %
MCH: 32.9 pg (ref 26.0–34.0)
MCHC: 32 g/dL (ref 32.0–36.0)
MCV: 102.7 fL — ABNORMAL HIGH (ref 80.0–100.0)
Monocytes Absolute: 0.8 10*3/uL (ref 0.2–0.9)
Monocytes Relative: 9 %
Neutro Abs: 7.8 10*3/uL — ABNORMAL HIGH (ref 1.4–6.5)
Neutrophils Relative %: 88 %
Platelets: 186 10*3/uL (ref 150–440)
RBC: 3.19 MIL/uL — ABNORMAL LOW (ref 3.80–5.20)
RDW: 21.6 % — ABNORMAL HIGH (ref 11.5–14.5)
WBC: 8.8 10*3/uL (ref 3.6–11.0)

## 2015-02-02 LAB — COMPREHENSIVE METABOLIC PANEL
ALBUMIN: 2.6 g/dL — AB (ref 3.5–5.0)
ALT: 50 U/L (ref 14–54)
ANION GAP: 8 (ref 5–15)
AST: 150 U/L — ABNORMAL HIGH (ref 15–41)
Alkaline Phosphatase: 58 U/L (ref 38–126)
BUN: 18 mg/dL (ref 6–20)
CALCIUM: 7.3 mg/dL — AB (ref 8.9–10.3)
CO2: 18 mmol/L — ABNORMAL LOW (ref 22–32)
Chloride: 103 mmol/L (ref 101–111)
Creatinine, Ser: 1.23 mg/dL — ABNORMAL HIGH (ref 0.44–1.00)
GFR calc Af Amer: 48 mL/min — ABNORMAL LOW (ref >60)
GFR calc non Af Amer: 42 mL/min — ABNORMAL LOW (ref >60)
Glucose, Bld: 140 mg/dL — ABNORMAL HIGH (ref 65–99)
Potassium: 4.6 mmol/L (ref 3.5–5.1)
SODIUM: 129 mmol/L — AB (ref 135–145)
Total Bilirubin: 0.8 mg/dL (ref 0.3–1.2)
Total Protein: 4.6 g/dL — ABNORMAL LOW (ref 6.5–8.1)

## 2015-02-02 LAB — LACTIC ACID, PLASMA: Lactic Acid, Venous: 2.6 mmol/L (ref 0.5–2.0)

## 2015-02-02 LAB — MAGNESIUM: Magnesium: 1 mg/dL — ABNORMAL LOW (ref 1.7–2.4)

## 2015-02-02 LAB — GLUCOSE, CAPILLARY: GLUCOSE-CAPILLARY: 156 mg/dL — AB (ref 65–99)

## 2015-02-02 SURGERY — ENDARTERECTOMY, FEMORAL
Anesthesia: General | Site: Groin | Laterality: Right | Wound class: Clean

## 2015-02-02 MED ORDER — CETYLPYRIDINIUM CHLORIDE 0.05 % MT LIQD: 7.0000 mL | Freq: Two times a day (BID) | OROMUCOSAL | Status: DC

## 2015-02-02 MED ORDER — CETYLPYRIDINIUM CHLORIDE 0.05 % MT LIQD
7.0000 mL | Freq: Four times a day (QID) | OROMUCOSAL | Status: DC
  Administered 2015-02-03 (×2): 7 mL via OROMUCOSAL

## 2015-02-02 MED ORDER — SODIUM CHLORIDE 0.9 % IV SOLN: 10000.0000 ug | INTRAVENOUS | Status: DC | PRN

## 2015-02-02 MED ORDER — LIDOCAINE HCL (CARDIAC) 20 MG/ML IV SOLN
INTRAVENOUS | Status: DC | PRN
  Administered 2015-02-02: 30 mg via INTRAVENOUS
  Administered 2015-02-02: 100 mg via INTRAVENOUS

## 2015-02-02 MED ORDER — EVICEL 5 ML EX KIT
PACK | CUTANEOUS | Status: DC | PRN
Start: 2015-02-02 — End: 2015-02-02
  Administered 2015-02-02: 10 mL

## 2015-02-02 MED ORDER — FENTANYL CITRATE (PF) 100 MCG/2ML IJ SOLN: 25.0000 ug | INTRAMUSCULAR | Status: DC | PRN

## 2015-02-02 MED ORDER — HYDROMORPHONE HCL 1 MG/ML IJ SOLN
0.5000 mg | INTRAMUSCULAR | Status: DC | PRN
  Filled 2015-02-02 (×2): qty 1

## 2015-02-02 MED ORDER — MAGNESIUM SULFATE 4 GM/100ML IV SOLN
4.0000 g | Freq: Once | INTRAVENOUS | Status: AC
  Administered 2015-02-02: 4 g via INTRAVENOUS
  Filled 2015-02-02: qty 100

## 2015-02-02 MED ORDER — LIDOCAINE HCL (CARDIAC) 20 MG/ML IV SOLN: INTRAVENOUS | Status: DC | PRN

## 2015-02-02 MED ORDER — MIDAZOLAM HCL 2 MG/2ML IJ SOLN
4.0000 mg | INTRAMUSCULAR | Status: DC | PRN
  Administered 2015-02-02 – 2015-02-03 (×2): 4 mg via INTRAVENOUS
  Filled 2015-02-02 (×2): qty 4

## 2015-02-02 MED ORDER — FAMOTIDINE IN NACL 20-0.9 MG/50ML-% IV SOLN
20.0000 mg | Freq: Every day | INTRAVENOUS | Status: DC
  Administered 2015-02-03 – 2015-02-07 (×5): 20 mg via INTRAVENOUS
  Filled 2015-02-02 (×6): qty 50

## 2015-02-02 MED ORDER — GLYCOPYRROLATE 0.2 MG/ML IJ SOLN
INTRAMUSCULAR | Status: DC | PRN
  Administered 2015-02-02: 0.4 mg via INTRAVENOUS

## 2015-02-02 MED ORDER — CHLORHEXIDINE GLUCONATE 0.12 % MT SOLN
15.0000 mL | Freq: Two times a day (BID) | OROMUCOSAL | Status: DC
  Administered 2015-02-02: 15 mL via OROMUCOSAL

## 2015-02-02 MED ORDER — PROPOFOL 10 MG/ML IV BOLUS
INTRAVENOUS | Status: DC | PRN
  Administered 2015-02-02: 120 mg via INTRAVENOUS

## 2015-02-02 MED ORDER — IOHEXOL 350 MG/ML SOLN
100.0000 mL | Freq: Once | INTRAVENOUS | Status: AC | PRN
  Administered 2015-02-02: 100 mL via INTRAVENOUS

## 2015-02-02 MED ORDER — CLINDAMYCIN PHOSPHATE 300 MG/50ML IV SOLN
300.0000 mg | INTRAVENOUS | Status: AC
  Administered 2015-02-03 – 2015-02-05 (×3): 300 mg via INTRAVENOUS
  Filled 2015-02-02 (×3): qty 50

## 2015-02-02 MED ORDER — SODIUM CHLORIDE 0.9 % IV BOLUS (SEPSIS): 1000.0000 mL | Freq: Once | INTRAVENOUS | Status: DC

## 2015-02-02 MED ORDER — SODIUM CHLORIDE 0.9 % IV SOLN
10000.0000 ug | INTRAVENOUS | Status: DC | PRN
  Administered 2015-02-02 (×3): 50 ug/min via INTRAVENOUS
  Administered 2015-02-02 (×2): 75 ug/min via INTRAVENOUS
  Administered 2015-02-02: 50 ug/min via INTRAVENOUS
  Administered 2015-02-02: 75 ug/min via INTRAVENOUS

## 2015-02-02 MED ORDER — FENTANYL CITRATE (PF) 100 MCG/2ML IJ SOLN
100.0000 ug | INTRAMUSCULAR | Status: DC | PRN
  Administered 2015-02-02 – 2015-02-03 (×3): 100 ug via INTRAVENOUS
  Filled 2015-02-02 (×3): qty 2

## 2015-02-02 MED ORDER — CLINDAMYCIN PHOSPHATE 600 MG/50ML IV SOLN
INTRAVENOUS | Status: DC | PRN
  Administered 2015-02-02: 300 mg via INTRAVENOUS
  Administered 2015-02-02: 600 mg via INTRAVENOUS

## 2015-02-02 MED ORDER — PHENYLEPHRINE HCL 10 MG/ML IJ SOLN
INTRAMUSCULAR | Status: DC | PRN
  Administered 2015-02-02 (×5): 100 ug via INTRAVENOUS
  Administered 2015-02-02: 50 ug via INTRAVENOUS
  Administered 2015-02-02 (×3): 100 ug via INTRAVENOUS

## 2015-02-02 MED ORDER — ONDANSETRON HCL 4 MG/2ML IJ SOLN: 4.0000 mg | Freq: Once | INTRAMUSCULAR | Status: DC | PRN

## 2015-02-02 MED ORDER — ACETAMINOPHEN 10 MG/ML IV SOLN
INTRAVENOUS | Status: DC | PRN
  Administered 2015-02-02: 1000 mg via INTRAVENOUS

## 2015-02-02 MED ORDER — CHLORHEXIDINE GLUCONATE 0.12 % MT SOLN
15.0000 mL | Freq: Two times a day (BID) | OROMUCOSAL | Status: DC
  Administered 2015-02-02 (×2): 15 mL via OROMUCOSAL

## 2015-02-02 MED ORDER — LACTATED RINGERS IV SOLN
INTRAVENOUS | Status: DC | PRN
  Administered 2015-02-02 (×2): via INTRAVENOUS

## 2015-02-02 MED ORDER — NEOSTIGMINE METHYLSULFATE 10 MG/10ML IV SOLN
INTRAVENOUS | Status: DC | PRN
  Administered 2015-02-02: 2 mg via INTRAVENOUS

## 2015-02-02 MED ORDER — STERILE WATER FOR INJECTION IV SOLN
INTRAVENOUS | Status: DC
  Administered 2015-02-02 – 2015-02-03 (×2): via INTRAVENOUS
  Filled 2015-02-02 (×6): qty 850

## 2015-02-02 MED ORDER — MIDAZOLAM HCL 2 MG/2ML IJ SOLN
INTRAMUSCULAR | Status: DC | PRN
  Administered 2015-02-02: 2 mg via INTRAVENOUS

## 2015-02-02 MED ORDER — SODIUM CHLORIDE 0.9 % IV BOLUS (SEPSIS)
1000.0000 mL | Freq: Once | INTRAVENOUS | Status: AC
  Administered 2015-02-02: 1000 mL via INTRAVENOUS

## 2015-02-02 MED ORDER — NOREPINEPHRINE BITARTRATE 1 MG/ML IV SOLN: 0.0000 ug/min | INTRAVENOUS | Status: DC

## 2015-02-02 SURGICAL SUPPLY — 115 items
APPLIER CLIP 11 MED OPEN (CLIP)
APPLIER CLIP 9.375 SM OPEN (CLIP)
APR CLP MED 11 20 MLT OPN (CLIP)
APR CLP SM 9.3 20 MLT OPN (CLIP)
BAG COUNTER SPONGE EZ (MISCELLANEOUS) ×4 IMPLANT
BAG DECANTER STRL (MISCELLANEOUS) ×6 IMPLANT
BAG ISL LRG 20X20 DRWSTRG (DRAPES) ×6
BAG ISOLATATION DRAPE 20X20 ST (DRAPES) ×3 IMPLANT
BAG SPNG 4X4 CLR HAZ (MISCELLANEOUS) ×3
BALLN DORADO 6X60X80 (BALLOONS) ×4
BALLN DORADO7X100X80 (BALLOONS) ×8
BALLOON DORADO 6X60X80 (BALLOONS) IMPLANT
BALLOON DORADO7X100X80 (BALLOONS) IMPLANT
BLADE SURG 15 STRL LF DISP TIS (BLADE) ×3 IMPLANT
BLADE SURG 15 STRL SS (BLADE) ×4
BLADE SURG SZ11 CARB STEEL (BLADE) ×4 IMPLANT
BOOT SUTURE AID YELLOW STND (SUTURE) ×8 IMPLANT
BRUSH SCRUB 4% CHG (MISCELLANEOUS) ×4 IMPLANT
CANISTER SUCT 1200ML W/VALVE (MISCELLANEOUS) ×4 IMPLANT
CANNULA 5F STIFF (CANNULA) ×1 IMPLANT
CATH TRAY 16F METER LATEX (MISCELLANEOUS) ×3 IMPLANT
CLIP APPLIE 11 MED OPEN (CLIP) IMPLANT
CLIP APPLIE 9.375 SM OPEN (CLIP) IMPLANT
DEVICE PRESTO INFLATION (MISCELLANEOUS) ×2 IMPLANT
DRAPE INCISE IOBAN 66X45 STRL (DRAPES) ×8 IMPLANT
DRAPE ISOLATE BAG 20X20 STRL (DRAPES) ×2
DRAPE SHEET LG 3/4 BI-LAMINATE (DRAPES) ×4 IMPLANT
DRSG OPSITE POSTOP 4X10 (GAUZE/BANDAGES/DRESSINGS) ×1 IMPLANT
DRSG OPSITE POSTOP 4X12 (GAUZE/BANDAGES/DRESSINGS) ×1 IMPLANT
DRSG OPSITE POSTOP 4X6 (GAUZE/BANDAGES/DRESSINGS) ×1 IMPLANT
DRSG OPSITE POSTOP 4X8 (GAUZE/BANDAGES/DRESSINGS) ×2 IMPLANT
DRSG TEGADERM 4X10 (GAUZE/BANDAGES/DRESSINGS) ×6 IMPLANT
DRSG TEGADERM 4X4.75 (GAUZE/BANDAGES/DRESSINGS) ×6 IMPLANT
DRSG TELFA 3X8 NADH (GAUZE/BANDAGES/DRESSINGS) IMPLANT
DURAPREP 26ML APPLICATOR (WOUND CARE) ×10 IMPLANT
ELECT CAUTERY BLADE 6.4 (BLADE) ×4 IMPLANT
EVICEL 5ML SEALNT HUMAN B (Miscellaneous) ×2 IMPLANT
GAUZE XEROFORM 4X4 STRL (GAUZE/BANDAGES/DRESSINGS) ×1 IMPLANT
GEL ULTRASOUND 20GR AQUASONIC (MISCELLANEOUS) IMPLANT
GLOVE SURG SYN 8.0 (GLOVE) ×4 IMPLANT
GLOVE SURG SYN 8.0 PF PI (GLOVE) ×3 IMPLANT
GOWN STRL REUS W/ TWL LRG LVL3 (GOWN DISPOSABLE) ×3 IMPLANT
GOWN STRL REUS W/ TWL XL LVL3 (GOWN DISPOSABLE) ×6 IMPLANT
GOWN STRL REUS W/TWL LRG LVL3 (GOWN DISPOSABLE) ×4
GOWN STRL REUS W/TWL XL LVL3 (GOWN DISPOSABLE) ×8
HEAD CUTTING 'VALVULOTOME URSL (MISCELLANEOUS) ×3 IMPLANT
HEAD CUTTING VALVULOTOME LEMTR (MISCELLANEOUS) ×3 IMPLANT
HEMOSTAT SURGICEL 2X3 (HEMOSTASIS) ×6 IMPLANT
IV NS 500ML (IV SOLUTION) ×4
IV NS 500ML BAXH (IV SOLUTION) ×3 IMPLANT
KIT RM TURNOVER STRD PROC AR (KITS) ×4 IMPLANT
LABEL OR SOLS (LABEL) ×4 IMPLANT
LIQUID BAND (GAUZE/BANDAGES/DRESSINGS) ×6 IMPLANT
LOOP RED MAXI  1X406MM (MISCELLANEOUS) ×3
LOOP VESSEL MAXI 1X406 RED (MISCELLANEOUS) ×9 IMPLANT
LOOP VESSEL MINI 0.8X406 BLUE (MISCELLANEOUS) ×6 IMPLANT
LOOPS BLUE MINI 0.8X406MM (MISCELLANEOUS) ×2
NDL FILTER BLUNT 18X1 1/2 (NEEDLE) ×3 IMPLANT
NDL HYPO 18GX1.5 BLUNT FILL (NEEDLE) ×3 IMPLANT
NEEDLE FILTER BLUNT 18X 1/2SAF (NEEDLE)
NEEDLE FILTER BLUNT 18X1 1/2 (NEEDLE) IMPLANT
NEEDLE HYPO 18GX1.5 BLUNT FILL (NEEDLE) ×4 IMPLANT
NS IRRIG 1000ML POUR BTL (IV SOLUTION) ×3 IMPLANT
NS IRRIG 500ML POUR BTL (IV SOLUTION) ×3 IMPLANT
PACK ANGIOGRAPHY (CUSTOM PROCEDURE TRAY) ×1 IMPLANT
PACK BASIN MAJOR ARMC (MISCELLANEOUS) ×4 IMPLANT
PACK UNIVERSAL (MISCELLANEOUS) ×4 IMPLANT
PAD DRESSING TELFA 3X8 NADH (GAUZE/BANDAGES/DRESSINGS) ×6 IMPLANT
PAD GROUND ADULT SPLIT (MISCELLANEOUS) ×4 IMPLANT
PAD PREP 24X41 OB/GYN DISP (PERSONAL CARE ITEMS) ×4 IMPLANT
PATCH CAROTID ECM VASC 1X10 (Prosthesis & Implant Heart) ×1 IMPLANT
PENCIL ELECTRO HAND CTR (MISCELLANEOUS) IMPLANT
SPONGE LAP 18X18 5 PK (GAUZE/BANDAGES/DRESSINGS) ×3 IMPLANT
SPONGE XRAY 4X4 16PLY STRL (MISCELLANEOUS) ×2 IMPLANT
STAPLER SKIN PROX 35W (STAPLE) ×6 IMPLANT
STENT VIABAHN 7X100X120 (Permanent Stent) ×1 IMPLANT
STENT VIABAHN 7X50X120 (Permanent Stent) ×4 IMPLANT
STENT VIABAHN 7X5X120 7FR (Permanent Stent) IMPLANT
STENT VIABAHN 8X100X120 (Permanent Stent) ×8 IMPLANT
STENT VIABAHN 8X10X120 (Permanent Stent) IMPLANT
STENT VIABAHN 8X2.5X120 (Permanent Stent) IMPLANT
STENT VIABAHN 8X25X120 (Permanent Stent) ×4 IMPLANT
SUT ETHIBOND CT1 BRD #0 30IN (SUTURE) IMPLANT
SUT MNCRL+ 5-0 UNDYED PC-3 (SUTURE) ×3 IMPLANT
SUT MONOCRYL 5-0 (SUTURE)
SUT PROLENE 3 0 SH DA (SUTURE) ×3 IMPLANT
SUT PROLENE 5 0 RB 1 DA (SUTURE) ×6 IMPLANT
SUT PROLENE 6 0 BV (SUTURE) ×36 IMPLANT
SUT PROLENE 7 0 BV 1 (SUTURE) ×22 IMPLANT
SUT SILK 2 0 (SUTURE) ×4
SUT SILK 2 0 SH (SUTURE) ×3 IMPLANT
SUT SILK 2-0 18XBRD TIE 12 (SUTURE) ×3 IMPLANT
SUT SILK 3 0 (SUTURE) ×8
SUT SILK 3-0 18XBRD TIE 12 (SUTURE) ×3 IMPLANT
SUT SILK 4 0 (SUTURE) ×4
SUT SILK 4-0 18XBRD TIE 12 (SUTURE) ×3 IMPLANT
SUT VIC AB 0 CT1 36 (SUTURE) ×3 IMPLANT
SUT VIC AB 2-0 CT1 (SUTURE) ×10 IMPLANT
SUT VIC AB 2-0 CT1 27 (SUTURE) ×8
SUT VIC AB 2-0 CT1 TAPERPNT 27 (SUTURE) ×6 IMPLANT
SUT VIC AB 2-0 SH 27 (SUTURE)
SUT VIC AB 2-0 SH 27XBRD (SUTURE) IMPLANT
SUT VIC AB 3-0 SH 27 (SUTURE)
SUT VIC AB 3-0 SH 27X BRD (SUTURE) ×3 IMPLANT
SUT VICRYL+ 3-0 36IN CT-1 (SUTURE) ×12 IMPLANT
SYR 20CC LL (SYRINGE) ×4 IMPLANT
SYR 3ML LL SCALE MARK (SYRINGE) ×4 IMPLANT
SYR 5ML LL (SYRINGE) ×4 IMPLANT
TAPE UMBIL 1/8X18 RADIOPA (MISCELLANEOUS) ×3 IMPLANT
TOWEL OR 17X26 4PK STRL BLUE (TOWEL DISPOSABLE) ×4 IMPLANT
VALVULOTOME HEAD CUTTING LEMTR (MISCELLANEOUS) IMPLANT
VALVULOTOME HEAD CUTTING URSL (MISCELLANEOUS) IMPLANT
VALVULOTOME LEMAITRE (MISCELLANEOUS) ×4
VALVULOTOME URESIL (MISCELLANEOUS) ×4
WIRE G V18X300CM (WIRE) ×2 IMPLANT

## 2015-02-02 NOTE — Progress Notes (Addendum)
Initial Nutrition Assessment     INTERVENTIONS:  Cater to pt preferences once diet advanced. Pt would benefit from nutritional supplement once diet advanced.    NUTRITION DIAGNOSIS: Inadequate oral intake related to inability to eat as evidenced by NPO status.    GOAL:   (Goal is for diet to be progressed to solids upon follow-up)    MONITOR:   (Energy Intake, Anthropometrics, Electrolyte/Renal Profile, Digestive System)  REASON FOR ASSESSMENT:  Other (Comment), Malnutrition Screening Tool    ASSESSMENT:  Pt admitted with lower extremity ischemia s/p vascular intervention. Pt down for CT on visit today. Pt currently NPO, NPO/CL since admission. No intake recorded  Past Medical History  Diagnosis Date   Asthma    Peripheral vascular disease     a. h/o iliac stents 2010. b. RAS as below.   History of skin ulcer    Hypertension    GERD (gastroesophageal reflux disease)    History of stomach ulcers    Hyponatremia     Previously related to HCTZ/chlorthalidone   Clotting disorder     left leg   Chronic kidney disease    Erythrocytosis 01/15/2015   Lower leg DVT (deep venous thrombosis) 01/15/2015   Collagen vascular disease    Squamous cell carcinoma of oropharynx 2008     status post chemotherapy and radiation therapy in 2008   Cancer of esophagus 2008    s/p chemo and radiation    Electrolyte and Renal Profile:  Recent Labs Lab 01/31/15 0922 02/01/15 1753 02/02/15 0511  BUN 24* 20 19  CREATININE 1.38* 1.29* 1.14*  NA  --  127* 130*  K  --  4.8 4.5  MG  --  1.0* 1.0*  PHOS  --  4.0  --    Meds: NS at 110 ml/hr, potassium chloride, magnesium sulfate, zofran  Height:  Ht Readings from Last 1 Encounters:  02/01/15 5' (1.524 m)    Weight:  Wt Readings from Last 1 Encounters:  02/01/15 95 lb 10.9 oz (43.4 kg)    Based on last 10 weight encounters, pt with no recent wt loss. Unable to speak with pt on visit today.  Wt Readings  from Last 10 Encounters:  02/01/15 95 lb 10.9 oz (43.4 kg)  10/10/14 94 lb 2.2 oz (42.7 kg)  11/28/14 92 lb 8 oz (41.958 kg)  11/22/14 93 lb (42.185 kg)  11/11/14 93 lb (42.185 kg)  10/14/14 95 lb 8 oz (43.319 kg)  08/18/14 99 lb 8 oz (45.133 kg)  05/23/14 100 lb (45.36 kg)  04/11/14 99 lb 4 oz (45.02 kg)  02/08/14 101 lb 4 oz (45.927 kg)    BMI:  Body mass index is 18.69 kg/(m^2).  Estimated Nutritional Needs:  Kcal:  1210-1321 kcals (BEE 847, 1.3 AF, 1.1-1.2 IF) using current wt  Protein:  43-25 g (1.0-1.2 g/kg)   Fluid:  1290-1505 mL (30-35 mL/kg)   Skin:  Reviewed, no issues  Diet Order:  Diet NPO time specified   Unable to complete Nutrition-Focused physical exam at this time.     Intake/Output Summary (Last 24 hours) at 02/02/15 1422 Last data filed at 02/02/15 1100  Gross per 24 hour  Intake 1840.53 ml  Output    795 ml  Net 1045.53 ml   MODERATE Care Level  Kerman Passey MS, RD, LDN [phone] Pager

## 2015-02-02 NOTE — Transfer of Care (Signed)
Immediate Anesthesia Transfer of Care Note  Patient: Jennifer Simpson  Procedure(s) Performed: Procedure(s): ENDARTERECTOMY FEMORAL (Right) INSERTION OF ILIAC STENT (Bilateral) BYPASS GRAFT FEMORAL-POPLITEAL ARTERY (Left)  Patient Location: PACU and ICU  Anesthesia Type:General  Level of Consciousness: Patient remains intubated per anesthesia plan  Airway & Oxygen Therapy: Patient remains intubated per anesthesia plan and Patient placed on Ventilator (see vital sign flow sheet for setting)  Post-op Assessment: Report given to RN and Post -op Vital signs reviewed and stable  Post vital signs: Reviewed and stable  Last Vitals:  Filed Vitals:   02/02/15 1100  BP: 113/49  Pulse: 71  Temp:   Resp: 29    Complications: No apparent anesthesia complications

## 2015-02-02 NOTE — OR Nursing (Signed)
5000 Heparin in 500 ml NACL irrigation total amount irrigated 521ml

## 2015-02-02 NOTE — Care Management Note (Signed)
Case Management Note  Patient Details  Name: Jennifer Simpson MRN: [id number] Date of Birth: 1940-11-21  Subjective/Objective:   Attempted to see pt and she has been taken back to surgery.                  Action/Plan:   Expected Discharge Date:                  Expected Discharge Plan:     In-House Referral:     Discharge planning Services  CM Consult  Post Acute Care Choice:    Choice offered to:     DME Arranged:    DME Agency:     HH Arranged:    HH Agency:     Status of Service:  In process, will continue to follow  Medicare Important Message Given:  Yes Date Medicare IM Given:  02/02/15 Medicare IM give by:  Orvan July Date Additional Medicare IM Given:    Additional Medicare Important Message give by:     If discussed at [place] of Stay Meetings, dates discussed:    Additional Comments:  Jolly Mango, RN 02/02/2015, 2:40 PM

## 2015-02-02 NOTE — Progress Notes (Signed)
Upon initial assessment, pulses noted absent in both right and left legs.  Left groin intact with staples Dr schnier notified and CT of bilateral legs ordered.  At 11am pt was taken for emergency surgery.  Consents signed and family informed.

## 2015-02-02 NOTE — Op Note (Signed)
OPERATIVE NOTE   PROCEDURE: 1. Right common femoral endarterectomy with core matrix patch angioplasty 2. Right profunda femoris eversion endarterectomy 3. Right superficial femoral endarterectomy with core matrix patch angioplasty 4. Bilateral common iliac artery stenting using 8 mm Viabahn stents 5. Bilateral external iliac artery stenting using 7 mm Viabahn stents 6. 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 ASSISTANT(S): Dr. Leotis Pain; Ms. Hezzie Bump  ANESTHESIA: general  ESTIMATED BLOOD LOSS: 125 cc  FINDING(S): 1.  High-grade occlusive disease within the right common femoral superficial femoral and profunda femoris, high-grade in-stent restenosis bilateral common and external iliac arteries, occlusion of the left SFA with high-grade lesions noted within the proximal two thirds of the popliteal  SPECIMEN(S):  Plaque from the common femoral superficial femoral and profunda femoris on the right.  INDICATIONS:   Jennifer Simpson is a 74 y.o. female who presents with bilateral ischemic rest pain. She rates the pain at 10 out of 10. She is status post left iliac angioplasty with endarterectomy of the left common femoral and profunda femoris yesterday. Her symptoms are not improved in fact now her right leg is becoming her more problematic leg there are no audible Doppler signals bilaterally. CT angiogram is performed which demonstrates the left iliac is patent however there remains a greater than 60% in-stent restenosis at 2 locations  there are significant distal aortic disease there is profound right sided disease the right common femoral is essentially occluded although there is trickle flow on the left the common femoral endarterectomy and profunda femoris endarterectomy are widely patent however there does not appear to be any improvement in her distal perfusion. Given these findings and the severe pain that she is having with lack of Doppler signal I elected to move forward with reconstruction of the right as planned and bypass of the left as she has patent inflow when viewed on CT. Given the in-stent restenosis which did not respond angioplasty alone on the left and the severe in-stent restenosis noted on the right I will plan for restenting at the time of surgery. The risks and benefits were discussed with the patient all questions were answered. The patient wishes for everything to be done to save her legs and get her out of pain.  DESCRIPTION: After obtaining full informed written consent, the patient was brought back to the operating room and placed supine upon the operating table.  The patient received IV antibiotics prior to induction.  After obtaining adequate anesthesia, the patient was prepped and draped in the standard fashion appropriate time out is called.    Vertical incision is then made in the right groin and the dissection is carried down to expose the femoral sheath which is then opened. The femoral artery is then dissected circumferentially extending the incision proximally 2-3 cm above the ileo-inguinal ligament well above the circumflex vessels. Dissection is then extended 3-4 cm down the SFA. Profunda femoris is dissected down to its first major division. All these structures and then looped with Silastic vessel loops. 4000 units of heparin was given and allowed circulate for 4 minutes.  A linear incision is created in the common femoral artery  and extended up to the level of the circumflex vessels is then  extended down several centimeters into the superficial femoral artery. There is an extreme amount of bulky plaque noted which is very reminiscent of a coral reef with multiple lumps and spherical calcified portions. Endarterectomy is then performed with a Soil scientist and the endarterectomy is carried up to the clamp proximally and then down into the SFA. The profunda femoris on the right is treated with the eversion technique. A total of 5 interrupted 7-0 Prolene sutures are used to tack the intimal edge within the profunda femoris and a total of 4 7-0 Prolene sutures are used to tack the intimal edge within the SFA. Excellent back bleeding is noted from the profunda and some back bleeding is noted from the SFA. Excellent forward flow was noted. A core matrix patch is then rehydrated on the back table and applied to repair the arterial defect using running 6-0 Prolene. Flushing maneuvers were performed the endarterectomy site is irrigated copiously and then the suture line is completed. Flow was reestablished first to the profunda femoris and then the superficial femoral artery.  The left femoral incision is then reopened removing the staples and removing all the Vicryl suture. The wound is irrigated with sterile saline. With Dr. Lucky Cowboy working on the left side and myself working on the right Seldinger needles were inserted through the patch and the mid common femoral and wires were advanced up into the aorta under fluoroscopic guidance. 8 French sheaths were then inserted. An additional 2000 units of heparin was given. Imaging was obtained by hand injection demonstrating the distal aorta. The in-stent restenosis on the left was verified the previously documented multiple stenoses on the right were also reverified. Beginning with 2 8 mm x 100 mm Viabahn stents there are advanced over the wires up into the aorta extending the bifurcation up proximally 2-3 centimeters. Although the markers were equal in position  after the deployment the right side seemed to have retracted somewhat and an additional 8 x 25 mm Viabahn was added so that the origins of the stents were exactly the same within the distal aorta. They were then postdilated with an 8 x 100 Dorado balloon. 30 atm of pressure was needed to fully expand the left stent. The atmospheres were matched right and left to allow for full expansion equally. Follow-up imaging first on the left than the right demonstrated persistent lesions within the external and therefore a 7 x 50 Viabahn stent was placed in the left external and a 7 x 100 Viabahn stent was placed in the right both were fully expanded using a 7 x 100 Dorado balloon again high pressure was needed to fully expand the stents. Follow-up imaging from the distal aorta to the femorals now demonstrates wide patency of the distal aorta bilateral common and external iliacs with excellent result of the common femoral and distal femoral endarterectomy sites. The sheaths were then removed and a pursestring of 6-0 Prolene was used to close the puncture site. Saline moistened lap was packed on the right and attention was then turned to the in situ bypass on the left.  The saphenofemoral junction was then skeletonized and the saphenous vein exposed through several latter incisions. Tributaries were ligated and divided with 3-0 silk or clipped with surgical clips. Distally the fasciotomy incision from the previous night was used to identify the distal saphenous vein which was then dissected circumferentially from the knee to the distal fasciotomy site. Again branches  were ligated with 3-0 silk and or surgical clips. The vein was then marked proximally and distally.  The left superficial femoral artery was then clamped right at the origin to avoid clamping the patch and dissected for approximately 2 cm down more distally and then looped there. Arteriotomy made was made in the SFA as the patient has a very high bifurcation  and the saphenous vein would not reach the common femoral artery in an in situ fashion. A small amount of plaque was residual and somewhat more distal than last night was removed widely patent portion of the SFA is now available for proximal anastomosis and 6-0 Prolene stay sutures were placed. The SFA was then taken down after Satinsky clamp was placed and a 6-0 Prolene suture was used to close the venotomy in an over and under followed by a running closure. The most proximal valve in the saphenous and was lysed under direct visualization the SFA was then matched to the venotomy at the end of the saphenous vein and an end vein to side SFA anastomosis was fashioned using running 6-0 Prolene suture. The vein was then pressurized. Distally the vein was ligated with silk tie transected and subsequently a urcile valvulotome was advanced from the distal vein proximal and the valves were lysed in the usual fashion.  It should be noted that prior to creating the proximal anastomosis the popliteal space was entered from the medial fasciotomy incision and the below-knee popliteal artery was identified and then dissected circumferentially for a distance of approximately 2 cm and looped with Silastic Vesseloops.  With the vein marked and the leg now straightened the vein was approximated to the popliteal artery was then marked for appropriate length trimmed and spatulated. Popliteal arteriotomy was made with 11 blade and extended with Potts scissors 6-0 Prolene stay sutures were placed and an end vein to side popliteal artery below the knee anastomosis was fashioned using running 6-0 Prolene. Flushing maneuvers were performed and flow was established versed in a retrograde fashion and then in an antegrade fashion. Excellent pulses noted distally to the anastomosis. Excellent pulses noted in the graft. The bypass is then interrogated with a Doppler and found to have 1 small branch which was clipped distally there was  excellent flow.  All wounds were then irrigated with sterile saline Surgicel and AdvaSeal were placed in the femoral wounds and these were both then closed in approximate 5 layers plus skin staples. The linear incisions in the left leg were then closed with 3-0 Vicryl followed by surgical staples. The lateral fasciotomy site was dressed with Xeroform and a sterile dressing. Honeycomb dressings were placed on all incisions.  The patient tolerated procedure without hemodynamic instability. Postoperatively she exhibited very shallow tidal volumes and it is therefore decided to rest her overnight on the ventilator and she is returned to the intensive care unit in stable condition on the ventilator otherwise hemodynamically stable.  COMPLICATIONS: None  CONDITION: Critical  Katha Cabal, M.D. [facility] Office: [phone]   02/02/2015, 8:36 PM

## 2015-02-02 NOTE — Anesthesia Preprocedure Evaluation (Addendum)
Anesthesia Evaluation  Patient identified by MRN, date of birth, ID band  Reviewed: Allergy & Precautions, NPO status , Patient's Chart, lab work & pertinent test results  History of Anesthesia Complications Negative for: history of anesthetic complications  Airway Mallampati: II  TM Distance: >3 FB Neck ROM: Full    Dental  (+) Partial Lower, Missing   Pulmonary asthma , COPD COPD inhaler, Current Smoker,  + rhonchi   + decreased breath sounds      Cardiovascular hypertension, Pt. on medications + Peripheral Vascular Disease Normal cardiovascular exam    Neuro/Psych Anxiety negative neurological ROS     GI/Hepatic Neg liver ROS, PUD, GERD-  Medicated,  Endo/Other  negative endocrine ROS  Renal/GU Renal hypertensionRenal disease  negative genitourinary   Musculoskeletal negative musculoskeletal ROS (+)   Abdominal Normal abdominal exam  (+)   Peds negative pediatric ROS (+)  Hematology negative hematology ROS (+)   Anesthesia Other Findings Pt with extreme pain in RLE.  Reproductive/Obstetrics negative OB ROS                            Anesthesia Physical  Anesthesia Plan  ASA: III and emergent  Anesthesia Plan: General   Post-op Pain Management:    Induction: Intravenous  Airway Management Planned: Oral ETT  Additional Equipment: Arterial line  Intra-op Plan:   Post-operative Plan: Extubation in OR  Informed Consent: I have reviewed the patients History and Physical, chart, labs and discussed the procedure including the risks, benefits and alternatives for the proposed anesthesia with the patient or authorized representative who has indicated his/her understanding and acceptance.     Plan Discussed with: CRNA  Anesthesia Plan Comments:        Anesthesia Quick Evaluation

## 2015-02-02 NOTE — Plan of Care (Signed)
Problem: Phase I Progression Outcomes Goal: Distal pulses equal to baseline Outcome: Not Progressing Pulses absent from left dorsalis pedis and left posterior tibial; Dr. Delana Meyer aware.  Will continue to monitor.

## 2015-02-02 NOTE — Anesthesia Procedure Notes (Signed)
Procedure Name: Intubation Performed by: Jashon Ishida Pre-anesthesia Checklist: Patient identified, Emergency Drugs available, Suction available, Patient being monitored and Timeout performed Patient Re-evaluated:Patient Re-evaluated prior to inductionOxygen Delivery Method: Circle system utilized Preoxygenation: Pre-oxygenation with 100% oxygen Intubation Type: IV induction Ventilation: Mask ventilation without difficulty Laryngoscope Size: Mac and 3 Grade View: Grade II Tube type: Oral Tube size: 7.0 mm Number of attempts: 1 Airway Equipment and Method: Stylet Placement Confirmation: ETT inserted through vocal cords under direct vision,  positive ETCO2 and breath sounds checked- equal and bilateral Secured at: 20 cm Tube secured with: Tape Dental Injury: Teeth and Oropharynx as per pre-operative assessment

## 2015-02-02 NOTE — OR Nursing (Signed)
4000 Heparin given by anesthesia 1430

## 2015-02-03 ENCOUNTER — Encounter: Payer: Self-pay | Admitting: Vascular Surgery

## 2015-02-03 DIAGNOSIS — E872 Acidosis: Secondary | ICD-10-CM

## 2015-02-03 DIAGNOSIS — J9600 Acute respiratory failure, unspecified whether with hypoxia or hypercapnia: Secondary | ICD-10-CM

## 2015-02-03 LAB — BASIC METABOLIC PANEL
Anion gap: 5 (ref 5–15)
BUN: 18 mg/dL (ref 6–20)
CO2: 20 mmol/L — ABNORMAL LOW (ref 22–32)
Calcium: 6.6 mg/dL — ABNORMAL LOW (ref 8.9–10.3)
Chloride: 106 mmol/L (ref 101–111)
Creatinine, Ser: 1.22 mg/dL — ABNORMAL HIGH (ref 0.44–1.00)
GFR calc Af Amer: 49 mL/min — ABNORMAL LOW (ref >60)
GFR calc non Af Amer: 42 mL/min — ABNORMAL LOW (ref >60)
Glucose, Bld: 110 mg/dL — ABNORMAL HIGH (ref 65–99)
POTASSIUM: 3.7 mmol/L (ref 3.5–5.1)
Sodium: 131 mmol/L — ABNORMAL LOW (ref 135–145)

## 2015-02-03 LAB — BLOOD GAS, ARTERIAL
ACID-BASE DEFICIT: 6 mmol/L — AB (ref 0.0–2.0)
ALLENS TEST (PASS/FAIL): POSITIVE — AB
Acid-base deficit: 14.6 mmol/L — ABNORMAL HIGH (ref 0.0–2.0)
Allens test (pass/fail): POSITIVE — AB
BICARBONATE: 14.9 meq/L — AB (ref 21.0–28.0)
Bicarbonate: 17.1 mEq/L — ABNORMAL LOW (ref 21.0–28.0)
FIO2: 0.4 %
FIO2: 0.5 %
MECHVT: 450 mL
MECHVT: 450 mL
O2 SAT: 99.7 %
O2 Saturation: 85.2 %
PATIENT TEMPERATURE: 37
PCO2 ART: 27 mmHg — AB (ref 32.0–48.0)
PEEP: 5 cmH2O
PEEP: 5 cmH2O
PH ART: 7.1 — AB (ref 7.350–7.450)
PO2 ART: 69 mmHg — AB (ref 83.0–108.0)
PO2 ART: 208 mmHg — AB (ref 83.0–108.0)
PRESSURE SUPPORT: 20 cmH2O
Patient temperature: 37
RATE: 8 resp/min
RATE: 18 resp/min
pCO2 arterial: 48 mmHg (ref 32.0–48.0)
pH, Arterial: 7.41 (ref 7.350–7.450)

## 2015-02-03 LAB — CBC
HCT: 27.4 % — ABNORMAL LOW (ref 35.0–47.0)
HCT: 27.6 % — ABNORMAL LOW (ref 35.0–47.0)
Hemoglobin: 8.9 g/dL — ABNORMAL LOW (ref 12.0–16.0)
Hemoglobin: 9 g/dL — ABNORMAL LOW (ref 12.0–16.0)
MCH: 33 pg (ref 26.0–34.0)
MCH: 33.4 pg (ref 26.0–34.0)
MCHC: 32.5 g/dL (ref 32.0–36.0)
MCHC: 32.7 g/dL (ref 32.0–36.0)
MCV: 101.6 fL — ABNORMAL HIGH (ref 80.0–100.0)
MCV: 102.2 fL — AB (ref 80.0–100.0)
Platelets: 156 10*3/uL (ref 150–440)
Platelets: 161 10*3/uL (ref 150–440)
RBC: 2.7 MIL/uL — ABNORMAL LOW (ref 3.80–5.20)
RBC: 2.7 MIL/uL — AB (ref 3.80–5.20)
RDW: 21.2 % — ABNORMAL HIGH (ref 11.5–14.5)
RDW: 21.1 % — ABNORMAL HIGH (ref 11.5–14.5)
WBC: 4.3 10*3/uL (ref 3.6–11.0)
WBC: 4.8 10*3/uL (ref 3.6–11.0)

## 2015-02-03 LAB — TYPE AND SCREEN
ABO/RH(D): A NEG
Antibody Screen: NEGATIVE
UNIT DIVISION: 0
Unit division: 0

## 2015-02-03 LAB — PROTIME-INR
INR: 1.82
Prothrombin Time: 21.2 seconds — ABNORMAL HIGH (ref 11.4–15.0)

## 2015-02-03 LAB — LACTIC ACID, PLASMA: Lactic Acid, Venous: 1.4 mmol/L (ref 0.5–2.0)

## 2015-02-03 LAB — SURGICAL PATHOLOGY

## 2015-02-03 LAB — APTT: aPTT: >160 seconds (ref 24–36)

## 2015-02-03 LAB — MAGNESIUM: MAGNESIUM: 2.1 mg/dL (ref 1.7–2.4)

## 2015-02-03 LAB — FIBRINOGEN: Fibrinogen: 352 mg/dL (ref 210–470)

## 2015-02-03 MED ORDER — HEPARIN (PORCINE) IN NACL 100-0.45 UNIT/ML-% IJ SOLN
400.0000 [IU]/h | INTRAMUSCULAR | Status: DC
  Administered 2015-02-03 – 2015-02-08 (×3): 400 [IU]/h via INTRAVENOUS
  Filled 2015-02-03 (×5): qty 250

## 2015-02-03 MED ORDER — SODIUM CHLORIDE 0.9 % IV BOLUS (SEPSIS)
500.0000 mL | Freq: Once | INTRAVENOUS | Status: AC
  Administered 2015-02-03: 500 mL via INTRAVENOUS

## 2015-02-03 MED ORDER — SODIUM CHLORIDE 0.9 % IV SOLN
2.0000 g | Freq: Once | INTRAVENOUS | Status: AC
  Administered 2015-02-03: 2 g via INTRAVENOUS
  Filled 2015-02-03: qty 20

## 2015-02-03 MED ORDER — LORAZEPAM 2 MG/ML IJ SOLN
2.0000 mg | INTRAMUSCULAR | Status: DC | PRN
  Administered 2015-02-03: 1 mg via INTRAVENOUS
  Administered 2015-02-04: 2 mg via INTRAVENOUS
  Administered 2015-02-05: 1 mg via INTRAVENOUS
  Administered 2015-02-06 – 2015-02-07 (×2): 2 mg via INTRAVENOUS
  Filled 2015-02-03 (×5): qty 1

## 2015-02-03 MED ORDER — MUPIROCIN 2 % EX OINT
TOPICAL_OINTMENT | Freq: Three times a day (TID) | CUTANEOUS | Status: DC
  Filled 2015-02-03: qty 22

## 2015-02-03 MED ORDER — MUPIROCIN 2 % EX OINT
TOPICAL_OINTMENT | Freq: Two times a day (BID) | CUTANEOUS | Status: DC
  Administered 2015-02-03: 14:00:00 via TOPICAL
  Administered 2015-02-03: 1 via TOPICAL
  Administered 2015-02-04 – 2015-02-06 (×4): via TOPICAL
  Administered 2015-02-07: 1 via TOPICAL
  Administered 2015-02-07 – 2015-02-09 (×4): via TOPICAL
  Filled 2015-02-03: qty 22

## 2015-02-03 MED ORDER — SODIUM CHLORIDE 0.9 % IV BOLUS (SEPSIS)
500.0000 mL | Freq: Once | INTRAVENOUS | Status: AC
Start: 2015-02-03 — End: 2015-02-03
  Administered 2015-02-03: 500 mL via INTRAVENOUS

## 2015-02-03 MED ORDER — MUPIROCIN CALCIUM 2 % NA OINT
TOPICAL_OINTMENT | Freq: Three times a day (TID) | NASAL | Status: DC
  Filled 2015-02-03 (×18): qty 1

## 2015-02-03 MED ORDER — ACETAMINOPHEN 325 MG RE SUPP
325.0000 mg | RECTAL | Status: DC | PRN
  Filled 2015-02-03: qty 2

## 2015-02-03 MED ORDER — ACETAMINOPHEN 325 MG PO TABS: 325.0000 mg | ORAL_TABLET | ORAL | Status: DC | PRN

## 2015-02-03 MED ORDER — NICOTINE 14 MG/24HR TD PT24
14.0000 mg | MEDICATED_PATCH | Freq: Every day | TRANSDERMAL | Status: DC
  Administered 2015-02-03 – 2015-02-08 (×6): 14 mg via TRANSDERMAL
  Filled 2015-02-03 (×6): qty 1

## 2015-02-03 MED ORDER — OXYCODONE HCL 5 MG PO TABS
5.0000 mg | ORAL_TABLET | ORAL | Status: DC | PRN
  Administered 2015-02-03 (×2): 5 mg via ORAL
  Administered 2015-02-04: 10 mg via ORAL
  Filled 2015-02-03 (×2): qty 1
  Filled 2015-02-03: qty 2

## 2015-02-03 MED ORDER — ENOXAPARIN SODIUM 30 MG/0.3ML ~~LOC~~ SOLN
30.0000 mg | SUBCUTANEOUS | Status: DC
  Administered 2015-02-03: 30 mg via SUBCUTANEOUS
  Filled 2015-02-03: qty 0.3

## 2015-02-03 NOTE — Care Management Note (Addendum)
Case Management Note  Patient Details  Name: Jennifer Simpson MRN: [id number] Date of Birth: [birth year]  Subjective/Objective:    Admitted with right femoral endarterectomy. Post op pt was absent of pulses. Returned to  surgery for extensive repair of peripheral artery disease.  Post op course complicated by respiratory failure and metabolic acidosis. Intubated. Off pressor and sedation. Will monitor progression           Action/Plan:   Expected Discharge Date:                  Expected Discharge Plan:     In-House Referral:     Discharge planning Services  CM Consult  Post Acute Care Choice:    Choice offered to:     DME Arranged:    DME Agency:     HH Arranged:    HH Agency:     Status of Service:  In process, will continue to follow  Medicare Important Message Given:  Yes Date Medicare IM Given:  02/02/15 Medicare IM give by:  Orvan July Date Additional Medicare IM Given:    Additional Medicare Important Message give by:     If discussed at [place] of Stay Meetings, dates discussed:    Additional Comments:  Jolly Mango, RN 02/03/2015, 8:52 AM

## 2015-02-03 NOTE — Progress Notes (Signed)
Called dr dew about pt output also gave him update on pt received orders for fluid bolus and increased fluid rate Tamsen Snider, RN

## 2015-02-03 NOTE — Consult Note (Signed)
PULMONARY / CRITICAL CARE MEDICINE   Name: Jennifer Simpson MRN: [id number] DOB: [birth year]    ADMISSION DATE:  02/01/2015    CHIEF COMPLAINT:   Acute resp failure   HISTORY OF PRESENT ILLNESS   74 yo white female admitted to ICU for post op resp failure from severe metabolic acidosis Patient intubated, offsedation, off vasopressors. Patient started on bicarb infusion last night  Will plan for SAT/SBt today  Patient with severe PAD s/p extensive vasc surgery repair    SIGNIFICANT EVENTS   1. Right common femoral endarterectomy with core matrix patch angioplasty 2. Right profunda femoris eversion endarterectomy 3. Right superficial femoral endarterectomy with core matrix patch angioplasty 4. Bilateral common iliac artery stenting using 8 mm Viabahn stents 5. Bilateral external iliac artery stenting using 7 mm Viabahn stents 6. Left femoral to below knee popliteal bypass using in situ great saphenous vein    PAST MEDICAL HISTORY    :  Past Medical History  Diagnosis Date  . Asthma   . Peripheral vascular disease     a. h/o iliac stents 2010. b. RAS as below.  . History of skin ulcer   . Hypertension   . GERD (gastroesophageal reflux disease)   . History of stomach ulcers   . Hyponatremia     Previously related to HCTZ/chlorthalidone  . Clotting disorder     left leg  . Chronic kidney disease   . Erythrocytosis 01/15/2015  . Lower leg DVT (deep venous thrombosis) 01/15/2015  . Collagen vascular disease   . Squamous cell carcinoma of oropharynx 2008     status post chemotherapy and radiation therapy in 2008  . Cancer of esophagus 2008    s/p chemo and radiation   Past Surgical History  Procedure Laterality Date  . Peripheral arterial stent graft Bilateral     x2 STENTS BOTH LEGS  . Anterior cervical decomp/discectomy fusion  ~ 2006    "put in spacer" (12/02/2012)  . Portacath placement  2008  . Anterior cervical decomp/discectomy fusion  10/07/2012     Procedure: ANTERIOR CERVICAL DECOMPRESSION/DISCECTOMY FUSION 2 LEVEL/HARDWARE REMOVAL;  Surgeon: Ophelia Charter, MD;  Location: [facility];  Service: Neurosurgery;  Laterality: N/A;  Cervical three-four, cervical four-five Anterior cervical decompression/diskectomy/fusion/interbody plating and prosthesis/removal of spinal concept plate  . Cataract extraction w/ intraocular lens  implant, bilateral  ~2005  . Renal artery stent Right 12/02/2012    Successful angioplasty and bare-metal stent placement to the ostial right renal artery  . Tonsillectomy  ~ 1946  . Breast cyst excision Right ?1980's    negative  . Renal angiogram N/A 12/02/2012    Procedure: RENAL ANGIOGRAM;  Surgeon: Wellington Hampshire, MD;  Location: [facility];  Service: Cardiovascular;  Laterality: N/A;  . Peripheral vascular catheterization Right 01/31/2015    Procedure: Lower Extremity Angiography;  Surgeon: Katha Cabal, MD;  Location: [place] [facility];  Service: Cardiovascular;  Laterality: Right;  . Peripheral vascular catheterization  01/31/2015    Procedure: Lower Extremity Intervention;  Surgeon: Katha Cabal, MD;  Location: [place] [facility];  Service: Cardiovascular;;  . Endarterectomy femoral N/A 02/01/2015    Procedure: ENDARTERECTOMY FEMORAL;  Surgeon: Katha Cabal, MD;  Location: [facility];  Service: Vascular;  Laterality: N/A;  . Insertion of iliac stent Left 02/01/2015    Procedure: INSERTION OF ILIAC STENT;  Surgeon: Katha Cabal, MD;  Location: [facility];  Service: Vascular;  Laterality: Left;   Prior to Admission  medications   Medication Sig Start Date End Date Taking? Authorizing Provider  acetaminophen (TYLENOL) 500 MG tablet Take 1,000 mg by mouth every 6 (six) hours as needed for pain.   Yes Historical Provider, MD  diltiazem (CARDIZEM CD) 240 MG 24 hr capsule Take 1 capsule (240 mg total) by mouth daily. 11/23/14  Yes Wellington Hampshire, MD  fluticasone (FLONASE) 50 MCG/ACT nasal spray Place  2 sprays into the nose daily.  06/08/13  Yes Historical Provider, MD  hydrALAZINE (APRESOLINE) 10 MG tablet Take 1 tablet (10 mg total) by mouth 3 (three) times daily. 12/09/14  Yes Ryan M Dunn, PA-C  losartan (COZAAR) 100 MG tablet Take 1 tablet (100 mg total) by mouth daily. 06/13/14  Yes Wellington Hampshire, MD  mupirocin ointment (BACTROBAN) 2 %  01/05/15  Yes Historical Provider, MD  pantoprazole (PROTONIX) 40 MG tablet daily. 10/28/14  Yes Historical Provider, MD  promethazine (PHENERGAN) 12.5 MG tablet Take 1 tablet (12.5 mg total) by mouth every 8 (eight) hours as needed for nausea or vomiting. 12/20/14  Yes Jearld Fenton, NP  Ranitidine HCl (ZANTAC PO) Take by mouth daily.   Yes Historical Provider, MD  spironolactone (ALDACTONE) 25 MG tablet Take 1 tablet (25 mg total) by mouth daily. 10/14/14  Yes Wellington Hampshire, MD  warfarin (COUMADIN) 2.5 MG tablet Take 2.5 mg by mouth daily. Mon, Wed, Fri, and Sat   Yes Historical Provider, MD  albuterol-ipratropium (COMBIVENT) 18-103 MCG/ACT inhaler Inhale 2 puffs into the lungs every 6 (six) hours as needed.    Historical Provider, MD  calcium carbonate (TUMS - DOSED IN MG ELEMENTAL CALCIUM) 500 MG chewable tablet Chew 1 tablet by mouth 2 (two) times daily.    Historical Provider, MD  cyclobenzaprine (FLEXERIL) 5 MG tablet Take 1 tablet (5 mg total) by mouth 2 (two) times daily as needed for muscle spasms (sedation caution). Patient not taking: Reported on 12/26/2014 12/09/14   Tonia Ghent, MD  multivitamin-iron-minerals-folic acid (CENTRUM) chewable tablet Chew 1 tablet by mouth daily.    Historical Provider, MD   Allergies  Allergen Reactions  . Trimethobenzamide Other (See Comments)    Eye allergy, whites of eyes turned red, for about two weeks, there after.    Marland Kitchen Amoxicillin-Pot Clavulanate Nausea And Vomiting    vomiting after 45 min of taking pill  . Ceftin [Cefuroxime Axetil] Other (See Comments)    Unknown   . Chlorthalidone     hyponatremia   . Hctz [Hydrochlorothiazide]     Hyponatremia   . Polymyxin B-Trimethoprim Itching    eyes red  itching  . Tequin [Gatifloxacin] Itching     FAMILY HISTORY   Family History  Problem Relation Age of Onset  . Diabetes Sister   . Cancer Neg Hx   . Stroke Neg Hx       SOCIAL HISTORY    reports that she has been smoking Cigarettes.  She has a 12.5 pack-year smoking history. She has never used smokeless tobacco. She reports that she drinks about 3.0 oz of alcohol per week. She reports that she does not use illicit drugs.  Review of Systems  Unable to perform ROS: critical illness      VITAL SIGNS    Temp:  [95.5 F (35.3 C)-99.3 F (37.4 C)] 99.1 F (37.3 C) (06/03 0800) Pulse Rate:  [31-90] 67 (06/03 0800) Resp:  [12-29] 18 (06/03 0800) BP: (87-113)/(42-65) 90/42 mmHg (06/03 0800) SpO2:  [94 %-100 %] 100 % (  06/03 0800) Arterial Line BP: (82-157)/(30-56) 121/30 mmHg (06/03 0800) FiO2 (%):  [40 %-50 %] 50 % (06/03 0404) HEMODYNAMICS:   VENTILATOR SETTINGS: Vent Mode:  [-] PRVC FiO2 (%):  [40 %-50 %] 50 % Set Rate:  [8 bmp-18 bmp] 18 bmp Vt Set:  [450 mL] 450 mL PEEP:  [5 cmH20] 5 cmH20 INTAKE / OUTPUT:  Intake/Output Summary (Last 24 hours) at 02/03/15 0835 Last data filed at 02/03/15 0800  Gross per 24 hour  Intake 6592.49 ml  Output    825 ml  Net 5767.49 ml       PHYSICAL EXAM   Physical Exam  Constitutional: No distress.  HENT:  Head: Normocephalic and atraumatic.  Eyes: Pupils are equal, round, and reactive to light. No scleral icterus.  Neck: Normal range of motion. Neck supple.  Cardiovascular: Normal rate and regular rhythm.   No murmur heard. Pulmonary/Chest: No respiratory distress. She has no wheezes. She has no rales.  resp distress  Abdominal: Soft. She exhibits no distension. There is no tenderness.  Musculoskeletal: She exhibits no edema.  Neurological: She displays normal reflexes. Coordination normal.  gcs<10T  Skin: Skin is  warm. No rash noted. She is not diaphoretic.       LABS   LABS:  CBC  Recent Labs Lab 02/02/15 0511 02/02/15 2258 02/03/15 0459  WBC 9.2 8.8 4.3  HGB 11.3* 10.5* 8.9*  HCT 33.9* 32.8* 27.4*  PLT 199 186 156   Coag's  Recent Labs Lab 01/31/15 0922 02/01/15 1753 02/02/15 2258  APTT  --  33 >160*  INR 1.15 1.37 1.82   BMET  Recent Labs Lab 02/02/15 0511 02/02/15 2258 02/03/15 0459  NA 130* 129* 131*  K 4.5 4.6 3.7  CL 104 103 106  CO2 21* 18* 20*  BUN 19 18 18   CREATININE 1.14* 1.23* 1.22*  GLUCOSE 111* 140* 110*   Electrolytes  Recent Labs Lab 02/01/15 1753 02/02/15 0511 02/02/15 2258 02/03/15 0459  CALCIUM 9.0 7.7* 7.3* 6.6*  MG 1.0* 1.0*  --   --   PHOS 4.0  --   --   --    Sepsis Markers  Recent Labs Lab 02/02/15 2020 02/03/15 0159  LATICACIDVEN 2.6* 1.4   ABG  Recent Labs Lab 02/02/15 2050 02/03/15 0413  PHART 7.10* 7.41  PCO2ART 48 27*  PO2ART 69* 208*   Liver Enzymes  Recent Labs Lab 02/02/15 2258  AST 150*  ALT 50  ALKPHOS 58  BILITOT 0.8  ALBUMIN 2.6*   Cardiac Enzymes No results for input(s): TROPONINI, PROBNP in the last 168 hours. Glucose  Recent Labs Lab 02/02/15 2008  GLUCAP 156*     No results found for this or any previous visit (from the past 240 hour(s)).   Current facility-administered medications:  .  0.9 %  sodium chloride infusion, , Intravenous, Continuous, Katha Cabal, MD, Last Rate: 110 mL/hr at 02/03/15 0059 .  acetaminophen (TYLENOL) tablet 325-650 mg, 325-650 mg, Oral, Q4H PRN **OR** acetaminophen (TYLENOL) suppository 325-650 mg, 325-650 mg, Rectal, Q4H PRN, Katha Cabal, MD .  antiseptic oral rinse (CPC / CETYLPYRIDINIUM CHLORIDE 0.05%) solution 7 mL, 7 mL, Mouth Rinse, QID, Flora Lipps, MD, 7 mL at 02/03/15 0400 .  chlorhexidine (PERIDEX) 0.12 % solution 15 mL, 15 mL, Mouth Rinse, BID, Flora Lipps, MD, 15 mL at 02/02/15 2254 .  clindamycin (CLEOCIN) IVPB 300 mg, 300 mg,  Intravenous, On Call to OR, Katha Cabal, MD, 300 mg at 02/03/15 0535 .  famotidine (PEPCID) IVPB 20 mg premix, 20 mg, Intravenous, Q0600, Flora Lipps, MD, 20 mg at 02/03/15 0535 .  fentaNYL (SUBLIMAZE) injection 100 mcg, 100 mcg, Intravenous, Q1H PRN, Flora Lipps, MD, 100 mcg at 02/03/15 0729 .  hydrALAZINE (APRESOLINE) injection 5 mg, 5 mg, Intravenous, Q20 Min PRN, Katha Cabal, MD .  HYDROmorphone (DILAUDID) injection 0.5 mg, 0.5 mg, Intravenous, Q2H PRN, Katha Cabal, MD .  ipratropium-albuterol (DUONEB) 0.5-2.5 (3) MG/3ML nebulizer solution 3 mL, 3 mL, Inhalation, Q6H PRN, Katha Cabal, MD .  labetalol (NORMODYNE,TRANDATE) injection 10 mg, 10 mg, Intravenous, Q10 min PRN, Katha Cabal, MD, 10 mg at 02/03/15 0160 .  midazolam (VERSED) injection 4 mg, 4 mg, Intravenous, Q1H PRN, Flora Lipps, MD, 4 mg at 02/03/15 0208 .  ondansetron (ZOFRAN) injection 4 mg, 4 mg, Intravenous, Q6H PRN, Katha Cabal, MD, 4 mg at 02/02/15 1851 .  polyethylene glycol (MIRALAX / GLYCOLAX) packet 17 g, 17 g, Oral, Daily PRN, Katha Cabal, MD .  potassium chloride SA (K-DUR,KLOR-CON) CR tablet 20-40 mEq, 20-40 mEq, Oral, Daily PRN, Katha Cabal, MD .  sodium bicarbonate 150 mEq in sterile water 1,000 mL infusion, , Intravenous, Continuous, Flora Lipps, MD, Last Rate: 125 mL/hr at 02/03/15 1093  Facility-Administered Medications Ordered in Other Encounters:  .  0.9 %  sodium chloride infusion, , , Continuous PRN, Denise Justis, CRNA, Last Rate: 110 mL/hr at 02/02/15 2040 .  fentaNYL (SUBLIMAZE) injection, , , Anesthesia Intra-op, Denise Justis, CRNA, 50 mcg at 02/02/15 1637 .  glycopyrrolate (ROBINUL) injection, , , Anesthesia Intra-op, Denise Justis, CRNA, 0.4 mg at 02/01/15 2113 .  heparin injection, , , Anesthesia Intra-op, Denise Justis, CRNA, 2 mL at 02/02/15 1814 .  neostigmine (BLOXIVERZ) injection, , Intravenous, Anesthesia Intra-op, Denise Justis, CRNA, 3 mg at 02/01/15  2113 .  rocuronium (ZEMURON) injection, , , Anesthesia Intra-op, Denise Justis, CRNA, 50 mg at 02/02/15 1325 .  vancomycin (VANCOCIN) 1,000 mg in sodium chloride 0.9 % 250 mL IVPB, 1,000 mg, Intravenous, Continuous PRN, Langley Gauss Justis, CRNA, 1,000 mg at 02/01/15 2159  IMAGING    Ct Angio Ao+bifem W/cm &/or Wo/cm  02/02/2015   CLINICAL DATA:  Acute, critical ischemia of the left lower extremity and status post vascular surgical procedure yesterday consisting of left common femoral endarterectomy with patch angioplasty, left profunda femoral endarterectomy with patch angioplasty, thrombectomy of left-sided iliac arteries and angioplasty of the left common iliac artery. Left calf fasciotomies were also performed.  EXAM: CT ANGIOGRAPHY OF ABDOMINAL AORTA WITH ILIOFEMORAL RUNOFF  TECHNIQUE: Multidetector CT imaging of the abdomen, pelvis and lower extremities was performed using the standard protocol during bolus administration of intravenous contrast. Multiplanar CT image reconstructions and MIPs were obtained to evaluate the vascular anatomy.  CONTRAST:  141mL OMNIPAQUE IOHEXOL 350 MG/ML SOLN  COMPARISON:  None.  FINDINGS: Aorta: The abdominal aorta shows diffuse heavily calcified plaque. No aneurysmal disease is present. The celiac axis shows normal patency. The origin of the superior mesenteric artery is occluded by heavily calcified plaque. There is reconstitution of the proximal trunk by pancreaticoduodenal collateral vessels. The inferior mesenteric artery is open.  A proximal right renal artery stent is visualized and is open. Heavily calcified plaque present at the origin of the left renal artery is likely causing significant stenosis. The distal left renal artery is open. On reformatted imaging, size of the left kidney appears significantly smaller than the right, implicating chronic ischemic changes.  Right Lower Extremity: A right common  iliac arterial stent extends into the distal aorta and shows  normal patency. Heavily calcified plaque is present in the external iliac artery. An additional proximal right external iliac artery stent is open. The common femoral artery shows severe disease due to heavily calcified plaque.  The profunda femoral artery is open. Significant disease is present involving the proximal SFA with probable near critical stenosis at the SFA origin. The rest of the SFA is diffusely small in caliber and shows diffuse disease without occlusion. The distal SFA at the popliteal origin likely is critically narrowed. The popliteal artery shows diffuse disease but is open. Below the knee, the anterior tibial and posterior tibial arteries become occluded in the distal calf. The peroneal artery is open and supplies some collateral vessels in the proximal foot.  Left Lower Extremity: A proximal left common iliac artery stent extends into the distal aorta and is patent. The external iliac artery shows diffuse disease without significant stenosis. After patch angioplasty, the common femoral artery is widely patent and the profunda femoral artery also normally patent. The SFA is occluded at its origin. Profunda femoral branches eventually reconstitute flow in the popliteal artery above the knee.  The popliteal artery is patent across the knee joint and supplies patent anterior tibial and posterior tibial arteries which can be followed into the foot. The peroneal artery becomes occluded in the distal calf.  Fasciotomy changes are seen involving lateral and medial calves with surgical drains present. No abnormal fluid collections are seen in the left calf.  Review of the MIP images confirms the above findings.  The lower chest demonstrates trace bilateral pleural effusions and associated bibasilar atelectasis. There is vicarious excretion of contrast in the gallbladder. No evidence of bowel obstruction or ileus. No free air identified. Mild amount of nonspecific perinephric fluid present bilaterally,  left greater than right. No hydronephrosis present. No masses or enlarged lymph nodes are seen. A Foley catheter is present. Some soft tissue gas is present at the level of left femoral cut down. The spine demonstrates diffuse spondylosis.  IMPRESSION: 1. Flow is identified through the left-sided iliac arteries and common femoral artery postoperatively. The left SFA is occluded at its origin. The profunda femoral artery is open with collaterals reconstituting the popliteal artery above the knee. Open anterior tibial and posterior tibial runoff is demonstrated into the left foot. The left peroneal artery occludes in the distal calf. 2. Severe disease of the right common femoral artery. Right-sided common iliac and external iliac artery stents are open. Diffuse SFA disease is present on the right with probable critical stenosis at its origin and distally at the junction with the popliteal artery. Right-sided anterior tibial and posterior tibial arteries become occluded in the distal calf. The peroneal artery is open and supplies collaterals to the foot. 3. 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 The SMA trunk is reconstituted by pancreaticoduodenal collaterals supplied by the celiac axis. 4. Patent right renal artery stent. There likely is significant chronic stenosis/subtotal occlusive disease of the proximal left renal artery with associated smaller size of the left kidney compared to the right.   Electronically Signed   By: Aletta Edouard M.D.   On: 02/02/2015 11:39   Dg Chest Port 1 View  02/03/2015   CLINICAL DATA:  OG tube placement.  EXAM: PORTABLE CHEST - 1 VIEW  COMPARISON:  Yesterday at 2236 hour  FINDINGS: Tip and side port of the enteric tube below the diaphragm in the stomach. Endotracheal  tube is 6.7 cm from the carina. Tip of the right internal jugular catheter unchanged, likely in the upper SVC. The lungs remain hyperinflated. Biapical pleural parenchymal thickening,  unchanged. Developing left basilar opacity, may reflect atelectasis or pleural effusion. Cardiomediastinal contours are unchanged. No pulmonary edema.  IMPRESSION: 1. Endotracheal tube in place, 6.7 cm from the carina. Enteric tube below the diaphragm. 2. Developing left basilar opacity, may reflect atelectasis or pleural effusion. 3. Hyperinflation and emphysema.   Electronically Signed   By: Jeb Levering M.D.   On: 02/03/2015 03:35   Dg C-arm 61-120 Min-no Report  02/02/2015   CLINICAL DATA: surgery   C-ARM 61-120 MINUTES  Fluoroscopy was utilized by the requesting physician.  No radiographic  interpretation.       Indwelling Urinary Catheter continued, requirement due to   Reason to continue Indwelling Urinary Catheter for strict Intake/Output monitoring for hemodynamic instability   Central Line continued, requirement due to   Reason to continue [employer] Monitoring of central venous pressure or other hemodynamic parameters   Ventilator continued, requirement due to, resp failure          ASSESSMENT/PLAN    74 yo white female admitted to ICU for post op resp failure from severe metabolic acidosis from hypoperfusion and renal failure  Plan for SAT/SBt today and trial of extubation today   I have personally obtained a history, examined the patient, evaluated laboratory and imaging results, formulated the assessment and plan and placed orders.  The Patient requires high complexity decision making for assessment and support, frequent evaluation and titration of therapies, application of advanced monitoring technologies and extensive interpretation of multiple databases. Critical Care Time devoted to patient care services described in this note is 45 minutes.   Overall, patient is critically ill, prognosis is guarded.   Corrin Parker, M.D. Pulmonary & [place] Director Intensive Care Unit   02/03/2015, 8:35 AM

## 2015-02-03 NOTE — Progress Notes (Signed)
Mrs. Scali is alert and disoriented to time.  Pt very restless throughout the day. She was extubated this morning and put on nasal cannula. Nursing swallow eval. performed.  Pt placed on thin liquid diet and to advance as tolerated. Pt started on CIWA protocol. NS decreased to 73ml/hr due to congestion and fine crackles heard bilaterally. Left femoral site dressing was saturated with serous Fluid with  collection under the skin in pelvic area and around left abdominal area. Spoke with Dr. Lucky Cowboy. Ordered to apply new honeycomb dressing and a sand bag for pressure. Left lower dressing changed as ordered by Dr . Ronalee Belts.

## 2015-02-03 NOTE — Progress Notes (Signed)
nursing swallow exam performed . Pt passed. Diet will be placed

## 2015-02-03 NOTE — Progress Notes (Signed)
Spoke with Dr. Lavetta Nielsen about Pt's elevated lactic acid of 2.6. Orders for a 1L NS bolus were given. Will continue to monitor.

## 2015-02-03 NOTE — Progress Notes (Signed)
Nutrition Follow-up     INTERVENTION:   (Meals/Snacks: cater to pt preferences; add snacks between meals once diet advanced. )  NUTRITION DIAGNOSIS:  Inadequate oral intake related to inability to eat as evidenced by NPO status. Ongoing as evidenced by pt s/p extubation but being addressed as advancing diet to CL, advance as tolerated.     GOAL:   (Goal is for diet to be progressed to solids upon follow-up)    MONITOR:   (Energy Intake, Anthropometrics, Electrolyte/Renal Profile, Digestive System)  REASON FOR ASSESSMENT:  Other (Comment), Malnutrition Screening Tool    ASSESSMENT:  Pt with postop respiratory failure requiring intubation, severe acidosis; s/p extubation this AM. Alert and oriented.   Diet order: CL  Current Nutrition: No intake yet as pt just extubated, requesting GingerAle on visit this AM. Reports she is hungry  Nutrition Prior to Admission: Pt reports appetite good/"the same" prior to admission. Eats 2 to 3 meals per day. Breakfast usually consists of cheese toast or [employer] Gravy Biscuits or Sausage Rolls; Eats chicken or [place], or salad or soup made with half and half for other meals. Pt reports she snacks between meals on goldfish, wheat thins, etc. Pt reports she has tried nutritional supplements like Ensure in the past and does not like.    Output: Net +6.6 Liters since admission  Labs: Electrolyte and Renal Profile:  Recent Labs Lab 02/01/15 1753 02/02/15 0511 02/02/15 2258 02/03/15 0459  BUN 20 19 18 18   CREATININE 1.29* 1.14* 1.23* 1.22*  NA 127* 130* 129* 131*  K 4.8 4.5 4.6 3.7  MG 1.0* 1.0*  --  2.1  PHOS 4.0  --   --   --    Corrected Calcium 7.7  Protein Profile:  Recent Labs Lab 02/02/15 2258  ALBUMIN 2.6*    Medications: NS at 110 ml/hr, potassium chloride  Digestive system: pt reports no problems chewing or swallowing, passed Fenton Malling this AM  Nutrition Focused Physical Exam Findings: Nutrition-Focused  physical exam completed. Findings are fat depletion: well nourished,  muscle depletion: well nourished in all areas, and  Edema: none.    Height:  Ht Readings from Last 1 Encounters:  02/01/15 5' (1.524 m)    Weight: Pt reports UBW around 89-90 pounds. Pt reports she has weighed this for many months. Pt reports she was on a blood pressure medication 3.5 years ago that caused her to lose her appetite and lose weight; no longer on this medication.   Wt Readings from Last 1 Encounters:  02/01/15 95 lb 10.9 oz (43.4 kg)    Ideal Body Weight:     Wt Readings from Last 10 Encounters:  02/01/15 95 lb 10.9 oz (43.4 kg)  10/10/14 94 lb 2.2 oz (42.7 kg)  11/28/14 92 lb 8 oz (41.958 kg)  11/22/14 93 lb (42.185 kg)  11/11/14 93 lb (42.185 kg)  10/14/14 95 lb 8 oz (43.319 kg)  08/18/14 99 lb 8 oz (45.133 kg)  05/23/14 100 lb (45.36 kg)  04/11/14 99 lb 4 oz (45.02 kg)  02/08/14 101 lb 4 oz (45.927 kg)    BMI:  Body mass index is 18.69 kg/(m^2).  Estimated Nutritional Needs:  Kcal:  1210-1321 kcals (BEE 847, 1.3 AF, 1.1-1.2 IF) using current wt  Protein:  43-25 g (1.0-1.2 g/kg)   Fluid:  1290-1505 mL (30-35 mL/kg)   Skin:  Reviewed, no issues   Pt does not meet clinical characteristics for malnutrition at this time.   LOW Care Level  Kerman Passey [place], [place], LDN [phone] Pager

## 2015-02-03 NOTE — Progress Notes (Signed)
[facility]  Daily Progress Note   Subjective  - 1,2 Day Post-Op  Patient extubated this morning states she is hungry. At the time of my visit she states she is in good pain control.  Objective Filed Vitals:   02/03/15 1400 02/03/15 1500 02/03/15 1536 02/03/15 1600  BP: 99/40 114/84  103/61  Pulse: 58 76  78  Temp: 99.1 F (37.3 C) 99.1 F (37.3 C)  99.3 F (37.4 C)  TempSrc:      Resp: 14 15  16   Height:      Weight:      SpO2: 94% 93% 90% 95%    Intake/Output Summary (Last 24 hours) at 02/03/15 1709 Last data filed at 02/03/15 1600  Gross per 24 hour  Intake 5967.49 ml  Output    425 ml  Net 5542.49 ml    PULM  Normal effort , no use of accessory muscles CV  No JVD, RRR Abd      No distended, nontender VASC  both feet are warm with good capillary refill pulses are present by Doppler there is a 2+ pulse in the left in situ bypass graft all incisions are clean dry and intact  Laboratory CBC    Component Value Date/Time   WBC 4.3 02/03/2015 0459   WBC 5.2 09/14/2014 1426   WBC CANCELED 03/18/2013 1603   HGB 8.9* 02/03/2015 0459   HGB 13.4 09/14/2014 1426   HCT 27.4* 02/03/2015 0459   HCT 45.7 12/08/2014 1310   PLT 156 02/03/2015 0459   PLT 240 09/14/2014 1426    BMET    Component Value Date/Time   NA 131* 02/03/2015 0459   NA 133* 10/20/2014 1603   NA 131* 04/22/2013 1546   K 3.7 02/03/2015 0459   K 4.0 04/22/2013 1546   CL 106 02/03/2015 0459   CL 100 04/22/2013 1546   CO2 20* 02/03/2015 0459   CO2 23 04/22/2013 1546   GLUCOSE 110* 02/03/2015 0459   GLUCOSE 99 10/20/2014 1603   GLUCOSE 82 04/22/2013 1546   BUN 18 02/03/2015 0459   BUN 13 10/20/2014 1603   BUN 21* 04/22/2013 1546   CREATININE 1.22* 02/03/2015 0459   CREATININE 1.34* 09/14/2014 1426   CALCIUM 6.6* 02/03/2015 0459   CALCIUM 8.0* 09/14/2014 1426   GFRNONAA 42* 02/03/2015 0459   GFRNONAA 44* 01/12/2014 1420   GFRAA 49* 02/03/2015 0459   GFRAA 51*  01/12/2014 1420    Assessment/Planning: POD #1, 2 s/p #1 left femoral endarterectomy with angioplasty of the left common iliac; #2 right femoral endarterectomy with bilateral common and external iliac artery stenting and left femoral to below the knee popliteal bypass with in situ great saphenous vein   At the present time the patient is doing well she is extubated and we will start her diet. She will begin getting out of bed tomorrow. Pain control be adjusted and she'll be restarted on her oral medications as needed. Physical therapy will be consult.    Jennifer Simpson, Jennifer Simpson  02/03/2015, 5:09 PM

## 2015-02-04 LAB — CBC
HCT: 25.9 % — ABNORMAL LOW (ref 35.0–47.0)
Hemoglobin: 8.4 g/dL — ABNORMAL LOW (ref 12.0–16.0)
MCH: 33.1 pg (ref 26.0–34.0)
MCHC: 32.5 g/dL (ref 32.0–36.0)
MCV: 101.9 fL — ABNORMAL HIGH (ref 80.0–100.0)
Platelets: 150 10*3/uL (ref 150–440)
RBC: 2.54 MIL/uL — ABNORMAL LOW (ref 3.80–5.20)
RDW: 21.5 % — ABNORMAL HIGH (ref 11.5–14.5)
WBC: 3 10*3/uL — ABNORMAL LOW (ref 3.6–11.0)

## 2015-02-04 LAB — BASIC METABOLIC PANEL
Anion gap: 6 (ref 5–15)
BUN: 21 mg/dL — ABNORMAL HIGH (ref 6–20)
CALCIUM: 7 mg/dL — AB (ref 8.9–10.3)
CHLORIDE: 108 mmol/L (ref 101–111)
CO2: 17 mmol/L — AB (ref 22–32)
CREATININE: 1.56 mg/dL — AB (ref 0.44–1.00)
GFR calc Af Amer: 36 mL/min — ABNORMAL LOW (ref >60)
GFR, EST NON AFRICAN AMERICAN: 31 mL/min — AB (ref >60)
Glucose, Bld: 94 mg/dL (ref 65–99)
POTASSIUM: 3.7 mmol/L (ref 3.5–5.1)
Sodium: 131 mmol/L — ABNORMAL LOW (ref 135–145)

## 2015-02-04 LAB — PHOSPHORUS: Phosphorus: 4.2 mg/dL (ref 2.5–4.6)

## 2015-02-04 LAB — APTT: aPTT: 80 seconds — ABNORMAL HIGH (ref 24–36)

## 2015-02-04 LAB — MAGNESIUM: Magnesium: 1.9 mg/dL (ref 1.7–2.4)

## 2015-02-04 MED ORDER — LORAZEPAM 2 MG/ML IJ SOLN
1.0000 mg | INTRAMUSCULAR | Status: DC | PRN
  Administered 2015-02-04 – 2015-02-10 (×7): 2 mg via INTRAVENOUS
  Filled 2015-02-04 (×7): qty 1

## 2015-02-04 NOTE — Evaluation (Signed)
Physical Therapy Evaluation Patient Details Name: Jennifer Simpson MRN: [id number] DOB: [birth year] Today's Date: 02/04/2015   History of Present Illness  progressive b/l LE weakness, circulatory issues, needed sx, was extubated 6/3 AM  Clinical Impression  Pt shows decent U&LE strength with exercises, but her o2 drops from mid 90s to high 70s with minimal exertion getting to EOB. She appears fatigued, has SOB and does not seem ready to try standing or further mobility at this time.  She normally lives alone and is independent and it appears she will need STR on d/c from hospital.    Follow Up Recommendations SNF    Equipment Recommendations   (TBD)    Recommendations for Other Services       Precautions / Restrictions Precautions Precautions: Fall Restrictions Weight Bearing Restrictions: No (WBAT )      Mobility  Bed Mobility Overal bed mobility: Needs Assistance Bed Mobility: Supine to Sit;Sit to Supine     Supine to sit: Mod assist Sit to supine: Mod assist   General bed mobility comments: Pt becomes very fatigued with the effort of sitting up, even with 1-2 minutes of seated breathing her o2 remains 78-85% with heavy breathing and HR >100, deferred further mobilization  Transfers                    Ambulation/Gait                Stairs            Wheelchair Mobility    Modified Rankin (Stroke Patients Only)       Balance                                             Pertinent Vitals/Pain Pain Assessment:  (moderate/minimal pain in LEs)    Home Living Family/patient expects to be discharged to:: Skilled nursing facility (pt would rather go home) Living Arrangements: Alone               Additional Comments: Pt normally is able to care for herself at home: driving, running errands, etc    Prior Function Level of Independence: Independent with assistive device(s) (recently started using cane)                Hand Dominance        Extremity/Trunk Assessment   Upper Extremity Assessment: Overall WFL for tasks assessed           Lower Extremity Assessment: Generalized weakness         Communication   Communication: No difficulties  Cognition Arousal/Alertness: Awake/alert Behavior During Therapy: WFL for tasks assessed/performed Overall Cognitive Status:  (Pt able to participate, seems somewhat confused/impulsive)                      General Comments      Exercises General Exercises - Lower Extremity Ankle Circles/Pumps: AROM;10 reps Heel Slides: 10 reps;AROM Hip ABduction/ADduction: 10 reps;AROM      Assessment/Plan    PT Assessment Patient needs continued PT services  PT Diagnosis Difficulty walking;Generalized weakness   PT Problem List Decreased strength;Decreased mobility;Decreased activity tolerance;Decreased safety awareness  PT Treatment Interventions Gait training;Functional mobility training;Therapeutic activities;Therapeutic exercise;Balance training;Neuromuscular re-education   PT Goals (Current goals can be found in the Care Plan section) Acute Rehab PT Goals Patient Stated Goal: "I'd  rather go home" PT Goal Formulation: With patient Potential to Achieve Goals: Good    Frequency Min 2X/week   Barriers to discharge        Co-evaluation               End of Session   Activity Tolerance: Patient limited by fatigue Patient left: with nursing/sitter in room Nurse Communication: Mobility status         Time: 7867-6720 PT Time Calculation (min) (ACUTE ONLY): 25 min   Charges:   PT Evaluation $Initial PT Evaluation Tier I: 1 Procedure PT Treatments $Therapeutic Exercise: 8-22 mins   PT G Codes:       Wayne Both, PT, DPT [phone]  Kreg Shropshire 02/04/2015, 1:50 PM

## 2015-02-04 NOTE — Progress Notes (Signed)
[facility] NOTE  Pharmacy Consult for Electrolyte Management Indication: ICU Status   Allergies  Allergen Reactions  . Trimethobenzamide Other (See Comments)    Eye allergy, whites of eyes turned red, for about two weeks, there after.    Marland Kitchen Amoxicillin-Pot Clavulanate Nausea And Vomiting    vomiting after 45 min of taking pill  . Ceftin [Cefuroxime Axetil] Other (See Comments)    Unknown   . Chlorthalidone     hyponatremia  . Hctz [Hydrochlorothiazide]     Hyponatremia   . Polymyxin B-Trimethoprim Itching    eyes red  itching  . Tequin [Gatifloxacin] Itching    Patient Measurements: Height: 5' (152.4 cm) Weight: 95 lb 10.9 oz (43.4 kg) IBW/kg (Calculated) : 45.5   Vital Signs: Temp: 99.5 F (37.5 C) (06/04 1100) Temp Source: Rectal (06/04 1200) BP: 105/82 mmHg (06/04 1200) Pulse Rate: 97 (06/04 1200) Intake/Output from previous day: 06/03 0701 - 06/04 0700 In: 3243 [P.O.:340; I.V.:2303; IV Piggyback:600] Out: 680 [Urine:680] Intake/Output from this shift: Total I/O In: 516 [I.V.:516] Out: 100 [Urine:100] Vent settings for last 24 hours:    Labs:  Recent Labs  02/01/15 1753 02/02/15 0511 02/02/15 2258 02/03/15 0459 02/03/15 1740 02/04/15 0526 02/04/15 0551  WBC 9.0 9.2 8.8 4.3 4.8 3.0*  --   HGB 13.1 11.3* 10.5* 8.9* 9.0* 8.4*  --   HCT 39.3 33.9* 32.8* 27.4* 27.6* 25.9*  --   PLT 287 199 186 156 161 150  --   APTT 33  --  >160*  --   --  80*  --   INR 1.37  --  1.82  --   --   --   --   CREATININE 1.29* 1.14* 1.23* 1.22*  --   --  1.56*  MG 1.0* 1.0*  --  2.1  --  1.9  --   PHOS 4.0  --   --   --   --  4.2  --   ALBUMIN  --   --  2.6*  --   --   --   --   PROT  --   --  4.6*  --   --   --   --   AST  --   --  150*  --   --   --   --   ALT  --   --  50  --   --   --   --   ALKPHOS  --   --  58  --   --   --   --   BILITOT  --   --  0.8  --   --   --   --    Estimated Creatinine Clearance: 21.3 mL/min (by C-G formula based  on Cr of 1.56).   Recent Labs  02/02/15 2008  GLUCAP 156*    Microbiology: No results found for this or any previous visit (from the past 720 hour(s)).  Medications:  Scheduled:  . clindamycin (CLEOCIN) IV  300 mg Intravenous On Call to OR  . famotidine (PEPCID) IV  20 mg Intravenous Q0600  . mupirocin ointment   Topical BID  . nicotine  14 mg Transdermal Daily   Infusions:  . sodium chloride 100 mL/hr at 02/04/15 1157  . heparin 400 Units/hr (02/04/15 0700)    Assessment: 74 yo female post extubation. Patient on physician managed heparin drip.     Plan:  1. Electrolytes: Electrolytes WNL. Will continue  to monitor with am labs.    2. Heparin Drip: MD managing. Will continue to monitor CBC per protocol.    Pharmacy will continue to monitor and adjust per consult.    Olivia Canter, [place] 02/04/2015,12:11 PM

## 2015-02-04 NOTE — Progress Notes (Signed)
[facility]  Daily Progress Note   Subjective  - 2 Days Post-Op  A fair bit of agitation earlier.  Given Ativan on CIWA protocol and now sleeping soundly UOP was low yesterday but with two straight days of major surgery, third spacing expected.  Cr. 1.5 today. Has serous drainage, from wounds, no dehiscence or signs of infection Feet warm, good doppler signals and good cap refill.   Objective Filed Vitals:   02/04/15 1000 02/04/15 1004 02/04/15 1100 02/04/15 1200  BP: 68/40 109/68 115/90 105/82  Pulse: 89 92 96 97  Temp: 99.5 F (37.5 C) 99.5 F (37.5 C) 99.5 F (37.5 C) 99.7 F (37.6 C)  TempSrc: Rectal Rectal Rectal Rectal  Resp: 29 26 21 14   Height:      Weight:      SpO2: 95% 95% 93% 89%    Intake/Output Summary (Last 24 hours) at 02/04/15 1317 Last data filed at 02/04/15 1200  Gross per 24 hour  Intake   3088 ml  Output    780 ml  Net   2308 ml    PULM  CTAB CV  RRR VASC  As above  Laboratory CBC    Component Value Date/Time   WBC 3.0* 02/04/2015 0526   WBC 5.2 09/14/2014 1426   WBC CANCELED 03/18/2013 1603   HGB 8.4* 02/04/2015 0526   HGB 13.4 09/14/2014 1426   HCT 25.9* 02/04/2015 0526   HCT 45.7 12/08/2014 1310   PLT 150 02/04/2015 0526   PLT 240 09/14/2014 1426    BMET    Component Value Date/Time   NA 131* 02/04/2015 0551   NA 133* 10/20/2014 1603   NA 131* 04/22/2013 1546   K 3.7 02/04/2015 0551   K 4.0 04/22/2013 1546   CL 108 02/04/2015 0551   CL 100 04/22/2013 1546   CO2 17* 02/04/2015 0551   CO2 23 04/22/2013 1546   GLUCOSE 94 02/04/2015 0551   GLUCOSE 99 10/20/2014 1603   GLUCOSE 82 04/22/2013 1546   BUN 21* 02/04/2015 0551   BUN 13 10/20/2014 1603   BUN 21* 04/22/2013 1546   CREATININE 1.56* 02/04/2015 0551   CREATININE 1.34* 09/14/2014 1426   CALCIUM 7.0* 02/04/2015 0551   CALCIUM 8.0* 09/14/2014 1426   GFRNONAA 31* 02/04/2015 0551   GFRNONAA 44* 01/12/2014 1420   GFRAA 36* 02/04/2015 0551   GFRAA 51* 01/12/2014 1420    Assessment/Planning: POD #2 and 3 s/p extensive BLE revascularizations   Continue supportive care.  Recheck labs in am, will give fluid challenge as needed for low UOP  Not to floor yet, maybe tomorrow  Ativan for agitation.  Apparently a heavy drinker  PT to increase mobility    DEW,JASON  02/04/2015, 1:17 PM

## 2015-02-04 NOTE — Progress Notes (Signed)
[facility] NOTE  Pharmacy Consult for Electrolyte Management Indication: ICU Status   Allergies  Allergen Reactions  . Trimethobenzamide Other (See Comments)    Eye allergy, whites of eyes turned red, for about two weeks, there after.    Marland Kitchen Amoxicillin-Pot Clavulanate Nausea And Vomiting    vomiting after 45 min of taking pill  . Ceftin [Cefuroxime Axetil] Other (See Comments)    Unknown   . Chlorthalidone     hyponatremia  . Hctz [Hydrochlorothiazide]     Hyponatremia   . Polymyxin B-Trimethoprim Itching    eyes red  itching  . Tequin [Gatifloxacin] Itching    Patient Measurements: Height: 5' (152.4 cm) Weight: 95 lb 10.9 oz (43.4 kg) IBW/kg (Calculated) : 45.5   Vital Signs: Temp: 99.7 F (37.6 C) (06/04 0000) Temp Source: Rectal (06/04 0000) BP: 105/45 mmHg (06/04 0000) Pulse Rate: 77 (06/04 0000) Intake/Output from previous day: 06/03 0701 - 06/04 0700 In: 2407 [P.O.:340; I.V.:1567; IV Piggyback:500] Out: 530 [Urine:530] Intake/Output from this shift: Total I/O In: 1012 [P.O.:340; I.V.:672] Out: 130 [Urine:130] Vent settings for last 24 hours: Vent Mode:  [-] PRVC FiO2 (%):  [50 %] 50 % Set Rate:  [18 bmp] 18 bmp Vt Set:  [450 mL] 450 mL PEEP:  [5 cmH20] 5 cmH20  Labs:  Recent Labs  02/01/15 1753 02/02/15 0511 02/02/15 2258 02/03/15 0459 02/03/15 1740  WBC 9.0 9.2 8.8 4.3 4.8  HGB 13.1 11.3* 10.5* 8.9* 9.0*  HCT 39.3 33.9* 32.8* 27.4* 27.6*  PLT 287 199 186 156 161  APTT 33  --  >160*  --   --   INR 1.37  --  1.82  --   --   CREATININE 1.29* 1.14* 1.23* 1.22*  --   MG 1.0* 1.0*  --  2.1  --   PHOS 4.0  --   --   --   --   ALBUMIN  --   --  2.6*  --   --   PROT  --   --  4.6*  --   --   AST  --   --  150*  --   --   ALT  --   --  50  --   --   ALKPHOS  --   --  58  --   --   BILITOT  --   --  0.8  --   --    Estimated Creatinine Clearance: 27.3 mL/min (by C-G formula based on Cr of 1.22).   Recent Labs   02/02/15 2008  GLUCAP 156*    Microbiology: No results found for this or any previous visit (from the past 720 hour(s)).  Medications:  Scheduled:  . clindamycin (CLEOCIN) IV  300 mg Intravenous On Call to OR  . famotidine (PEPCID) IV  20 mg Intravenous Q0600  . mupirocin ointment   Topical BID  . nicotine  14 mg Transdermal Daily   Infusions:  . sodium chloride 100 mL/hr at 02/03/15 2323  . heparin 400 Units/hr (02/03/15 2200)    Assessment: 74 yo female post extubation. Patient on physician managed heparin drip.     Plan:  1. Electrolytes: Electrolytes WNL. Will continue to monitor with am labs.    2. Heparin Drip: MD managing. Will continue to monitor CBC per protocol.    Pharmacy will continue to monitor and adjust per consult.    Simpson,Michael L 02/04/2015,1:07 AM

## 2015-02-05 DIAGNOSIS — F10231 Alcohol dependence with withdrawal delirium: Secondary | ICD-10-CM

## 2015-02-05 DIAGNOSIS — F10931 Alcohol use, unspecified with withdrawal delirium: Secondary | ICD-10-CM

## 2015-02-05 DIAGNOSIS — I998 Other disorder of circulatory system: Secondary | ICD-10-CM

## 2015-02-05 LAB — BASIC METABOLIC PANEL
ANION GAP: 6 (ref 5–15)
BUN: 24 mg/dL — ABNORMAL HIGH (ref 6–20)
CHLORIDE: 113 mmol/L — AB (ref 101–111)
CO2: 16 mmol/L — ABNORMAL LOW (ref 22–32)
Calcium: 7.4 mg/dL — ABNORMAL LOW (ref 8.9–10.3)
Creatinine, Ser: 1.5 mg/dL — ABNORMAL HIGH (ref 0.44–1.00)
GFR calc Af Amer: 38 mL/min — ABNORMAL LOW (ref >60)
GFR calc non Af Amer: 33 mL/min — ABNORMAL LOW (ref >60)
GLUCOSE: 96 mg/dL (ref 65–99)
Potassium: 3.6 mmol/L (ref 3.5–5.1)
SODIUM: 135 mmol/L (ref 135–145)

## 2015-02-05 LAB — MAGNESIUM: MAGNESIUM: 1.9 mg/dL (ref 1.7–2.4)

## 2015-02-05 LAB — PHOSPHORUS: PHOSPHORUS: 3.9 mg/dL (ref 2.5–4.6)

## 2015-02-05 LAB — APTT: aPTT: 107 seconds — ABNORMAL HIGH (ref 24–36)

## 2015-02-05 MED ORDER — M.V.I. ADULT IV INJ
INJECTION | INTRAVENOUS | Status: DC
Start: 2015-02-06 — End: 2015-02-07
  Administered 2015-02-06 – 2015-02-07 (×2): via INTRAVENOUS
  Filled 2015-02-05 (×2): qty 10

## 2015-02-05 MED ORDER — DEXMEDETOMIDINE HCL IN NACL 400 MCG/100ML IV SOLN
0.4000 ug/kg/h | INTRAVENOUS | Status: DC
  Administered 2015-02-05 – 2015-02-06 (×3): 1 ug/kg/h via INTRAVENOUS
  Administered 2015-02-07: 0.2 ug/kg/h via INTRAVENOUS
  Filled 2015-02-05 (×3): qty 100

## 2015-02-05 MED ORDER — SODIUM CHLORIDE 0.9 % IV BOLUS (SEPSIS)
500.0000 mL | Freq: Once | INTRAVENOUS | Status: AC
  Administered 2015-02-05: 500 mL via INTRAVENOUS

## 2015-02-05 MED ORDER — M.V.I. ADULT IV INJ
Freq: Once | INTRAVENOUS | Status: AC
  Administered 2015-02-05: 11:00:00 via INTRAVENOUS
  Filled 2015-02-05: qty 10

## 2015-02-05 NOTE — Progress Notes (Signed)
Precedex drip started.  Patient remains combative.
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
## 2015-02-05 NOTE — Progress Notes (Signed)
Patient with less combative.  Will continue to monitor.

## 2015-02-05 NOTE — Consult Note (Signed)
PULMONARY / CRITICAL CARE MEDICINE   Name: Jennifer Simpson MRN: [id number] DOB: 05-Jul-1941    ADMISSION DATE:  02/01/2015    CHIEF COMPLAINT:   Acute resp failure   HISTORY OF PRESENT ILLNESS   Extubated, on minimal oxygen therapy, now with acute delirium likely from ETOH withdrawal. Started on precedex, will need ICU monitoring    SIGNIFICANT EVENTS   1. Right common femoral endarterectomy with core matrix patch angioplasty 2. Right profunda femoris eversion endarterectomy 3. Right superficial femoral endarterectomy with core matrix patch angioplasty 4. Bilateral common iliac artery stenting using 8 mm Viabahn stents 5. Bilateral external iliac artery stenting using 7 mm Viabahn stents 6. Left femoral to below knee popliteal bypass using in situ great saphenous vein    PAST MEDICAL HISTORY    :  Past Medical History  Diagnosis Date  . Asthma   . Peripheral vascular disease     a. h/o iliac stents 2010. b. RAS as below.  . History of skin ulcer   . Hypertension   . GERD (gastroesophageal reflux disease)   . History of stomach ulcers   . Hyponatremia     Previously related to HCTZ/chlorthalidone  . Clotting disorder     left leg  . Chronic kidney disease   . Erythrocytosis 01/15/2015  . Lower leg DVT (deep venous thrombosis) 01/15/2015  . Collagen vascular disease   . Squamous cell carcinoma of oropharynx 2008     status post chemotherapy and radiation therapy in 2008  . Cancer of esophagus 2008    s/p chemo and radiation   Past Surgical History  Procedure Laterality Date  . Peripheral arterial stent graft Bilateral     x2 STENTS BOTH LEGS  . Anterior cervical decomp/discectomy fusion  ~ 2006    "put in spacer" (12/02/2012)  . Portacath placement  2008  . Anterior cervical decomp/discectomy fusion  10/07/2012    Procedure: ANTERIOR CERVICAL DECOMPRESSION/DISCECTOMY FUSION 2 LEVEL/HARDWARE REMOVAL;  Surgeon: Ophelia Charter, MD;  Location: [place] [facility];   Service: Neurosurgery;  Laterality: N/A;  Cervical three-four, cervical four-five Anterior cervical decompression/diskectomy/fusion/interbody plating and prosthesis/removal of spinal concept plate  . Cataract extraction w/ intraocular lens  implant, bilateral  ~2005  . Renal artery stent Right 12/02/2012    Successful angioplasty and bare-metal stent placement to the ostial right renal artery  . Tonsillectomy  ~ 1946  . Breast cyst excision Right ?1980's    negative  . Renal angiogram N/A 12/02/2012    Procedure: RENAL ANGIOGRAM;  Surgeon: Wellington Hampshire, MD;  Location: [facility] CATH LAB;  Service: Cardiovascular;  Laterality: N/A;  . Peripheral vascular catheterization Right 01/31/2015    Procedure: Lower Extremity Angiography;  Surgeon: Katha Cabal, MD;  Location: [place] CV LAB;  Service: Cardiovascular;  Laterality: Right;  . Peripheral vascular catheterization  01/31/2015    Procedure: Lower Extremity Intervention;  Surgeon: Katha Cabal, MD;  Location: [place] CV LAB;  Service: Cardiovascular;;  . Endarterectomy femoral N/A 02/01/2015    Procedure: ENDARTERECTOMY FEMORAL;  Surgeon: Katha Cabal, MD;  Location: [facility] ORS;  Service: Vascular;  Laterality: N/A;  . Insertion of iliac stent Left 02/01/2015    Procedure: INSERTION OF ILIAC STENT;  Surgeon: Katha Cabal, MD;  Location: [facility] ORS;  Service: Vascular;  Laterality: Left;  . Endarterectomy femoral Right 02/02/2015    Procedure: ENDARTERECTOMY FEMORAL;  Surgeon: Katha Cabal, MD;  Location: [facility] ORS;  Service: Vascular;  Laterality: Right;  .  Insertion of iliac stent Bilateral 02/02/2015    Procedure: INSERTION OF ILIAC STENT;  Surgeon: Katha Cabal, MD;  Location: [facility] ORS;  Service: Vascular;  Laterality: Bilateral;  . Femoral-popliteal bypass graft Left 02/02/2015    Procedure: BYPASS GRAFT FEMORAL-POPLITEAL ARTERY;  Surgeon: Katha Cabal, MD;  Location: [facility] ORS;  Service: Vascular;  Laterality: Left;    Prior to Admission medications   Medication Sig Start Date End Date Taking? Authorizing Provider  acetaminophen (TYLENOL) 500 MG tablet Take 1,000 mg by mouth every 6 (six) hours as needed for pain.   Yes Historical Provider, MD  diltiazem (CARDIZEM CD) 240 MG 24 hr capsule Take 1 capsule (240 mg total) by mouth daily. 11/23/14  Yes Wellington Hampshire, MD  fluticasone (FLONASE) 50 MCG/ACT nasal spray Place 2 sprays into the nose daily.  06/08/13  Yes Historical Provider, MD  hydrALAZINE (APRESOLINE) 10 MG tablet Take 1 tablet (10 mg total) by mouth 3 (three) times daily. 12/09/14  Yes Ryan M Dunn, PA-C  losartan (COZAAR) 100 MG tablet Take 1 tablet (100 mg total) by mouth daily. 06/13/14  Yes Wellington Hampshire, MD  mupirocin ointment (BACTROBAN) 2 %  01/05/15  Yes Historical Provider, MD  pantoprazole (PROTONIX) 40 MG tablet daily. 10/28/14  Yes Historical Provider, MD  promethazine (PHENERGAN) 12.5 MG tablet Take 1 tablet (12.5 mg total) by mouth every 8 (eight) hours as needed for nausea or vomiting. 12/20/14  Yes Jearld Fenton, NP  Ranitidine HCl (ZANTAC PO) Take by mouth daily.   Yes Historical Provider, MD  spironolactone (ALDACTONE) 25 MG tablet Take 1 tablet (25 mg total) by mouth daily. 10/14/14  Yes Wellington Hampshire, MD  warfarin (COUMADIN) 2.5 MG tablet Take 2.5 mg by mouth daily. Mon, Wed, Fri, and Sat   Yes Historical Provider, MD  albuterol-ipratropium (COMBIVENT) 18-103 MCG/ACT inhaler Inhale 2 puffs into the lungs every 6 (six) hours as needed.    Historical Provider, MD  calcium carbonate (TUMS - DOSED IN MG ELEMENTAL CALCIUM) 500 MG chewable tablet Chew 1 tablet by mouth 2 (two) times daily.    Historical Provider, MD  cyclobenzaprine (FLEXERIL) 5 MG tablet Take 1 tablet (5 mg total) by mouth 2 (two) times daily as needed for muscle spasms (sedation caution). Patient not taking: Reported on 12/26/2014 12/09/14   Tonia Ghent, MD  multivitamin-iron-minerals-folic acid (CENTRUM) chewable  tablet Chew 1 tablet by mouth daily.    Historical Provider, MD   Allergies  Allergen Reactions  . Trimethobenzamide Other (See Comments)    Eye allergy, whites of eyes turned red, for about two weeks, there after.    Marland Kitchen Amoxicillin-Pot Clavulanate Nausea And Vomiting    vomiting after 45 min of taking pill  . Ceftin [Cefuroxime Axetil] Other (See Comments)    Unknown   . Chlorthalidone     hyponatremia  . Hctz [Hydrochlorothiazide]     Hyponatremia   . Polymyxin B-Trimethoprim Itching    eyes red  itching  . Tequin [Gatifloxacin] Itching     FAMILY HISTORY   Family History  Problem Relation Age of Onset  . Diabetes Sister   . Cancer Neg Hx   . Stroke Neg Hx       SOCIAL HISTORY    reports that she has been smoking Cigarettes.  She has a 12.5 pack-year smoking history. She has never used smokeless tobacco. She reports that she drinks about 3.0 oz of alcohol per week. She reports that she does  not use illicit drugs.  Review of Systems  Unable to perform ROS: critical illness      VITAL SIGNS    Temp:  [91.8 F (33.2 C)-100.8 F (38.2 C)] 91.8 F (33.2 C) (06/05 0800) Pulse Rate:  [48-124] 48 (06/05 0900) Resp:  [12-29] 12 (06/05 0900) BP: (68-134)/(40-90) 110/56 mmHg (06/05 0900) SpO2:  [88 %-100 %] 100 % (06/05 0900) Arterial Line BP: (98-128)/(40-79) 125/69 mmHg (06/04 1800) Weight:  [127 lb 10.3 oz (57.9 kg)] 127 lb 10.3 oz (57.9 kg) (06/05 0456) HEMODYNAMICS:   VENTILATOR SETTINGS:   INTAKE / OUTPUT:  Intake/Output Summary (Last 24 hours) at 02/05/15 0929 Last data filed at 02/05/15 0900  Gross per 24 hour  Intake 2679.4 ml  Output    575 ml  Net 2104.4 ml       PHYSICAL EXAM   Physical Exam  Constitutional: No distress.  HENT:  Head: Normocephalic and atraumatic.  Eyes: Pupils are equal, round, and reactive to light. No scleral icterus.  Neck: Normal range of motion. Neck supple.  Cardiovascular: Normal rate and regular rhythm.   No  murmur heard. Pulmonary/Chest: No respiratory distress. She has no wheezes. She has no rales.  resp distress  Abdominal: Soft. She exhibits no distension. There is no tenderness.  Musculoskeletal: She exhibits no edema.  Neurological: She displays normal reflexes. Coordination normal.  Lethargic, on precedex  Skin: Skin is warm. No rash noted. She is not diaphoretic.       LABS   LABS:  CBC  Recent Labs Lab 02/03/15 0459 02/03/15 1740 02/04/15 0526  WBC 4.3 4.8 3.0*  HGB 8.9* 9.0* 8.4*  HCT 27.4* 27.6* 25.9*  PLT 156 161 150   Coag's  Recent Labs Lab 01/31/15 0922  02/01/15 1753 02/02/15 2258 02/04/15 0526 02/05/15 0443  APTT  --   < > 33 >160* 80* 107*  INR 1.15  --  1.37 1.82  --   --   < > = values in this interval not displayed. BMET  Recent Labs Lab 02/03/15 0459 02/04/15 0551 02/05/15 0443  NA 131* 131* 135  K 3.7 3.7 3.6  CL 106 108 113*  CO2 20* 17* 16*  BUN 18 21* 24*  CREATININE 1.22* 1.56* 1.50*  GLUCOSE 110* 94 96   Electrolytes  Recent Labs Lab 02/01/15 1753  02/03/15 0459 02/04/15 0526 02/04/15 0551 02/05/15 0443  CALCIUM 9.0  < > 6.6*  --  7.0* 7.4*  MG 1.0*  < > 2.1 1.9  --  1.9  PHOS 4.0  --   --  4.2  --  3.9  < > = values in this interval not displayed. Sepsis Markers  Recent Labs Lab 02/02/15 2020 02/03/15 0159  LATICACIDVEN 2.6* 1.4   ABG  Recent Labs Lab 02/02/15 2050 02/03/15 0413  PHART 7.10* 7.41  PCO2ART 48 27*  PO2ART 69* 208*   Liver Enzymes  Recent Labs Lab 02/02/15 2258  AST 150*  ALT 50  ALKPHOS 58  BILITOT 0.8  ALBUMIN 2.6*   Cardiac Enzymes No results for input(s): TROPONINI, PROBNP in the last 168 hours. Glucose  Recent Labs Lab 02/02/15 2008  GLUCAP 156*     No results found for this or any previous visit (from the past 240 hour(s)).   Current facility-administered medications:  .  0.9 %  sodium chloride infusion, , Intravenous, Continuous, Algernon Huxley, MD, Last Rate:  100 mL/hr at 02/04/15 2141, 100 mL/hr at 02/04/15 2141 .  acetaminophen (  TYLENOL) tablet 325-650 mg, 325-650 mg, Oral, Q4H PRN, 650 mg at 02/03/15 1345 **OR** acetaminophen (TYLENOL) suppository 325-650 mg, 325-650 mg, Rectal, Q4H PRN, Katha Cabal, MD .  acetaminophen (TYLENOL) tablet 325-650 mg, 325-650 mg, Oral, Q4H PRN **OR** acetaminophen (TYLENOL) suppository 325-650 mg, 325-650 mg, Rectal, Q4H PRN, Katha Cabal, MD .  dexmedetomidine (PRECEDEX) 400 MCG/100ML (4 mcg/mL) infusion, 0.4-1.2 mcg/kg/hr, Intravenous, Titrated, Lytle Butte, MD, Last Rate: 2.2 mL/hr at 02/05/15 0800, 0.2 mcg/kg/hr at 02/05/15 0800 .  famotidine (PEPCID) IVPB 20 mg premix, 20 mg, Intravenous, Q0600, Flora Lipps, MD, 20 mg at 02/05/15 0547 .  heparin ADULT infusion 100 units/mL (25000 units/250 mL), 400 Units/hr, Intravenous, Continuous, Katha Cabal, MD, Last Rate: 4 mL/hr at 02/04/15 1900, 400 Units/hr at 02/04/15 1900 .  hydrALAZINE (APRESOLINE) injection 5 mg, 5 mg, Intravenous, Q20 Min PRN, Katha Cabal, MD .  HYDROmorphone (DILAUDID) injection 0.5 mg, 0.5 mg, Intravenous, Q2H PRN, Katha Cabal, MD .  ipratropium-albuterol (DUONEB) 0.5-2.5 (3) MG/3ML nebulizer solution 3 mL, 3 mL, Inhalation, Q6H PRN, Katha Cabal, MD, 3 mL at 02/03/15 1536 .  labetalol (NORMODYNE,TRANDATE) injection 10 mg, 10 mg, Intravenous, Q10 min PRN, Katha Cabal, MD, 10 mg at 02/03/15 9381 .  LORazepam (ATIVAN) injection 1-2 mg, 1-2 mg, Intravenous, Q2H PRN, Algernon Huxley, MD, 2 mg at 02/05/15 0045 .  LORazepam (ATIVAN) injection 2-3 mg, 2-3 mg, Intravenous, Q1H PRN, Katha Cabal, MD, 1 mg at 02/05/15 0044 .  mupirocin ointment (BACTROBAN) 2 %, , Topical, BID, Katha Cabal, MD .  nicotine (NICODERM CQ - dosed in mg/24 hours) patch 14 mg, 14 mg, Transdermal, Daily, Katha Cabal, MD, 14 mg at 02/04/15 8299 .  ondansetron (ZOFRAN) injection 4 mg, 4 mg, Intravenous, Q6H PRN, Katha Cabal, MD, 4  mg at 02/02/15 1851 .  oxyCODONE (Oxy IR/ROXICODONE) immediate release tablet 5-10 mg, 5-10 mg, Oral, Q4H PRN, Katha Cabal, MD, 10 mg at 02/04/15 0927 .  polyethylene glycol (MIRALAX / GLYCOLAX) packet 17 g, 17 g, Oral, Daily PRN, Katha Cabal, MD .  potassium chloride SA (K-DUR,KLOR-CON) CR tablet 20-40 mEq, 20-40 mEq, Oral, Daily PRN, Katha Cabal, MD  Facility-Administered Medications Ordered in Other Encounters:  .  0.9 %  sodium chloride infusion, , , Continuous PRN, Denise Justis, CRNA, Last Rate: 100 mL/hr at 02/04/15 1900, 100 mL/hr at 02/04/15 1900 .  fentaNYL (SUBLIMAZE) injection, , , Anesthesia Intra-op, Denise Justis, CRNA, 50 mcg at 02/02/15 1637 .  glycopyrrolate (ROBINUL) injection, , , Anesthesia Intra-op, Denise Justis, CRNA, 0.4 mg at 02/01/15 2113 .  heparin injection, , , Anesthesia Intra-op, Denise Justis, CRNA, 2 mL at 02/02/15 1814 .  neostigmine (BLOXIVERZ) injection, , Intravenous, Anesthesia Intra-op, Denise Justis, CRNA, 3 mg at 02/01/15 2113 .  rocuronium (ZEMURON) injection, , , Anesthesia Intra-op, Denise Justis, CRNA, 50 mg at 02/02/15 1325 .  vancomycin (VANCOCIN) 1,000 mg in sodium chloride 0.9 % 250 mL IVPB, 1,000 mg, Intravenous, Continuous PRN, Denise Justis, CRNA, 1,000 mg at 02/01/15 2159  IMAGING    No results found.    Indwelling Urinary Catheter continued, requirement due to   Reason to continue Indwelling Urinary Catheter for strict Intake/Output monitoring for hemodynamic instability   Central Line continued, requirement due to   Reason to continue [employer] Monitoring of central venous pressure or other hemodynamic parameters   Ventilator continued, requirement due to, resp failure  ASSESSMENT/PLAN    74 yo white female admitted to ICU for post op resp failure from severe metabolic acidosis from hypoperfusion and renal failure  Successfully extubated, however now with signs of ETOH withdrawal  1.oxygen  as needed 2.starte don precedex 3.CIWA protocol 4.IVF's with MVI/folic acid 5.needs ICU monitoring   I have personally obtained a history, examined the patient, evaluated laboratory and imaging results, formulated the assessment and plan and placed orders.  The Patient requires high complexity decision making for assessment and support, frequent evaluation and titration of therapies, application of advanced monitoring technologies and extensive interpretation of multiple databases. Critical Care Time devoted to patient care services described in this note is 35 minutes.   Overall, patient is critically ill, prognosis is guarded.   Corrin Parker, M.D. Pulmonary & [employer] Director Intensive Care Unit   02/05/2015, 9:29 AM

## 2015-02-05 NOTE — Progress Notes (Signed)
[place] NOTE  Pharmacy Consult for Electrolyte Management Indication: ICU Status   Allergies  Allergen Reactions  . Trimethobenzamide Other (See Comments)    Eye allergy, whites of eyes turned red, for about two weeks, there after.    Marland Kitchen Amoxicillin-Pot Clavulanate Nausea And Vomiting    vomiting after 45 min of taking pill  . Ceftin [Cefuroxime Axetil] Other (See Comments)    Unknown   . Chlorthalidone     hyponatremia  . Hctz [Hydrochlorothiazide]     Hyponatremia   . Polymyxin B-Trimethoprim Itching    eyes red  itching  . Tequin [Gatifloxacin] Itching    Patient Measurements: Height: 5' (152.4 cm) Weight: 127 lb 10.3 oz (57.9 kg) IBW/kg (Calculated) : 45.5   Vital Signs: Temp: 91.8 F (33.2 C) (06/05 0800) Temp Source: Rectal (06/05 0800) BP: 110/56 mmHg (06/05 0900) Pulse Rate: 48 (06/05 0900) Intake/Output from previous day: 06/04 0701 - 06/05 0700 In: 2666.3 [I.V.:2566.3; IV Piggyback:100] Out: 575 [Urine:575] Intake/Output from this shift: Total I/O In: 221.1 [I.V.:221.1] Out: -  Vent settings for last 24 hours:    Labs:  Recent Labs  02/02/15 2258 02/03/15 0459 02/03/15 1740 02/04/15 0526 02/04/15 0551 02/05/15 0443  WBC 8.8 4.3 4.8 3.0*  --   --   HGB 10.5* 8.9* 9.0* 8.4*  --   --   HCT 32.8* 27.4* 27.6* 25.9*  --   --   PLT 186 156 161 150  --   --   APTT >160*  --   --  80*  --  107*  INR 1.82  --   --   --   --   --   CREATININE 1.23* 1.22*  --   --  1.56* 1.50*  MG  --  2.1  --  1.9  --  1.9  PHOS  --   --   --  4.2  --  3.9  ALBUMIN 2.6*  --   --   --   --   --   PROT 4.6*  --   --   --   --   --   AST 150*  --   --   --   --   --   ALT 50  --   --   --   --   --   ALKPHOS 58  --   --   --   --   --   BILITOT 0.8  --   --   --   --   --    Estimated Creatinine Clearance: 25.8 mL/min (by C-G formula based on Cr of 1.5).   Recent Labs  02/02/15 2008  GLUCAP 156*    Microbiology: No results found  for this or any previous visit (from the past 720 hour(s)).  Medications:  Scheduled:  . famotidine (PEPCID) IV  20 mg Intravenous Q0600  . multivitamin (MVI) IV infusion (LR or D5LR)   Intravenous Once  . mupirocin ointment   Topical BID  . nicotine  14 mg Transdermal Daily   Infusions:  . dexmedetomidine 0.2 mcg/kg/hr (02/05/15 0800)  . heparin 400 Units/hr (02/04/15 1900)    Assessment: 74 yo female post extubation. Patient on physician managed heparin drip.     Plan:  1. Electrolytes: Electrolytes WNL. Will continue to monitor with am labs.    2. Heparin Drip: MD managing. Will continue to monitor CBC per protocol.    Pharmacy will continue to monitor  and adjust per consult.    Geneive Sandstrom K, [place] 02/05/2015,10:06 AM

## 2015-02-05 NOTE — Progress Notes (Signed)
[facility]  Daily Progress Note   Subjective  - 3 Days Post-Op  Patient was very combative last night with her alcohol withdrawal. Had to be placed on a Precedex drip which was stopped this morning. Getting when necessary Ativan currently. She is somnolent, but does arouse slightly with some questions. [name]. Still weeping from her wounds. Protein levels very poor. Urine output remains marginal, but creatinine is stable at 1.5 today. Feet warm. Good Doppler signals present in both feet. Good capillary refill.  Objective Filed Vitals:   02/05/15 1200 02/05/15 1300 02/05/15 1400 02/05/15 1500  BP: 96/77 103/57 103/53 90/57  Pulse: 70 61 64 67  Temp: 94.1 F (34.5 C) 94.6 F (34.8 C) 95.2 F (35.1 C) 96.3 F (35.7 C)  TempSrc: Rectal     Resp: 32 14 14 17   Height:      Weight:      SpO2: 96% 100% 100% 97%    Intake/Output Summary (Last 24 hours) at 02/05/15 1615 Last data filed at 02/05/15 1600  Gross per 24 hour  Intake 2468.53 ml  Output    600 ml  Net 1868.53 ml    PULM  CTAB CV  RRR VASC  perfusion status intact as above. Incisions weeping serous fluid but no signs of infection.  Laboratory CBC    Component Value Date/Time   WBC 3.0* 02/04/2015 0526   WBC 5.2 09/14/2014 1426   WBC CANCELED 03/18/2013 1603   HGB 8.4* 02/04/2015 0526   HGB 13.4 09/14/2014 1426   HCT 25.9* 02/04/2015 0526   HCT 45.7 12/08/2014 1310   PLT 150 02/04/2015 0526   PLT 240 09/14/2014 1426    BMET    Component Value Date/Time   NA 135 02/05/2015 0443   NA 133* 10/20/2014 1603   NA 131* 04/22/2013 1546   K 3.6 02/05/2015 0443   K 4.0 04/22/2013 1546   CL 113* 02/05/2015 0443   CL 100 04/22/2013 1546   CO2 16* 02/05/2015 0443   CO2 23 04/22/2013 1546   GLUCOSE 96 02/05/2015 0443   GLUCOSE 99 10/20/2014 1603   GLUCOSE 82 04/22/2013 1546   BUN 24* 02/05/2015 0443   BUN 13 10/20/2014 1603   BUN 21* 04/22/2013 1546   CREATININE 1.50*  02/05/2015 0443   CREATININE 1.34* 09/14/2014 1426   CALCIUM 7.4* 02/05/2015 0443   CALCIUM 8.0* 09/14/2014 1426   GFRNONAA 33* 02/05/2015 0443   GFRNONAA 44* 01/12/2014 1420   GFRAA 38* 02/05/2015 0443   GFRAA 51* 01/12/2014 1420    Assessment/Planning: POD #3 and 4 s/p bilateral lower extremity revascularizations which were quite extensive   Alcohol withdrawal playing a major role in limiting our abilities to increase her activity.   Preoperative deconditioning with poor protein stores making wound healing difficult. Serous drainage persists.  Respiratory status appears stable.  Appreciate critical care assistance with management.  Recheck labs in the a.m.  Physical therapy as tolerated when her mental status allows.    DEW,JASON  02/05/2015, 4:15 PM

## 2015-02-06 ENCOUNTER — Ambulatory Visit: Payer: Medicare Other | Admitting: Podiatry

## 2015-02-06 DIAGNOSIS — L8990 Pressure ulcer of unspecified site, unspecified stage: Secondary | ICD-10-CM | POA: Insufficient documentation

## 2015-02-06 LAB — APTT: APTT: 61 s — AB (ref 24–36)

## 2015-02-06 LAB — CBC
HEMATOCRIT: 28.1 % — AB (ref 35.0–47.0)
HEMOGLOBIN: 9.1 g/dL — AB (ref 12.0–16.0)
MCH: 33.5 pg (ref 26.0–34.0)
MCHC: 32.5 g/dL (ref 32.0–36.0)
MCV: 103.3 fL — ABNORMAL HIGH (ref 80.0–100.0)
PLATELETS: 145 10*3/uL — AB (ref 150–440)
RBC: 2.72 MIL/uL — AB (ref 3.80–5.20)
RDW: 21.7 % — AB (ref 11.5–14.5)
WBC: 5.1 10*3/uL (ref 3.6–11.0)

## 2015-02-06 LAB — C DIFFICILE QUICK SCREEN W PCR REFLEX
C DIFFICLE (CDIFF) ANTIGEN: NEGATIVE
C Diff interpretation: NEGATIVE
C Diff toxin: NEGATIVE

## 2015-02-06 LAB — BASIC METABOLIC PANEL
Anion gap: 8 (ref 5–15)
BUN: 26 mg/dL — ABNORMAL HIGH (ref 6–20)
CALCIUM: 7.7 mg/dL — AB (ref 8.9–10.3)
CHLORIDE: 112 mmol/L — AB (ref 101–111)
CO2: 14 mmol/L — ABNORMAL LOW (ref 22–32)
Creatinine, Ser: 1.46 mg/dL — ABNORMAL HIGH (ref 0.44–1.00)
GFR calc Af Amer: 39 mL/min — ABNORMAL LOW (ref >60)
GFR calc non Af Amer: 34 mL/min — ABNORMAL LOW (ref >60)
GLUCOSE: 82 mg/dL (ref 65–99)
Potassium: 3.8 mmol/L (ref 3.5–5.1)
Sodium: 134 mmol/L — ABNORMAL LOW (ref 135–145)

## 2015-02-06 LAB — PHOSPHORUS: Phosphorus: 4.3 mg/dL (ref 2.5–4.6)

## 2015-02-06 LAB — SURGICAL PATHOLOGY

## 2015-02-06 LAB — MAGNESIUM: Magnesium: 1.9 mg/dL (ref 1.7–2.4)

## 2015-02-06 MED ORDER — SODIUM CHLORIDE 0.9 % IV BOLUS (SEPSIS)
500.0000 mL | Freq: Once | INTRAVENOUS | Status: AC
  Administered 2015-02-06: 500 mL via INTRAVENOUS

## 2015-02-06 MED ORDER — MORPHINE SULFATE 2 MG/ML IJ SOLN
2.0000 mg | INTRAMUSCULAR | Status: DC | PRN
  Administered 2015-02-06 – 2015-02-07 (×3): 2 mg via INTRAVENOUS
  Filled 2015-02-06 (×3): qty 1

## 2015-02-06 MED ORDER — CLONAZEPAM 0.5 MG PO TABS: 0.5000 mg | ORAL_TABLET | Freq: Two times a day (BID) | ORAL | Status: DC

## 2015-02-06 MED ORDER — CLONAZEPAM 0.125 MG PO TBDP
0.5000 mg | ORAL_TABLET | Freq: Two times a day (BID) | ORAL | Status: DC
  Administered 2015-02-06 – 2015-02-07 (×3): 0.5 mg via ORAL
  Filled 2015-02-06 (×3): qty 4

## 2015-02-06 NOTE — Progress Notes (Signed)
[facility]  Daily Progress Note   Subjective  - 4,5 Days Post-Op  Remains confused and shouting out loud  Objective Filed Vitals:   02/06/15 1400 02/06/15 1500 02/06/15 1600 02/06/15 1700  BP: 108/52 124/57 118/55 131/61  Pulse: 93 92 104 113  Temp: 98.1 F (36.7 C) 98.2 F (36.8 C) 98.4 F (36.9 C)   TempSrc:      Resp: 24 24 28 24   Height:      Weight:      SpO2: 100% 99% 100% 96%    Intake/Output Summary (Last 24 hours) at 02/06/15 1750 Last data filed at 02/06/15 1723  Gross per 24 hour  Intake   1148 ml  Output    575 ml  Net    573 ml    PULM  Normal effort , no use of accessory muscles CV  No JVD, RRR Abd      No distended, nontender VASC  Feet with Doppler signals bilaterally good pulse in the graft at the knee level             Moderate lymphatic drainage from the left groin  Laboratory CBC    Component Value Date/Time   WBC 5.1 02/06/2015 0635   WBC 5.2 09/14/2014 1426   WBC CANCELED 03/18/2013 1603   HGB 9.1* 02/06/2015 0635   HGB 13.4 09/14/2014 1426   HCT 28.1* 02/06/2015 0635   HCT 45.7 12/08/2014 1310   PLT 145* 02/06/2015 0635   PLT 240 09/14/2014 1426    BMET    Component Value Date/Time   NA 134* 02/06/2015 0635   NA 133* 10/20/2014 1603   NA 131* 04/22/2013 1546   K 3.8 02/06/2015 0635   K 4.0 04/22/2013 1546   CL 112* 02/06/2015 0635   CL 100 04/22/2013 1546   CO2 14* 02/06/2015 0635   CO2 23 04/22/2013 1546   GLUCOSE 82 02/06/2015 0635   GLUCOSE 99 10/20/2014 1603   GLUCOSE 82 04/22/2013 1546   BUN 26* 02/06/2015 0635   BUN 13 10/20/2014 1603   BUN 21* 04/22/2013 1546   CREATININE 1.46* 02/06/2015 0635   CREATININE 1.34* 09/14/2014 1426   CALCIUM 7.7* 02/06/2015 0635   CALCIUM 8.0* 09/14/2014 1426   GFRNONAA 34* 02/06/2015 0635   GFRNONAA 44* 01/12/2014 1420   GFRAA 39* 02/06/2015 0635   GFRAA 51* 01/12/2014 1420    Assessment/Planning: POD #4,5s/p femoral endarterectomies bilateral with  bilateral iliac stenting and left fem BK pop bypass   Continue to treat the DT's  Will paint the left groin with betadine bid  Not really able to participate in PT  Continue ICU    Schnier, Dolores Lory  02/06/2015, 5:50 PM

## 2015-02-06 NOTE — Progress Notes (Signed)
Patient has been agitated  tonight requiring precedex drip.  Has not be combative, but mostly agitated, pulled oxygen, clothes and medical equipment off, yelling out at times.  Incision on legs/groins continue to weep moderate amount of serous drainange.  Bandages to all sites changed due to weeping.  Urine output continues to be low.

## 2015-02-06 NOTE — Consult Note (Signed)
PULMONARY / CRITICAL CARE MEDICINE   Name: Jennifer Simpson MRN: [id number] DOB: 21-Nov-1940    ADMISSION DATE:  02/01/2015      SUBJECTIVE   Extubated, on minimal oxygen therapy, now with acute delirium likely from ETOH withdrawal. Started on precedex, will need ICU monitoring Watch closely-wean off precedex today, on CIWA protocol    SIGNIFICANT EVENTS   1. Right common femoral endarterectomy with core matrix patch angioplasty 2. Right profunda femoris eversion endarterectomy 3. Right superficial femoral endarterectomy with core matrix patch angioplasty 4. Bilateral common iliac artery stenting using 8 mm Viabahn stents 5. Bilateral external iliac artery stenting using 7 mm Viabahn stents 6. Left femoral to below knee popliteal bypass using in situ great saphenous vein    PAST MEDICAL HISTORY    :  Past Medical History  Diagnosis Date  . Asthma   . Peripheral vascular disease     a. h/o iliac stents 2010. b. RAS as below.  . History of skin ulcer   . Hypertension   . GERD (gastroesophageal reflux disease)   . History of stomach ulcers   . Hyponatremia     Previously related to HCTZ/chlorthalidone  . Clotting disorder     left leg  . Chronic kidney disease   . Erythrocytosis 01/15/2015  . Lower leg DVT (deep venous thrombosis) 01/15/2015  . Collagen vascular disease   . Squamous cell carcinoma of oropharynx 2008     status post chemotherapy and radiation therapy in 2008  . Cancer of esophagus 2008    s/p chemo and radiation   Past Surgical History  Procedure Laterality Date  . Peripheral arterial stent graft Bilateral     x2 STENTS BOTH LEGS  . Anterior cervical decomp/discectomy fusion  ~ 2006    "put in spacer" (12/02/2012)  . Portacath placement  2008  . Anterior cervical decomp/discectomy fusion  10/07/2012    Procedure: ANTERIOR CERVICAL DECOMPRESSION/DISCECTOMY FUSION 2 LEVEL/HARDWARE REMOVAL;  Surgeon: Ophelia Charter, MD;  Location: [place] [facility]UR[facility] ORS;   Service: Neurosurgery;  Laterality: N/A;  Cervical three-four, cervical four-five Anterior cervical decompression/diskectomy/fusion/interbody plating and prosthesis/removal of spinal concept plate  . Cataract extraction w/ intraocular lens  implant, bilateral  ~2005  . Renal artery stent Right 12/02/2012    Successful angioplasty and bare-metal stent placement to the ostial right renal artery  . Tonsillectomy  ~ 1946  . Breast cyst excision Right ?1980's    negative  . Renal angiogram N/A 12/02/2012    Procedure: RENAL ANGIOGRAM;  Surgeon: Wellington Hampshire, MD;  Location: [facility] CATH LAB;  Service: Cardiovascular;  Laterality: N/A;  . Peripheral vascular catheterization Right 01/31/2015    Procedure: Lower Extremity Angiography;  Surgeon: Katha Cabal, MD;  Location: [place] CV LAB;  Service: Cardiovascular;  Laterality: Right;  . Peripheral vascular catheterization  01/31/2015    Procedure: Lower Extremity Intervention;  Surgeon: Katha Cabal, MD;  Location: [place] CV LAB;  Service: Cardiovascular;;  . Endarterectomy femoral N/A 02/01/2015    Procedure: ENDARTERECTOMY FEMORAL;  Surgeon: Katha Cabal, MD;  Location: [facility] ORS;  Service: Vascular;  Laterality: N/A;  . Insertion of iliac stent Left 02/01/2015    Procedure: INSERTION OF ILIAC STENT;  Surgeon: Katha Cabal, MD;  Location: [facility] ORS;  Service: Vascular;  Laterality: Left;  . Endarterectomy femoral Right 02/02/2015    Procedure: ENDARTERECTOMY FEMORAL;  Surgeon: Katha Cabal, MD;  Location: [facility] ORS;  Service: Vascular;  Laterality: Right;  . Insertion  of iliac stent Bilateral 02/02/2015    Procedure: INSERTION OF ILIAC STENT;  Surgeon: Katha Cabal, MD;  Location: [facility] ORS;  Service: Vascular;  Laterality: Bilateral;  . Femoral-popliteal bypass graft Left 02/02/2015    Procedure: BYPASS GRAFT FEMORAL-POPLITEAL ARTERY;  Surgeon: Katha Cabal, MD;  Location: [facility] ORS;  Service: Vascular;  Laterality: Left;    Prior to Admission medications   Medication Sig Start Date End Date Taking? Authorizing Provider  acetaminophen (TYLENOL) 500 MG tablet Take 1,000 mg by mouth every 6 (six) hours as needed for pain.   Yes Historical Provider, MD  diltiazem (CARDIZEM CD) 240 MG 24 hr capsule Take 1 capsule (240 mg total) by mouth daily. 11/23/14  Yes Wellington Hampshire, MD  fluticasone (FLONASE) 50 MCG/ACT nasal spray Place 2 sprays into the nose daily.  06/08/13  Yes Historical Provider, MD  hydrALAZINE (APRESOLINE) 10 MG tablet Take 1 tablet (10 mg total) by mouth 3 (three) times daily. 12/09/14  Yes Ryan M Dunn, PA-C  losartan (COZAAR) 100 MG tablet Take 1 tablet (100 mg total) by mouth daily. 06/13/14  Yes Wellington Hampshire, MD  mupirocin ointment (BACTROBAN) 2 %  01/05/15  Yes Historical Provider, MD  pantoprazole (PROTONIX) 40 MG tablet daily. 10/28/14  Yes Historical Provider, MD  promethazine (PHENERGAN) 12.5 MG tablet Take 1 tablet (12.5 mg total) by mouth every 8 (eight) hours as needed for nausea or vomiting. 12/20/14  Yes Jearld Fenton, NP  Ranitidine HCl (ZANTAC PO) Take by mouth daily.   Yes Historical Provider, MD  spironolactone (ALDACTONE) 25 MG tablet Take 1 tablet (25 mg total) by mouth daily. 10/14/14  Yes Wellington Hampshire, MD  warfarin (COUMADIN) 2.5 MG tablet Take 2.5 mg by mouth daily. Mon, Wed, Fri, and Sat   Yes Historical Provider, MD  albuterol-ipratropium (COMBIVENT) 18-103 MCG/ACT inhaler Inhale 2 puffs into the lungs every 6 (six) hours as needed.    Historical Provider, MD  calcium carbonate (TUMS - DOSED IN MG ELEMENTAL CALCIUM) 500 MG chewable tablet Chew 1 tablet by mouth 2 (two) times daily.    Historical Provider, MD  cyclobenzaprine (FLEXERIL) 5 MG tablet Take 1 tablet (5 mg total) by mouth 2 (two) times daily as needed for muscle spasms (sedation caution). Patient not taking: Reported on 12/26/2014 12/09/14   Tonia Ghent, MD  multivitamin-iron-minerals-folic acid (CENTRUM) chewable  tablet Chew 1 tablet by mouth daily.    Historical Provider, MD   Allergies  Allergen Reactions  . Trimethobenzamide Other (See Comments)    Eye allergy, whites of eyes turned red, for about two weeks, there after.    Marland Kitchen Amoxicillin-Pot Clavulanate Nausea And Vomiting    vomiting after 45 min of taking pill  . Ceftin [Cefuroxime Axetil] Other (See Comments)    Unknown   . Chlorthalidone     hyponatremia  . Hctz [Hydrochlorothiazide]     Hyponatremia   . Polymyxin B-Trimethoprim Itching    eyes red  itching  . Tequin [Gatifloxacin] Itching     FAMILY HISTORY   Family History  Problem Relation Age of Onset  . Diabetes Sister   . Cancer Neg Hx   . Stroke Neg Hx       SOCIAL HISTORY    reports that she has been smoking Cigarettes.  She has a 12.5 pack-year smoking history. She has never used smokeless tobacco. She reports that she drinks about 3.0 oz of alcohol per week. She reports that she does not  use illicit drugs.  Review of Systems  Unable to perform ROS: critical illness      VITAL SIGNS    Temp:  [91.9 F (33.3 C)-98.4 F (36.9 C)] 95.2 F (35.1 C) (06/06 0800) Pulse Rate:  [51-121] 55 (06/06 0800) Resp:  [12-32] 12 (06/06 0800) BP: (81-145)/(43-77) 86/48 mmHg (06/06 0819) SpO2:  [93 %-100 %] 100 % (06/06 0800) Weight:  [130 lb 4.7 oz (59.1 kg)] 130 lb 4.7 oz (59.1 kg) (06/06 0446) HEMODYNAMICS:   VENTILATOR SETTINGS:   INTAKE / OUTPUT:  Intake/Output Summary (Last 24 hours) at 02/06/15 0912 Last data filed at 02/06/15 0610  Gross per 24 hour  Intake 1173.13 ml  Output    450 ml  Net 723.13 ml       PHYSICAL EXAM   Physical Exam  Constitutional: No distress.  HENT:  Head: Normocephalic and atraumatic.  Eyes: Pupils are equal, round, and reactive to light. No scleral icterus.  Neck: Normal range of motion. Neck supple.  Cardiovascular: Normal rate and regular rhythm.   No murmur heard. Pulmonary/Chest: No respiratory distress. She  has no wheezes. She has no rales.  resp distress  Abdominal: Soft. She exhibits no distension. There is no tenderness.  Musculoskeletal: She exhibits no edema.  Neurological: She displays normal reflexes. Coordination normal.  Lethargic, on precedex  Skin: Skin is warm. No rash noted. She is not diaphoretic.       LABS   LABS:  CBC  Recent Labs Lab 02/03/15 1740 02/04/15 0526 02/06/15 0635  WBC 4.8 3.0* 5.1  HGB 9.0* 8.4* 9.1*  HCT 27.6* 25.9* 28.1*  PLT 161 150 145*   Coag's  Recent Labs Lab 01/31/15 0922  02/01/15 1753 02/02/15 2258 02/04/15 0526 02/05/15 0443 02/06/15 0635  APTT  --   < > 33 >160* 80* 107* 61*  INR 1.15  --  1.37 1.82  --   --   --   < > = values in this interval not displayed. BMET  Recent Labs Lab 02/04/15 0551 02/05/15 0443 02/06/15 0635  NA 131* 135 134*  K 3.7 3.6 3.8  CL 108 113* 112*  CO2 17* 16* 14*  BUN 21* 24* 26*  CREATININE 1.56* 1.50* 1.46*  GLUCOSE 94 96 82   Electrolytes  Recent Labs Lab 02/04/15 0526 02/04/15 0551 02/05/15 0443 02/06/15 0635  CALCIUM  --  7.0* 7.4* 7.7*  MG 1.9  --  1.9 1.9  PHOS 4.2  --  3.9 4.3   Sepsis Markers  Recent Labs Lab 02/02/15 2020 02/03/15 0159  LATICACIDVEN 2.6* 1.4   ABG  Recent Labs Lab 02/02/15 2050 02/03/15 0413  PHART 7.10* 7.41  PCO2ART 48 27*  PO2ART 69* 208*   Liver Enzymes  Recent Labs Lab 02/02/15 2258  AST 150*  ALT 50  ALKPHOS 58  BILITOT 0.8  ALBUMIN 2.6*   Cardiac Enzymes No results for input(s): TROPONINI, PROBNP in the last 168 hours. Glucose  Recent Labs Lab 02/02/15 2008  GLUCAP 156*     Recent Results (from the past 240 hour(s))  C difficile quick scan w PCR reflex [facility])     Status: None   Collection Time: 02/05/15 11:45 PM  Result Value Ref Range Status   C Diff antigen NEGATIVE  Final   C Diff toxin NEGATIVE  Final   C Diff interpretation Negative for C. difficile  Final     Current facility-administered  medications:  .  acetaminophen (TYLENOL) tablet 325-650 mg, 325-650  mg, Oral, Q4H PRN, 650 mg at 02/03/15 1345 **OR** acetaminophen (TYLENOL) suppository 325-650 mg, 325-650 mg, Rectal, Q4H PRN, Katha Cabal, MD .  acetaminophen (TYLENOL) tablet 325-650 mg, 325-650 mg, Oral, Q4H PRN **OR** acetaminophen (TYLENOL) suppository 325-650 mg, 325-650 mg, Rectal, Q4H PRN, Katha Cabal, MD .  dexmedetomidine (PRECEDEX) 400 MCG/100ML (4 mcg/mL) infusion, 0.4-1.2 mcg/kg/hr, Intravenous, Titrated, Lytle Butte, MD, Stopped at 02/06/15 0700 .  famotidine (PEPCID) IVPB 20 mg premix, 20 mg, Intravenous, Q0600, Flora Lipps, MD, 20 mg at 02/06/15 0551 .  heparin ADULT infusion 100 units/mL (25000 units/250 mL), 400 Units/hr, Intravenous, Continuous, Katha Cabal, MD, Last Rate: 4 mL/hr at 02/06/15 0800, 400 Units/hr at 02/06/15 0800 .  hydrALAZINE (APRESOLINE) injection 5 mg, 5 mg, Intravenous, Q20 Min PRN, Katha Cabal, MD .  HYDROmorphone (DILAUDID) injection 0.5 mg, 0.5 mg, Intravenous, Q2H PRN, Katha Cabal, MD .  ipratropium-albuterol (DUONEB) 0.5-2.5 (3) MG/3ML nebulizer solution 3 mL, 3 mL, Inhalation, Q6H PRN, Katha Cabal, MD, 3 mL at 02/03/15 1536 .  labetalol (NORMODYNE,TRANDATE) injection 10 mg, 10 mg, Intravenous, Q10 min PRN, Katha Cabal, MD, 10 mg at 02/03/15 2440 .  LORazepam (ATIVAN) injection 1-2 mg, 1-2 mg, Intravenous, Q2H PRN, Algernon Huxley, MD, 2 mg at 02/05/15 2342 .  LORazepam (ATIVAN) injection 2-3 mg, 2-3 mg, Intravenous, Q1H PRN, Katha Cabal, MD, 1 mg at 02/05/15 0044 .  multivitamins adult (MVI -12) 10 mL in lactated ringers 1,000 mL infusion, , Intravenous, Continuous, Flora Lipps, MD, Last Rate: 50 mL/hr at 02/06/15 0700 .  mupirocin ointment (BACTROBAN) 2 %, , Topical, BID, Katha Cabal, MD .  nicotine (NICODERM CQ - dosed in mg/24 hours) patch 14 mg, 14 mg, Transdermal, Daily, Katha Cabal, MD, 14 mg at 02/05/15 1052 .  ondansetron  (ZOFRAN) injection 4 mg, 4 mg, Intravenous, Q6H PRN, Katha Cabal, MD, 4 mg at 02/02/15 1851 .  oxyCODONE (Oxy IR/ROXICODONE) immediate release tablet 5-10 mg, 5-10 mg, Oral, Q4H PRN, Katha Cabal, MD, 10 mg at 02/04/15 0927 .  polyethylene glycol (MIRALAX / GLYCOLAX) packet 17 g, 17 g, Oral, Daily PRN, Katha Cabal, MD .  potassium chloride SA (K-DUR,KLOR-CON) CR tablet 20-40 mEq, 20-40 mEq, Oral, Daily PRN, Katha Cabal, MD  Facility-Administered Medications Ordered in Other Encounters:  .  0.9 %  sodium chloride infusion, , , Continuous PRN, Langley Gauss Justis, CRNA, Stopped at 02/05/15 1046 .  fentaNYL (SUBLIMAZE) injection, , , Anesthesia Intra-op, Denise Justis, CRNA, 50 mcg at 02/02/15 1637 .  glycopyrrolate (ROBINUL) injection, , , Anesthesia Intra-op, Denise Justis, CRNA, 0.4 mg at 02/01/15 2113 .  heparin injection, , , Anesthesia Intra-op, Denise Justis, CRNA, 2 mL at 02/02/15 1814 .  neostigmine (BLOXIVERZ) injection, , Intravenous, Anesthesia Intra-op, Denise Justis, CRNA, 3 mg at 02/01/15 2113 .  rocuronium (ZEMURON) injection, , , Anesthesia Intra-op, Denise Justis, CRNA, 50 mg at 02/02/15 1325 .  vancomycin (VANCOCIN) 1,000 mg in sodium chloride 0.9 % 250 mL IVPB, 1,000 mg, Intravenous, Continuous PRN, Denise Justis, CRNA, 1,000 mg at 02/01/15 2159  IMAGING    No results found.    Indwelling Urinary Catheter continued, requirement due to   Reason to continue Indwelling Urinary Catheter for strict Intake/Output monitoring for hemodynamic instability   Central Line continued, requirement due to   Reason to continue [employer] Monitoring of central venous pressure or other hemodynamic parameters          ASSESSMENT/PLAN  74 yo white female admitted to ICU for post op resp failure from severe metabolic acidosis from hypoperfusion and renal failure-slowly resolving  Successfully extubated, however now with signs of ETOH withdrawal  1.oxygen as  needed 2.precedex as needed 3.CIWA protocol 4.IVF's with MVI/folic acid 5.needs ICU monitoring   I have personally obtained a history, examined the patient, evaluated laboratory and imaging results, formulated the assessment and plan and placed orders.  The Patient requires high complexity decision making for assessment and support, frequent evaluation and titration of therapies, application of advanced monitoring technologies and extensive interpretation of multiple databases. Critical Care Time devoted to patient care services described in this note is 35 minutes.   Overall, patient is critically ill, prognosis is guarded.   Corrin Parker, M.D. [facility] [place] Director Intensive Care Unit   02/06/2015, 9:12 AM

## 2015-02-06 NOTE — Progress Notes (Signed)
[place] NOTE  Pharmacy Consult for Electrolyte Management Indication: ICU Status   Allergies  Allergen Reactions  . Trimethobenzamide Other (See Comments)    Eye allergy, whites of eyes turned red, for about two weeks, there after.    Marland Kitchen Amoxicillin-Pot Clavulanate Nausea And Vomiting    vomiting after 45 min of taking pill  . Ceftin [Cefuroxime Axetil] Other (See Comments)    Unknown   . Chlorthalidone     hyponatremia  . Hctz [Hydrochlorothiazide]     Hyponatremia   . Polymyxin B-Trimethoprim Itching    eyes red  itching  . Tequin [Gatifloxacin] Itching    Patient Measurements: Height: 5' (152.4 cm) Weight: 130 lb 4.7 oz (59.1 kg) IBW/kg (Calculated) : 45.5   Vital Signs: Temp: 97.2 F (36.2 C) (06/06 1100) BP: 90/47 mmHg (06/06 1100) Pulse Rate: 78 (06/06 1100) Intake/Output from previous day: 06/05 0701 - 06/06 0700 In: 1394.2 [I.V.:1394.2] Out: 450 [Urine:450] Intake/Output from this shift: Total I/O In: 500 [IV Piggyback:500] Out: -  Vent settings for last 24 hours:    Labs:  Recent Labs  02/03/15 1740 02/04/15 0526 02/04/15 0551 02/05/15 0443 02/06/15 0635  WBC 4.8 3.0*  --   --  5.1  HGB 9.0* 8.4*  --   --  9.1*  HCT 27.6* 25.9*  --   --  28.1*  PLT 161 150  --   --  145*  APTT  --  80*  --  107* 61*  CREATININE  --   --  1.56* 1.50* 1.46*  MG  --  1.9  --  1.9 1.9  PHOS  --  4.2  --  3.9 4.3   Estimated Creatinine Clearance: 26.8 mL/min (by C-G formula based on Cr of 1.46).  No results for input(s): GLUCAP in the last 72 hours.  Microbiology: Recent Results (from the past 720 hour(s))  C difficile quick scan w PCR reflex [facility])     Status: None   Collection Time: 02/05/15 11:45 PM  Result Value Ref Range Status   C Diff antigen NEGATIVE  Final   C Diff toxin NEGATIVE  Final   C Diff interpretation Negative for C. difficile  Final    Medications:  Scheduled:  . clonazepam  0.5 mg Oral BID  . famotidine  (PEPCID) IV  20 mg Intravenous Q0600  . mupirocin ointment   Topical BID  . nicotine  14 mg Transdermal Daily   Infusions:  . dexmedetomidine Stopped (02/06/15 0700)  . heparin 400 Units/hr (02/06/15 1100)  . multivitamin (MVI) IV infusion (LR or D5LR) 50 mL/hr at 02/06/15 1100    Assessment: 74 yo female post extubation now showing signs of possible alcohol withdrawl. Patient on physician managed heparin drip. Patient being managed per CIWA.    Plan:  1. Electrolytes: Electrolytes WNL. Will continue to monitor BMP with am labs. Will order magnesium and phosphorus levels as clinically indicated.   2. Heparin Drip: MD managing. Will continue to monitor CBC per protocol.    3. Delirium: As per rounds, will order morphine 2mg  IV Q2hr PRN and schedule clonazepam 0.5mg  BID. Patient continues to require Precedex and PRN lorazepam.    Pharmacy will continue to monitor and adjust per consult.    Simpson,Michael L, RPH 02/06/2015,12:05 PM

## 2015-02-06 NOTE — Progress Notes (Signed)
Attempt to place dobhoff tube per Dr. Mortimer Fries order.  Pt unable to tolerate, unable to swallow tube down, gagging, desaturation.  Buck Mam RN also attempted placement with same results.  Nofitied Dr. Mortimer Fries of pt unable to tolerate placement.  Per Dr. Mortimer Fries, do not attempt again, continue to try and arouse pt and once awake, try po.

## 2015-02-06 NOTE — Care Management Note (Signed)
Case Management Note  Patient Details  Name: [name] MRN: [id number] Date of Birth: March 27, 1941  Subjective/Objective:   Post op course complicated by acute delirium due to ETOH withdrawal. Extubated and on Precedex gtt.  CIWA protocol.  Briefly spoke with patient's sister. She indicated her brother, Jennifer Simpson is the [place]. There appeared to be some stain in that relationship. Encouraged her to have him speak further with the physician for medical concerns. Following.            Action/Plan:   Expected Discharge Date:                  Expected Discharge Plan:     In-House Referral:     Discharge planning Services  CM Consult  Post Acute Care Choice:    Choice offered to:     DME Arranged:    DME Agency:     HH Arranged:    HH Agency:     Status of Service:  In process, will continue to follow  Medicare Important Message Given:  Yes Date Medicare IM Given:  02/02/15 Medicare IM give by:  Orvan July Date Additional Medicare IM Given:  02/06/15 Additional Medicare Important Message give by:  Orvan July  If discussed at Long Length of Stay Meetings, dates discussed:    Additional Comments:  Jolly Mango, RN 02/06/2015, 1:15 PM

## 2015-02-06 NOTE — Progress Notes (Signed)
PT Cancellation Note  Patient Details Name: NOLIA TSCHANTZ MRN: [id number] DOB: 11-16-1940   Cancelled Treatment:    Reason Eval/Treat Not Completed: Fatigue/lethargy limiting ability to participate (attempted 2xs to see pt, will re-attempt in PM, time permit)  Greggory Stallion, PT, DPT [phone]  Averly Ericson 02/06/2015, 10:54 AM

## 2015-02-06 NOTE — Progress Notes (Addendum)
Precedex restarted due to mild agitation.  Pulling off clothes, Medical equipment.

## 2015-02-06 NOTE — Anesthesia Postprocedure Evaluation (Addendum)
  Anesthesia Post-op Note  Patient: Jennifer Simpson  Procedure(s) Performed: Procedure(s): ENDARTERECTOMY FEMORAL (Right) INSERTION OF ILIAC STENT (Bilateral) BYPASS GRAFT FEMORAL-POPLITEAL ARTERY (Left)  Anesthesia type:General  Patient location: PACU  Post pain: Pain level controlled  Post assessment: Post-op Vital signs reviewed, Patient's Cardiovascular Status Stable, Respiratory Function Stable, Patent Airway and No signs of Nausea or vomiting  Post vital signs: Reviewed and stable Level of consciousness: awake, alert  and patient cooperative  Complications: No apparent anesthesia complications

## 2015-02-06 NOTE — Progress Notes (Signed)
Physical Therapy Treatment Patient Details Name: Jennifer Simpson MRN: [id number] DOB: Aug 22, 1941 Today's Date: 03/05/15    History of Present Illness progressive b/l LE weakness, circulatory issues, needed sx, was extubated 6/3 AM    PT Comments    Pt is making good progress towards goals with improved endurance for exercise this date. Pt very lethargic, unable to participate in OOB mobility and required cues/assist for staying awake during there-ex. Will plan to promote mobility next date as pt status improves.   Follow Up Recommendations  SNF     Equipment Recommendations       Recommendations for Other Services       Precautions / Restrictions Precautions Precautions: Fall Restrictions Weight Bearing Restrictions: No    Mobility  Bed Mobility Overal bed mobility:  (unable at this time secondary to lethargy)                Transfers Overall transfer level:  (not able at this time)                  Ambulation/Gait                 Stairs            Wheelchair Mobility    Modified Rankin (Stroke Patients Only)       Balance                                    Cognition Arousal/Alertness: Lethargic Behavior During Therapy: Flat affect Overall Cognitive Status: Within Functional Limits for tasks assessed                      Exercises Other Exercises Other Exercises: Pt performed B LE ther-ex including quad sets, hip add squeezes, SLRs, heel slides, SAQ, and hip abd/add. All ther-ex performed 10 reps with mod assist for performance. Safe technique performed. After ther-ex, pt repositioned for comfort.    General Comments        Pertinent Vitals/Pain Pain Assessment: Faces Pain Score: 3  Faces Pain Scale: Hurts a little bit Pain Location: L LE    Home Living                      Prior Function            PT Goals (current goals can now be found in the care plan section) Acute Rehab PT  Goals Patient Stated Goal: "I'd rather go home" PT Goal Formulation: With patient Potential to Achieve Goals: Good Progress towards PT goals: Progressing toward goals    Frequency  Min 2X/week    PT Plan Current plan remains appropriate    Co-evaluation             End of Session Equipment Utilized During Treatment: Oxygen Activity Tolerance: Patient limited by fatigue Patient left: in bed;with family/visitor present     Time: 3704-8889 PT Time Calculation (min) (ACUTE ONLY): 16 min  Charges:  $Therapeutic Exercise: 8-22 mins                    G Codes:      Valari Taylor 03/05/2015, 4:38 PM  Greggory Stallion, PT, DPT [phone]

## 2015-02-06 NOTE — Progress Notes (Signed)
Pt lethargic this am, alert but confused by this afternoon.  Pt remains off precedex.  Pt able to drink and drink small bites and sips.  NSR/ST.  LUngs coarse diminished.  Small bm this shift.  Pt off warming blanket this afternoon.  VSS, afebrile.  Surgery dressings changed this shift.

## 2015-02-06 NOTE — Progress Notes (Signed)
Nutrition Follow-up  INTERVENTION:  Coordination of Care: discussed nutritional poc during CCU rounds; per MD Kasa, orders received for dobhoff placement with initiation of EN. RN attempted placement of dobhoff tube but unsuccessful. Continue diet, add supplements.  Medical Food Supplement Therapy: per Darlyn Chamber RN, pt with periods of alertness. Will add Magic Cup TID with meals. Nsg able to call for additional Magic Cups as needed. Pt previously reporting that she does not like liquid supplements  NUTRITION DIAGNOSIS:  Inadequate oral intake related to inability to eat as evidenced by NPO status. Continues but being addressed as attempted dobhoff placement, adding supplements  GOAL:  Patient will meet greater than or equal to 90% of their needs   MONITOR:   (Energy Intake, Anthropometrics, Electrolyte/Renal Profile, Digestive System)  REASON FOR ASSESSMENT:  Other (Comment), Malnutrition Screening Tool    ASSESSMENT:  Pt now with acute delerium, likely EtOH withdrawal, on CIWA protocol, on precedex drip.   Diet heart healthy/carb modified Room service appropriate?: Yes; Fluid consistency:: Thin  Energy Intake: no recorded po intake  Electrolyte and Renal Profile:  Recent Labs Lab 02/04/15 0526 02/04/15 0551 02/05/15 0443 02/06/15 0635  BUN  --  21* 24* 26*  CREATININE  --  1.56* 1.50* 1.46*  NA  --  131* 135 134*  K  --  3.7 3.6 3.8  MG 1.9  --  1.9 1.9  PHOS 4.2  --  3.9 4.3   Meds: precedex, ativan, MVI, morphine  Height:  Ht Readings from Last 1 Encounters:  02/01/15 5' (1.524 m)    Weight:  Wt Readings from Last 1 Encounters:  02/06/15 130 lb 4.7 oz (59.1 kg)   Filed Weights   02/01/15 2322 02/05/15 0456 02/06/15 0446  Weight: 95 lb 10.9 oz (43.4 kg) 127 lb 10.3 oz (57.9 kg) 130 lb 4.7 oz (59.1 kg)     BMI:  Body mass index is 25.45 kg/(m^2).  Estimated Nutritional Needs:  Kcal:  1210-1321 kcals (BEE 847, 1.3 AF, 1.1-1.2 IF) using current  wt  Protein:  43-25 g (1.0-1.2 g/kg)   Fluid:  1290-1505 mL (30-35 mL/kg)     HIGH Care Level   Kerman Passey MS, RD, LDN [phone] Pager

## 2015-02-06 NOTE — Anesthesia Postprocedure Evaluation (Deleted)
  Anesthesia Post-op Note  Patient: Jennifer Simpson  Procedure(s) Performed: Procedure(s): ENDARTERECTOMY FEMORAL (Right) INSERTION OF ILIAC STENT (Bilateral) BYPASS GRAFT FEMORAL-POPLITEAL ARTERY (Left)  Anesthesia type:General  Patient location: PACU  Post pain: Pain level controlled  Post assessment: Post-op Vital signs reviewed, Patient's Cardiovascular Status Stable, Respiratory Function Stable, Patent Airway and No signs of Nausea or vomiting  Post vital signs: Reviewed and stable  Last Vitals:  Filed Vitals:   02/06/15 1000  BP: 86/47  Pulse: 116  Temp: 35.9 C  Resp: 14    Level of consciousness: awake, alert  and patient cooperative  Complications: No apparent anesthesia complications

## 2015-02-07 ENCOUNTER — Inpatient Hospital Stay: Payer: Medicare Other

## 2015-02-07 ENCOUNTER — Encounter: Payer: Self-pay | Admitting: Vascular Surgery

## 2015-02-07 DIAGNOSIS — J9588 Other intraoperative complications of respiratory system, not elsewhere classified: Secondary | ICD-10-CM

## 2015-02-07 DIAGNOSIS — I739 Peripheral vascular disease, unspecified: Secondary | ICD-10-CM

## 2015-02-07 LAB — GLUCOSE, CAPILLARY
GLUCOSE-CAPILLARY: 101 mg/dL — AB (ref 65–99)
GLUCOSE-CAPILLARY: 62 mg/dL — AB (ref 65–99)
Glucose-Capillary: 93 mg/dL (ref 65–99)

## 2015-02-07 LAB — BASIC METABOLIC PANEL
Anion gap: 8 (ref 5–15)
BUN: 27 mg/dL — AB (ref 6–20)
CALCIUM: 8.1 mg/dL — AB (ref 8.9–10.3)
CO2: 17 mmol/L — AB (ref 22–32)
CREATININE: 1.47 mg/dL — AB (ref 0.44–1.00)
Chloride: 115 mmol/L — ABNORMAL HIGH (ref 101–111)
GFR calc Af Amer: 39 mL/min — ABNORMAL LOW (ref >60)
GFR, EST NON AFRICAN AMERICAN: 34 mL/min — AB (ref >60)
Glucose, Bld: 79 mg/dL (ref 65–99)
Potassium: 3.8 mmol/L (ref 3.5–5.1)
Sodium: 140 mmol/L (ref 135–145)

## 2015-02-07 LAB — ALBUMIN: ALBUMIN: 2.1 g/dL — AB (ref 3.5–5.0)

## 2015-02-07 LAB — APTT: APTT: 36 s (ref 24–36)

## 2015-02-07 MED ORDER — VITAL HIGH PROTEIN PO LIQD
1000.0000 mL | ORAL | Status: DC
  Administered 2015-02-07: 1000 mL

## 2015-02-07 MED ORDER — FAMOTIDINE IN NACL 20-0.9 MG/50ML-% IV SOLN: 20.0000 mg | Freq: Two times a day (BID) | INTRAVENOUS | Status: DC

## 2015-02-07 MED ORDER — FENTANYL CITRATE (PF) 100 MCG/2ML IJ SOLN
INTRAMUSCULAR | Status: AC
  Administered 2015-02-07: 100 ug via INTRAVENOUS
  Filled 2015-02-07: qty 4

## 2015-02-07 MED ORDER — FENTANYL 2500MCG IN NS 250ML (10MCG/ML) PREMIX INFUSION
50.0000 ug/h | INTRAVENOUS | Status: DC
  Administered 2015-02-07 (×2): 50 ug/h via INTRAVENOUS
  Administered 2015-02-08: 75 ug/h via INTRAVENOUS
  Filled 2015-02-07 (×2): qty 250

## 2015-02-07 MED ORDER — CLONAZEPAM 0.125 MG PO TBDP
0.1250 mg | ORAL_TABLET | Freq: Every day | ORAL | Status: DC
  Administered 2015-02-08 – 2015-02-10 (×3): 0.125 mg via ORAL
  Filled 2015-02-07 (×3): qty 1

## 2015-02-07 MED ORDER — FENTANYL CITRATE (PF) 100 MCG/2ML IJ SOLN
100.0000 ug | Freq: Once | INTRAMUSCULAR | Status: AC
Start: 2015-02-07 — End: 2015-02-07
  Administered 2015-02-07: 100 ug via INTRAVENOUS

## 2015-02-07 MED ORDER — ADULT MULTIVITAMIN LIQUID CH
5.0000 mL | Freq: Every day | ORAL | Status: DC
  Administered 2015-02-07 – 2015-02-09 (×3): 5 mL via ORAL
  Filled 2015-02-07 (×3): qty 5

## 2015-02-07 MED ORDER — VECURONIUM BROMIDE 10 MG IV SOLR
10.0000 mg | Freq: Once | INTRAVENOUS | Status: AC
  Administered 2015-02-07: 10 mg via INTRAVENOUS

## 2015-02-07 MED ORDER — CHLORHEXIDINE GLUCONATE 0.12 % MT SOLN
15.0000 mL | Freq: Two times a day (BID) | OROMUCOSAL | Status: DC
  Administered 2015-02-07 – 2015-02-09 (×4): 15 mL via OROMUCOSAL

## 2015-02-07 MED ORDER — METHYLPREDNISOLONE SODIUM SUCC 40 MG IJ SOLR
40.0000 mg | Freq: Two times a day (BID) | INTRAMUSCULAR | Status: DC
  Administered 2015-02-07 – 2015-02-10 (×6): 40 mg via INTRAVENOUS
  Filled 2015-02-07 (×5): qty 1

## 2015-02-07 MED ORDER — STERILE WATER FOR INJECTION IV SOLN
INTRAVENOUS | Status: DC
  Administered 2015-02-07: 18:00:00 via INTRAVENOUS
  Filled 2015-02-07 (×7): qty 850

## 2015-02-07 MED ORDER — TRACE MINERALS CR-CU-MN-SE-ZN 10-1000-500-60 MCG/ML IV SOLN: INTRAVENOUS | Status: DC

## 2015-02-07 MED ORDER — BUDESONIDE 0.5 MG/2ML IN SUSP
0.5000 mg | Freq: Two times a day (BID) | RESPIRATORY_TRACT | Status: DC
  Administered 2015-02-07 – 2015-02-09 (×4): 0.5 mg via RESPIRATORY_TRACT
  Filled 2015-02-07 (×5): qty 2

## 2015-02-07 MED ORDER — FREE WATER
25.0000 mL | Status: DC
  Administered 2015-02-07 – 2015-02-09 (×12): 25 mL

## 2015-02-07 MED ORDER — MIDAZOLAM HCL 2 MG/2ML IJ SOLN
INTRAMUSCULAR | Status: AC
  Administered 2015-02-07: 2 mg via INTRAVENOUS
  Filled 2015-02-07: qty 4

## 2015-02-07 MED ORDER — MIDAZOLAM HCL 2 MG/2ML IJ SOLN
2.0000 mg | Freq: Once | INTRAMUSCULAR | Status: AC
  Administered 2015-02-07: 2 mg via INTRAVENOUS

## 2015-02-07 MED ORDER — IPRATROPIUM-ALBUTEROL 0.5-2.5 (3) MG/3ML IN SOLN
3.0000 mL | RESPIRATORY_TRACT | Status: DC
  Administered 2015-02-07 – 2015-02-09 (×12): 3 mL via RESPIRATORY_TRACT
  Filled 2015-02-07 (×13): qty 3

## 2015-02-07 MED ORDER — CLINDAMYCIN PHOSPHATE 300 MG/50ML IV SOLN
300.0000 mg | Freq: Four times a day (QID) | INTRAVENOUS | Status: DC
  Administered 2015-02-07 – 2015-02-08 (×3): 300 mg via INTRAVENOUS
  Filled 2015-02-07 (×8): qty 50

## 2015-02-07 MED ORDER — MORPHINE SULFATE 2 MG/ML IJ SOLN
1.0000 mg | INTRAMUSCULAR | Status: DC | PRN
  Administered 2015-02-07: 1 mg via INTRAVENOUS

## 2015-02-07 MED ORDER — INSULIN ASPART 100 UNIT/ML ~~LOC~~ SOLN
1.0000 [IU] | SUBCUTANEOUS | Status: DC
  Administered 2015-02-08: 1 [IU] via SUBCUTANEOUS
  Administered 2015-02-08: 2 [IU] via SUBCUTANEOUS
  Administered 2015-02-08 (×3): 1 [IU] via SUBCUTANEOUS
  Administered 2015-02-09 (×2): 2 [IU] via SUBCUTANEOUS
  Administered 2015-02-09: 1 [IU] via SUBCUTANEOUS
  Administered 2015-02-09: 2 [IU] via SUBCUTANEOUS
  Filled 2015-02-07: qty 1
  Filled 2015-02-07 (×2): qty 2
  Filled 2015-02-07 (×2): qty 1
  Filled 2015-02-07: qty 2
  Filled 2015-02-07 (×2): qty 1

## 2015-02-07 MED ORDER — CETYLPYRIDINIUM CHLORIDE 0.05 % MT LIQD
7.0000 mL | Freq: Four times a day (QID) | OROMUCOSAL | Status: DC
  Administered 2015-02-07 – 2015-02-09 (×8): 7 mL via OROMUCOSAL

## 2015-02-07 MED ORDER — DOCUSATE SODIUM 50 MG/5ML PO LIQD
50.0000 mg | Freq: Two times a day (BID) | ORAL | Status: DC
  Administered 2015-02-07 – 2015-02-09 (×4): 50 mg via ORAL
  Filled 2015-02-07 (×4): qty 10

## 2015-02-07 MED ORDER — CLONAZEPAM 0.25 MG PO TBDP
0.2500 mg | ORAL_TABLET | Freq: Every day | ORAL | Status: DC
  Administered 2015-02-08: 0.25 mg via ORAL
  Filled 2015-02-07: qty 2
  Filled 2015-02-07: qty 1

## 2015-02-07 MED ORDER — FAMOTIDINE 20 MG PO TABS
20.0000 mg | ORAL_TABLET | Freq: Every day | ORAL | Status: DC
  Administered 2015-02-08 – 2015-02-09 (×2): 20 mg via ORAL
  Filled 2015-02-07 (×2): qty 1

## 2015-02-07 MED ORDER — TRACE MINERALS CR-CU-MN-SE-ZN 10-1000-500-60 MCG/ML IV SOLN
INTRAVENOUS | Status: DC
  Filled 2015-02-07: qty 720

## 2015-02-07 MED ORDER — MORPHINE SULFATE 2 MG/ML IJ SOLN
2.0000 mg | INTRAMUSCULAR | Status: DC | PRN
  Filled 2015-02-07: qty 1

## 2015-02-07 NOTE — Plan of Care (Signed)
Problem: Phase I Progression Outcomes Goal: Voiding-avoid urinary catheter unless indicated Outcome: Not Progressing Catheter in place

## 2015-02-07 NOTE — Progress Notes (Signed)
Patient was alert to lethargic early afternoon, but able to follow commands. Did have weak non-productive cough. Patient was complaining of pain in leg, was leaning over in the bed, and had bedpad bunched under her back around 1450. RN obtained prn pain medication per order, administered to patient and then with another RN's help repositioned bed pad and patient. Patient went unresponsive but eyes were still open and then O2 sats started to drop around 15400. Nasal oxygen increased, then tried venturi mask, then used bag valve mask with wall oxygen at maximum while another RN got a non-rebreather mask and notified MD (who was intubating another patient at this time) and respiratory therapy. Non-rebreather insufficient to maintain O2 sats, patient still unresponsive. Respiratory therapy tried patient on Bipap but still insufficient to maintain O2 sats. MD arrived and assess patient and spoke with patient's sister, patient listed as full code in the computer. After conferring with patient's sister, MD intubated patient. Intubated completed by 1600.

## 2015-02-07 NOTE — Progress Notes (Signed)
Two separate RNs tried to place NG tube. Both times patient would not swallow, O2 sats dropped, patient coughing, tube had to be withdrawn.

## 2015-02-07 NOTE — Progress Notes (Signed)
Patient's sister Margreta Journey called for update to set up a face to face meeting with herself, her brother, and Dr. Mortimer Fries to discuss patient. Meeting set up for 12:30 pm. Sister updated. MD requested internal medicine consult.

## 2015-02-07 NOTE — Progress Notes (Signed)
Brief Nutrition Follow-up:  Received phone call from Production assistant, radio. Pt required intubation this afternoon, currently sedated on vent, OG tube now in place. TPN ordered but has not been started. Dietitian Consult for TF received from MD Kasa, per MD, RD may also discontinue TPN orders. Recommend starting Vital High Protein at rate of 20 ml/hr.  If tolerating, will reassess needs (as pt now on vent) and recommend goal rate for TF tomorrow.  HIGH Care Level  [facility] MS, [place], LDN [phone] Pager

## 2015-02-07 NOTE — Clinical Social Work Note (Signed)
Physical therapy is recommending SNF for patient at discharge. Patient's Fl2 and pasrr were prepared in the event of a needed bedsearch. CSW full assessment to eventually follow. Shela Leff MSW,LCSWA [phone]

## 2015-02-07 NOTE — Procedures (Signed)
Endotracheal Intubation: Patient required placement of an artificial airway secondary to resp failure.   Consent: Emergent.   Hand washing performed prior to starting the procedure.   Medications administered for sedation prior to procedure: Midazolam 4 mg IV,  Vecuronium 10 mg IV, Fentanyl 100 mcg IV.   Procedure: A time out procedure was called and correct patient, name, & ID confirmed. Needed supplies and equipment were assembled and checked to include ETT, 10 ml syringe, Glidescope, Mac and Miller blades, suction, oxygen and bag mask valve, end tidal CO2 monitor. Patient was positioned to align the mouth and pharynx to facilitate visualization of the glottis.  Heart rate, SpO2 and blood pressure was continuously monitored during the procedure. Pre-oxygenation was conducted prior to intubation and endotracheal tube was placed through the vocal cords into the trachea.  During intubation an assistant applied gentle pressure to the cricoid cartilage.   The artificial airway was placed under direct visualization via glidescope route using a 8 ETT on the first attempt.    ETT was secured at 23 cm mark.    Placement was confirmed by auscuitation of lungs with good breath sounds bilaterally and no stomach sounds.  Condensation was noted on endotracheal tube.  Pulse ox 100%.  CO2 detector in place with appropriate color change.   Complications: None .   Operator: Aryon Nham.   Chest radiograph ordered and pending.   Comments: OGT placed via glidescope.  Corrin Parker, M.D. Pulmonary & [place] Director Intensive Care Unit

## 2015-02-07 NOTE — Consult Note (Signed)
PULMONARY / CRITICAL CARE MEDICINE   Name: Jennifer Simpson MRN: [id number] DOB: 12-23-1940    ADMISSION DATE:  02/01/2015      SUBJECTIVE   Extubated, on minimal oxygen therapy, now with acute delirium likely from ETOH withdrawal. Started on precedex, will need ICU monitoring Watch closely-wean off precedex today, on CIWA protocol Increased WOb, poor cough reflex    SIGNIFICANT EVENTS   1. Right common femoral endarterectomy with core matrix patch angioplasty 2. Right profunda femoris eversion endarterectomy 3. Right superficial femoral endarterectomy with core matrix patch angioplasty 4. Bilateral common iliac artery stenting using 8 mm Viabahn stents 5. Bilateral external iliac artery stenting using 7 mm Viabahn stents 6. Left femoral to below knee popliteal bypass using in situ great saphenous vein    PAST MEDICAL HISTORY    :  Past Medical History  Diagnosis Date  . Asthma   . Peripheral vascular disease     a. h/o iliac stents 2010. b. RAS as below.  . History of skin ulcer   . Hypertension   . GERD (gastroesophageal reflux disease)   . History of stomach ulcers   . Hyponatremia     Previously related to HCTZ/chlorthalidone  . Clotting disorder     left leg  . Chronic kidney disease   . Erythrocytosis 01/15/2015  . Lower leg DVT (deep venous thrombosis) 01/15/2015  . Collagen vascular disease   . Squamous cell carcinoma of oropharynx 2008     status post chemotherapy and radiation therapy in 2008  . Cancer of esophagus 2008    s/p chemo and radiation   Past Surgical History  Procedure Laterality Date  . Peripheral arterial stent graft Bilateral     x2 STENTS BOTH LEGS  . Anterior cervical decomp/discectomy fusion  ~ 2006    "put in spacer" (12/02/2012)  . Portacath placement  2008  . Anterior cervical decomp/discectomy fusion  10/07/2012    Procedure: ANTERIOR CERVICAL DECOMPRESSION/DISCECTOMY FUSION 2 LEVEL/HARDWARE REMOVAL;  Surgeon: Ophelia Charter,  MD;  Location: [place] [facility];  Service: Neurosurgery;  Laterality: N/A;  Cervical three-four, cervical four-five Anterior cervical decompression/diskectomy/fusion/interbody plating and prosthesis/removal of spinal concept plate  . Cataract extraction w/ intraocular lens  implant, bilateral  ~2005  . Renal artery stent Right 12/02/2012    Successful angioplasty and bare-metal stent placement to the ostial right renal artery  . Tonsillectomy  ~ 1946  . Breast cyst excision Right ?1980's    negative  . Renal angiogram N/A 12/02/2012    Procedure: RENAL ANGIOGRAM;  Surgeon: [place], MD;  Location: [facility] CATH LAB;  Service: Cardiovascular;  Laterality: N/A;  . Peripheral vascular catheterization Right 01/31/2015    Procedure: Lower Extremity Angiography;  Surgeon: Katha Cabal, MD;  Location: [place] CV LAB;  Service: Cardiovascular;  Laterality: Right;  . Peripheral vascular catheterization  01/31/2015    Procedure: Lower Extremity Intervention;  Surgeon: Katha Cabal, MD;  Location: [place] CV LAB;  Service: Cardiovascular;;  . Endarterectomy femoral N/A 02/01/2015    Procedure: ENDARTERECTOMY FEMORAL;  Surgeon: Katha Cabal, MD;  Location: [facility] ORS;  Service: Vascular;  Laterality: N/A;  . Insertion of iliac stent Left 02/01/2015    Procedure: INSERTION OF ILIAC STENT;  Surgeon: Katha Cabal, MD;  Location: [facility] ORS;  Service: Vascular;  Laterality: Left;  . Endarterectomy femoral Right 02/02/2015    Procedure: ENDARTERECTOMY FEMORAL;  Surgeon: Katha Cabal, MD;  Location: [facility] ORS;  Service: Vascular;  Laterality: Right;  . Insertion of iliac stent Bilateral 02/02/2015    Procedure: INSERTION OF ILIAC STENT;  Surgeon: Katha Cabal, MD;  Location: [facility] ORS;  Service: Vascular;  Laterality: Bilateral;  . Femoral-popliteal bypass graft Left 02/02/2015    Procedure: BYPASS GRAFT FEMORAL-POPLITEAL ARTERY;  Surgeon: Katha Cabal, MD;  Location: [facility] ORS;  Service:  Vascular;  Laterality: Left;   Prior to Admission medications   Medication Sig Start Date End Date Taking? Authorizing Provider  acetaminophen (TYLENOL) 500 MG tablet Take 1,000 mg by mouth every 6 (six) hours as needed for pain.   Yes Historical Provider, MD  diltiazem (CARDIZEM CD) 240 MG 24 hr capsule Take 1 capsule (240 mg total) by mouth daily. 11/23/14  Yes [place], MD  fluticasone (FLONASE) 50 MCG/ACT nasal spray Place 2 sprays into the nose daily.  06/08/13  Yes Historical Provider, MD  hydrALAZINE (APRESOLINE) 10 MG tablet Take 1 tablet (10 mg total) by mouth 3 (three) times daily. 12/09/14  Yes Ryan M Dunn, PA-C  losartan (COZAAR) 100 MG tablet Take 1 tablet (100 mg total) by mouth daily. 06/13/14  Yes [place], MD  mupirocin ointment (BACTROBAN) 2 %  01/05/15  Yes Historical Provider, MD  pantoprazole (PROTONIX) 40 MG tablet daily. 10/28/14  Yes Historical Provider, MD  promethazine (PHENERGAN) 12.5 MG tablet Take 1 tablet (12.5 mg total) by mouth every 8 (eight) hours as needed for nausea or vomiting. 12/20/14  Yes Jearld Fenton, NP  Ranitidine HCl (ZANTAC PO) Take by mouth daily.   Yes Historical Provider, MD  spironolactone (ALDACTONE) 25 MG tablet Take 1 tablet (25 mg total) by mouth daily. 10/14/14  Yes [place], MD  warfarin (COUMADIN) 2.5 MG tablet Take 2.5 mg by mouth daily. Mon, Wed, Fri, and Sat   Yes Historical Provider, MD  albuterol-ipratropium (COMBIVENT) 18-103 MCG/ACT inhaler Inhale 2 puffs into the lungs every 6 (six) hours as needed.    Historical Provider, MD  calcium carbonate (TUMS - DOSED IN MG ELEMENTAL CALCIUM) 500 MG chewable tablet Chew 1 tablet by mouth 2 (two) times daily.    Historical Provider, MD  cyclobenzaprine (FLEXERIL) 5 MG tablet Take 1 tablet (5 mg total) by mouth 2 (two) times daily as needed for muscle spasms (sedation caution). Patient not taking: Reported on 12/26/2014 12/09/14   Tonia Ghent, MD   multivitamin-iron-minerals-folic acid (CENTRUM) chewable tablet Chew 1 tablet by mouth daily.    Historical Provider, MD   Allergies  Allergen Reactions  . Trimethobenzamide Other (See Comments)    Eye allergy, whites of eyes turned red, for about two weeks, there after.    Marland Kitchen Amoxicillin-Pot Clavulanate Nausea And Vomiting    vomiting after 45 min of taking pill  . Ceftin [Cefuroxime Axetil] Other (See Comments)    Unknown   . Chlorthalidone     hyponatremia  . Hctz [Hydrochlorothiazide]     Hyponatremia   . Polymyxin B-Trimethoprim Itching    eyes red  itching  . Tequin [Gatifloxacin] Itching     FAMILY HISTORY   Family History  Problem Relation Age of Onset  . Diabetes Sister   . Cancer Neg Hx   . Stroke Neg Hx       SOCIAL HISTORY    reports that she has been smoking Cigarettes.  She has a 12.5 pack-year smoking history. She has never used smokeless tobacco. She reports that she drinks about 3.0 oz of alcohol per week. She  reports that she does not use illicit drugs.  Review of Systems  Unable to perform ROS: critical illness      VITAL SIGNS    Temp:  [97.3 F (36.3 C)-98.4 F (36.9 C)] 97.5 F (36.4 C) (06/07 1231) Pulse Rate:  [68-120] 97 (06/07 1231) Resp:  [11-28] 20 (06/07 1231) BP: (89-135)/(43-96) 117/54 mmHg (06/07 1231) SpO2:  [95 %-100 %] 95 % (06/07 1231) Weight:  [125 lb 10.6 oz (57 kg)] 125 lb 10.6 oz (57 kg) (06/07 0406) HEMODYNAMICS:   VENTILATOR SETTINGS:   INTAKE / OUTPUT:  Intake/Output Summary (Last 24 hours) at 02/07/15 1403 Last data filed at 02/07/15 1000  Gross per 24 hour  Intake 1639.6 ml  Output    800 ml  Net  839.6 ml       PHYSICAL EXAM   Physical Exam  Constitutional: She appears distressed.  HENT:  Head: Normocephalic and atraumatic.  Eyes: Pupils are equal, round, and reactive to light. No scleral icterus.  Neck: Normal range of motion. Neck supple.  Cardiovascular: Normal rate and regular rhythm.    No murmur heard. Pulmonary/Chest: No respiratory distress. She has no wheezes. She has no rales.  resp distress  Abdominal: Soft. She exhibits no distension. There is no tenderness.  Musculoskeletal: She exhibits no edema.  Neurological: She displays normal reflexes. Coordination normal.  Lethargic, on precedex  Skin: Skin is warm. No rash noted. She is diaphoretic.       LABS   LABS:  CBC  Recent Labs Lab 02/03/15 1740 02/04/15 0526 02/06/15 0635  WBC 4.8 3.0* 5.1  HGB 9.0* 8.4* 9.1*  HCT 27.6* 25.9* 28.1*  PLT 161 150 145*   Coag's  Recent Labs Lab 02/01/15 1753 02/02/15 2258  02/05/15 0443 02/06/15 0635 02/07/15 0638  APTT 33 >160*  < > 107* 61* 36  INR 1.37 1.82  --   --   --   --   < > = values in this interval not displayed. BMET  Recent Labs Lab 02/05/15 0443 02/06/15 0635 02/07/15 0638  NA 135 134* 140  K 3.6 3.8 3.8  CL 113* 112* 115*  CO2 16* 14* 17*  BUN 24* 26* 27*  CREATININE 1.50* 1.46* 1.47*  GLUCOSE 96 82 79   Electrolytes  Recent Labs Lab 02/04/15 0526  02/05/15 0443 02/06/15 0635 02/07/15 7026  CALCIUM  --   < > 7.4* 7.7* 8.1*  MG 1.9  --  1.9 1.9  --   PHOS 4.2  --  3.9 4.3  --   < > = values in this interval not displayed. Sepsis Markers  Recent Labs Lab 02/02/15 2020 02/03/15 0159  LATICACIDVEN 2.6* 1.4   ABG  Recent Labs Lab 02/02/15 2050 02/03/15 0413  PHART 7.10* 7.41  PCO2ART 48 27*  PO2ART 69* 208*   Liver Enzymes  Recent Labs Lab 02/02/15 2258 02/07/15 0638  AST 150*  --   ALT 50  --   ALKPHOS 58  --   BILITOT 0.8  --   ALBUMIN 2.6* 2.1*   Cardiac Enzymes No results for input(s): TROPONINI, PROBNP in the last 168 hours. Glucose  Recent Labs Lab 02/02/15 2008  GLUCAP 156*     Recent Results (from the past 240 hour(s))  C difficile quick scan w PCR reflex [facility])     Status: None   Collection Time: 02/05/15 11:45 PM  Result Value Ref Range Status   C Diff antigen NEGATIVE  Final  C Diff toxin NEGATIVE  Final   C Diff interpretation Negative for C. difficile  Final     Current facility-administered medications:  .  Marland KitchenTPN (CLINIMIX-E) Adult, , Intravenous, Continuous TPN, Flora Lipps, MD .  acetaminophen (TYLENOL) tablet 325-650 mg, 325-650 mg, Oral, Q4H PRN, 650 mg at 02/03/15 1345 **OR** acetaminophen (TYLENOL) suppository 325-650 mg, 325-650 mg, Rectal, Q4H PRN, Katha Cabal, MD .  acetaminophen (TYLENOL) tablet 325-650 mg, 325-650 mg, Oral, Q4H PRN **OR** acetaminophen (TYLENOL) suppository 325-650 mg, 325-650 mg, Rectal, Q4H PRN, Katha Cabal, MD .  Derrill Memo ON 02/08/2015] clonazepam (KLONOPIN) disintegrating tablet 0.125 mg, 0.125 mg, Oral, Daily, Flora Lipps, MD .  Derrill Memo ON 02/08/2015] clonazepam (KLONOPIN) disintegrating tablet 0.25 mg, 0.25 mg, Oral, QHS, Flora Lipps, MD .  dexmedetomidine (PRECEDEX) 400 MCG/100ML (4 mcg/mL) infusion, 0.4-1.2 mcg/kg/hr, Intravenous, Titrated, Lytle [place], MD, Stopped at 02/07/15 0900 .  famotidine (PEPCID) IVPB 20 mg premix, 20 mg, Intravenous, Q0600, Flora Lipps, MD, 20 mg at 02/07/15 0532 .  heparin ADULT infusion 100 units/mL (25000 units/250 mL), 400 Units/hr, Intravenous, Continuous, Katha Cabal, MD, Last Rate: 4 mL/hr at 02/07/15 0700, 400 Units/hr at 02/07/15 0700 .  hydrALAZINE (APRESOLINE) injection 5 mg, 5 mg, Intravenous, Q20 Min PRN, Katha Cabal, MD .  ipratropium-albuterol (DUONEB) 0.5-2.5 (3) MG/3ML nebulizer solution 3 mL, 3 mL, Inhalation, Q6H PRN, Katha Cabal, MD, 3 mL at 02/07/15 1403 .  labetalol (NORMODYNE,TRANDATE) injection 10 mg, 10 mg, Intravenous, Q10 min PRN, Katha Cabal, MD, 10 mg at 02/03/15 3825 .  LORazepam (ATIVAN) injection 1-2 mg, 1-2 mg, Intravenous, Q2H PRN, Algernon Huxley, MD, 2 mg at 02/06/15 1800 .  LORazepam (ATIVAN) injection 2-3 mg, 2-3 mg, Intravenous, Q1H PRN, Katha Cabal, MD, 2 mg at 02/07/15 0317 .  morphine 2 MG/ML injection 1 mg, 1 mg, Intravenous,  Q2H PRN, Flora Lipps, MD .  morphine 2 MG/ML injection 2 mg, 2 mg, Intravenous, Q2H PRN, Flora Lipps, MD .  mupirocin ointment (BACTROBAN) 2 %, , Topical, BID, Katha Cabal, MD, 1 application at [birth year] [phone] .  nicotine (NICODERM CQ - dosed in mg/24 hours) patch 14 mg, 14 mg, Transdermal, Daily, Katha Cabal, MD, 14 mg at 02/07/15 9379 .  ondansetron (ZOFRAN) injection 4 mg, 4 mg, Intravenous, Q6H PRN, Katha Cabal, MD, 4 mg at 02/02/15 1851 .  oxyCODONE (Oxy IR/ROXICODONE) immediate release tablet 5-10 mg, 5-10 mg, Oral, Q4H PRN, Katha Cabal, MD, 10 mg at 02/04/15 0240  Facility-Administered Medications Ordered in Other Encounters:  .  0.9 %  sodium chloride infusion, , , Continuous PRN, Langley Gauss Justis, CRNA, Stopped at 02/05/15 1046 .  fentaNYL (SUBLIMAZE) injection, , , Anesthesia Intra-op, Denise Justis, CRNA, 50 mcg at 02/02/15 1637 .  glycopyrrolate (ROBINUL) injection, , , Anesthesia Intra-op, Denise Justis, CRNA, 0.4 mg at 02/01/15 2113 .  heparin injection, , , Anesthesia Intra-op, Denise Justis, CRNA, 2 mL at 02/02/15 1814 .  neostigmine (BLOXIVERZ) injection, , Intravenous, Anesthesia Intra-op, Denise Justis, CRNA, 3 mg at 02/01/15 2113 .  rocuronium (ZEMURON) injection, , , Anesthesia Intra-op, Denise Justis, CRNA, 50 mg at 02/02/15 1325 .  vancomycin (VANCOCIN) 1,000 mg in sodium chloride 0.9 % 250 mL IVPB, 1,000 mg, Intravenous, Continuous PRN, Denise Justis, CRNA, 1,000 mg at 02/01/15 2159  IMAGING    No results found.    Indwelling Urinary Catheter continued, requirement due to   Reason to continue Indwelling Urinary Catheter for strict Intake/Output monitoring  for hemodynamic instability   Central Line continued, requirement due to   Reason to continue [employer] of central venous pressure or other hemodynamic parameters          ASSESSMENT/PLAN    74 yo white female admitted to ICU for post op resp failure from severe metabolic  acidosis from hypoperfusion and renal failure-slowly resolving  Successfully extubated, however now with signs of ETOH withdrawal, delirium from pain  and signs of aspiration  1.oxygen as needed 2.precedex as needed 3.CIWA protocol 4.IVF's with MVI/folic acid 5.needs ICU monitoring 6.start dounebs and pulmicort nebs  I have discussed with family regarding prognosis and supportive care, We have discussed that patient WOULD NOT want extreme measures including chest compressions or being placed back on vent.   AT this time, Patient is now DNR/DNI.    I have personally obtained a history, examined the patient, evaluated laboratory and imaging results, formulated the assessment and plan and placed orders.  The Patient requires high complexity decision making for assessment and support, frequent evaluation and titration of therapies, application of advanced monitoring technologies and extensive interpretation of multiple databases. Critical Care Time devoted to patient care services described in this note is 45 minutes.   Overall, patient is critically ill, prognosis is guarded.   Corrin Parker, M.D. Pulmonary & [place] Director Intensive Care Unit   02/07/2015, 2:03 PM

## 2015-02-07 NOTE — Progress Notes (Signed)
[facility]  Daily Progress Note   Subjective  - 5,6 Days Post-Op  Now intubated and on vent secondary to pneumonia  Objective Filed Vitals:   02/07/15 1700 02/07/15 1800 02/07/15 1900 02/07/15 2001  BP: 151/67 150/67 143/67   Pulse: 108 108 103   Temp:   97.7 F (36.5 C)   TempSrc:   Oral   Resp: 18 18 18    Height:      Weight:      SpO2: 100% 99% 100% 100%    Intake/Output Summary (Last 24 hours) at 02/07/15 2050 Last data filed at 02/07/15 1818  Gross per 24 hour  Intake 1191.43 ml  Output    850 ml  Net 341.43 ml    PULM  ronchi bilateral  CV  No JVD, RRR Abd      Non distended, nontender VASC  Feet with Doppler signals bilaterally good pulse in the graft at the knee level             Moderate lymphatic drainage from the left groin  Laboratory CBC    Component Value Date/Time   WBC 5.1 02/06/2015 0635   WBC 5.2 09/14/2014 1426   WBC CANCELED 03/18/2013 1603   HGB 9.1* 02/06/2015 0635   HGB 13.4 09/14/2014 1426   HCT 28.1* 02/06/2015 0635   HCT 45.7 12/08/2014 1310   PLT 145* 02/06/2015 0635   PLT 240 09/14/2014 1426    BMET    Component Value Date/Time   NA 140 02/07/2015 0638   NA 133* 10/20/2014 1603   NA 131* 04/22/2013 1546   K 3.8 02/07/2015 0638   K 4.0 04/22/2013 1546   CL 115* 02/07/2015 0638   CL 100 04/22/2013 1546   CO2 17* 02/07/2015 0638   CO2 23 04/22/2013 1546   GLUCOSE 79 02/07/2015 0638   GLUCOSE 99 10/20/2014 1603   GLUCOSE 82 04/22/2013 1546   BUN 27* 02/07/2015 0638   BUN 13 10/20/2014 1603   BUN 21* 04/22/2013 1546   CREATININE 1.47* 02/07/2015 0638   CREATININE 1.34* 09/14/2014 1426   CALCIUM 8.1* 02/07/2015 0638   CALCIUM 8.0* 09/14/2014 1426   GFRNONAA 34* 02/07/2015 0638   GFRNONAA 44* 01/12/2014 1420   GFRAA 39* 02/07/2015 0638   GFRAA 51* 01/12/2014 1420    Assessment/Planning: POD #5,6 s/p femoral endarterectomies bilateral with bilateral iliac stenting and left fem BK pop  bypass   Continue to treat the DT's, now intubated   Will paint the left groin with betadine bid asked to begin this again today  Not really able to participate in PT  Continue ICU as she is critically ill    Katha Cabal  02/07/2015, 8:50 PM

## 2015-02-07 NOTE — Progress Notes (Addendum)
Nutrition Follow-up  INTERVENTION:  Coordination of Care: failed dobhoff placement yesterday, attempted NG placement today, unsuccessful. Pt with central line access. Received verbal order from MD Kasa to start TPN as pt with inadequate intake since admission, likely continued inadequate po intake as pt currently going through DTs. PN: recommend 5%AA/20%Dextrose at rate of 30 ml/hr with goal rate of 55 ml/hr providing 1320 mL of fluid, 66 g of protein and 1162 kcals. Recommend addition of 20% lipids weekly if lipid panel wdl. Recommend checking TPN panel, pharmacy consult for electrolyte and glucose management. Recommend checking FSBS q 6 hours. TPN meets >90% of calorie and protein needs.   NUTRITION DIAGNOSIS:  Inadequate oral intake related to acute illness as evidenced by meal completion < 25%. Being addressed as starting TPN    GOAL:  Patient will meet greater than or equal to 90% of their needs, tolerance of TPN    MONITOR:   (PN, Energy Intake, Anthropometrics, Electrolyte/Renal Profile, Glucose Profile, Urine Volume)  REASON FOR ASSESSMENT:  Consult New TPN/TNA  ASSESSMENT:  Pt remains on CIWA, lethargic this AM  Diet Order: Diet heart healthy/carb modified Room service appropriate?: Yes; Fluid consistency:: Thin  Energy Intake: pt ate bite of Magic Cup this AM; poor intake since admission; NPO or on liquid diet until 6/4. Bites only on Solid diet since.    Electrolyte and Renal Profile:  Recent Labs Lab 02/04/15 0526  02/05/15 0443 02/06/15 0635 02/07/15 0638  BUN  --   < > 24* 26* 27*  CREATININE  --   < > 1.50* 1.46* 1.47*  NA  --   < > 135 134* 140  K  --   < > 3.6 3.8 3.8  MG 1.9  --  1.9 1.9  --   PHOS 4.2  --  3.9 4.3  --   < > = values in this interval not displayed.  Glucose Profile: No results for input(s): GLUCAP in the last 72 hours.   Protein Profile:  Recent Labs Lab 02/02/15 2258 02/07/15 0638  ALBUMIN 2.6* 2.1*   Meds: IV MVI,  precedex, morphine, ativan, ss insulin  Height:  Ht Readings from Last 1 Encounters:  02/01/15 5' (1.524 m)    Weight:  Wt Readings from Last 1 Encounters:  02/07/15 125 lb 10.6 oz (57 kg)    Filed Weights   02/05/15 0456 02/06/15 0446 02/07/15 0406  Weight: 127 lb 10.3 oz (57.9 kg) 130 lb 4.7 oz (59.1 kg) 125 lb 10.6 oz (57 kg)     BMI:  Body mass index is 24.54 kg/(m^2).  Estimated Nutritional Needs:  Kcal:  1302-1540 kcals (BEE 987, 1.2 AF, 1.1-1.2 IF) using current wt of 57 kg  Protein:  57-68 g (1.0-1.2 g/kg) using current wt of 57 kg  Fluid:  1425-1710 mL (25-30 ml/kg) using current wt of 57 kg  Skin:  Stage II pressure ulcer (6/3)   Intake/Output Summary (Last 24 hours) at 02/07/15 1336 Last data filed at 02/07/15 1000  Gross per 24 hour  Intake 1639.6 ml  Output    800 ml  Net  839.6 ml   HIGH Care Level  Kerman Passey MS, RD, LDN [phone] Pager

## 2015-02-08 ENCOUNTER — Encounter: Payer: Self-pay | Admitting: Vascular Surgery

## 2015-02-08 DIAGNOSIS — J690 Pneumonitis due to inhalation of food and vomit: Secondary | ICD-10-CM

## 2015-02-08 DIAGNOSIS — J441 Chronic obstructive pulmonary disease with (acute) exacerbation: Secondary | ICD-10-CM

## 2015-02-08 LAB — COMPREHENSIVE METABOLIC PANEL
ALT: 35 U/L (ref 14–54)
ANION GAP: 11 (ref 5–15)
AST: 25 U/L (ref 15–41)
Albumin: 2 g/dL — ABNORMAL LOW (ref 3.5–5.0)
Alkaline Phosphatase: 51 U/L (ref 38–126)
BUN: 28 mg/dL — ABNORMAL HIGH (ref 6–20)
CHLORIDE: 110 mmol/L (ref 101–111)
CO2: 22 mmol/L (ref 22–32)
Calcium: 8.3 mg/dL — ABNORMAL LOW (ref 8.9–10.3)
Creatinine, Ser: 1.47 mg/dL — ABNORMAL HIGH (ref 0.44–1.00)
GFR calc Af Amer: 39 mL/min — ABNORMAL LOW (ref >60)
GFR calc non Af Amer: 34 mL/min — ABNORMAL LOW (ref >60)
Glucose, Bld: 134 mg/dL — ABNORMAL HIGH (ref 65–99)
Potassium: 4 mmol/L (ref 3.5–5.1)
Sodium: 143 mmol/L (ref 135–145)
TOTAL PROTEIN: 4.3 g/dL — AB (ref 6.5–8.1)
Total Bilirubin: 0.8 mg/dL (ref 0.3–1.2)

## 2015-02-08 LAB — CBC
HEMATOCRIT: 24.6 % — AB (ref 35.0–47.0)
HEMOGLOBIN: 8.1 g/dL — AB (ref 12.0–16.0)
MCH: 33.3 pg (ref 26.0–34.0)
MCHC: 32.8 g/dL (ref 32.0–36.0)
MCV: 101.6 fL — ABNORMAL HIGH (ref 80.0–100.0)
PLATELETS: 162 10*3/uL (ref 150–440)
RBC: 2.42 MIL/uL — AB (ref 3.80–5.20)
RDW: 20.8 % — AB (ref 11.5–14.5)
WBC: 6 10*3/uL (ref 3.6–11.0)

## 2015-02-08 LAB — GLUCOSE, CAPILLARY
Glucose-Capillary: 121 mg/dL — ABNORMAL HIGH (ref 65–99)
Glucose-Capillary: 143 mg/dL — ABNORMAL HIGH (ref 65–99)
Glucose-Capillary: 147 mg/dL — ABNORMAL HIGH (ref 65–99)
Glucose-Capillary: 149 mg/dL — ABNORMAL HIGH (ref 65–99)
Glucose-Capillary: 157 mg/dL — ABNORMAL HIGH (ref 65–99)
Glucose-Capillary: 171 mg/dL — ABNORMAL HIGH (ref 65–99)

## 2015-02-08 LAB — BLOOD GAS, ARTERIAL
ACID-BASE DEFICIT: 14.6 mmol/L — AB (ref 0.0–2.0)
ACID-BASE DEFICIT: 1.7 mmol/L (ref 0.0–2.0)
ALLENS TEST (PASS/FAIL): POSITIVE — AB
Allens test (pass/fail): POSITIVE — AB
BICARBONATE: 23 meq/L (ref 21.0–28.0)
Bicarbonate: 12.7 mEq/L — ABNORMAL LOW (ref 21.0–28.0)
FIO2: 100 %
FIO2: 50 %
MECHVT: 500 mL
O2 SAT: 97 %
O2 Saturation: 97.3 %
PATIENT TEMPERATURE: 37
PATIENT TEMPERATURE: 37
PCO2 ART: 34 mmHg (ref 32.0–48.0)
PCO2 ART: 38 mmHg (ref 32.0–48.0)
PEEP/CPAP: 5 cmH2O
PEEP: 5 cmH2O
PO2 ART: 111 mmHg — AB (ref 83.0–108.0)
RATE: 18 resp/min
RATE: 18 resp/min
VT: 500 mL
pH, Arterial: 7.18 — CL (ref 7.350–7.450)
pH, Arterial: 7.39 (ref 7.350–7.450)
pO2, Arterial: 95 mmHg (ref 83.0–108.0)

## 2015-02-08 LAB — MAGNESIUM: MAGNESIUM: 1.6 mg/dL — AB (ref 1.7–2.4)

## 2015-02-08 LAB — APTT: APTT: 41 s — AB (ref 24–36)

## 2015-02-08 LAB — PHOSPHORUS: Phosphorus: 4.3 mg/dL (ref 2.5–4.6)

## 2015-02-08 MED ORDER — CLINDAMYCIN PHOSPHATE 600 MG/50ML IV SOLN
600.0000 mg | Freq: Three times a day (TID) | INTRAVENOUS | Status: DC
  Administered 2015-02-08 – 2015-02-09 (×3): 600 mg via INTRAVENOUS
  Filled 2015-02-08 (×8): qty 50

## 2015-02-08 MED ORDER — MAGNESIUM SULFATE 2 GM/50ML IV SOLN
2.0000 g | Freq: Once | INTRAVENOUS | Status: AC
  Administered 2015-02-08: 2 g via INTRAVENOUS
  Filled 2015-02-08: qty 50

## 2015-02-08 MED ORDER — VITAL HIGH PROTEIN PO LIQD
1000.0000 mL | ORAL | Status: DC
  Administered 2015-02-08: 1000 mL

## 2015-02-08 MED ORDER — RISAQUAD PO CAPS
2.0000 | ORAL_CAPSULE | Freq: Every day | ORAL | Status: DC
  Administered 2015-02-08 – 2015-02-09 (×2): 2 via ORAL
  Filled 2015-02-08 (×2): qty 2

## 2015-02-08 NOTE — Progress Notes (Signed)
Physical Therapy Treatment Patient Details Name: Jennifer Simpson MRN: [id number] DOB: [birth year] Today's Date: 02/08/2015    History of Present Illness progressive b/l LE weakness, circulatory issues, needed sx, was extubated 6/3 AM    PT Comments    Pt agreeable to PT through head nod. Pt lethargic/sedated. Pt participates in bed exercises; noted greater active movement in RLE; however, BLE require active assistive range of motion.   Follow Up Recommendations  SNF     Equipment Recommendations       Recommendations for Other Services       Precautions / Restrictions Precautions Precautions: Fall Restrictions Weight Bearing Restrictions: No    Mobility  Bed Mobility               General bed mobility comments: Not attempted due to level of sedation  Transfers                    Ambulation/Gait                 Stairs            Wheelchair Mobility    Modified Rankin (Stroke Patients Only)       Balance                                    Cognition Arousal/Alertness: Lethargic;Suspect due to medications Behavior During Therapy: Flat affect (opens/closes eyes; responds to yes/no question with head nod) Overall Cognitive Status: Difficult to assess                      Exercises General Exercises - Lower Extremity Ankle Circles/Pumps: AAROM;Both;20 reps;Supine Short Arc Quad: AAROM;Both;20 reps;Supine Heel Slides: AAROM;Both;20 reps;Supine Hip ABduction/ADduction: AAROM;Both;20 reps;Supine Straight Leg Raises: AAROM;Both;10 reps;Supine    General Comments        Pertinent Vitals/Pain Pain Assessment:  (uanble to speak, but shakes head no to pain question)    Home Living                      Prior Function            PT Goals (current goals can now be found in the care plan section) Progress towards PT goals: Progressing toward goals    Frequency  Min 2X/week    PT Plan Current  plan remains appropriate    Co-evaluation             End of Session   Activity Tolerance: No increased pain;Patient tolerated treatment well Patient left: in bed;with bed alarm set     Time: 1131-1147 PT Time Calculation (min) (ACUTE ONLY): 16 min  Charges:  $Therapeutic Exercise: 8-22 mins                    G Codes:      Charlaine Dalton 02/08/2015, 11:59 AM

## 2015-02-08 NOTE — Progress Notes (Signed)
   02/07/15 1530  Clinical Encounter Type  Visited With Patient;Family  Visit Type Initial  Referral From Nurse  Consult/Referral To Chaplain  Spiritual Encounters  Spiritual Needs Prayer;Emotional  Stress Factors  Family Stress Factors Health changes  Chaplain rounded in unit and the nurse informed me that family was present in waiting room. Provided support as applicable and offered prayer to patient. Chaplain Tocara Mennen A. Murel Shenberger Ext. [phone]

## 2015-02-08 NOTE — Progress Notes (Signed)
Pt remains on vent. Vent settings as charted.  Sedated with fentanyl. VSS. Foley in place. UOp 258ml.Tube feeding is in progress.tolerating good.  One small Bm this shift. Resting in bed without any distress.

## 2015-02-08 NOTE — Progress Notes (Signed)
   02/08/15 1059  Clinical Encounter Type  Visited With Patient and family together  Visit Type Follow-up  Consult/Referral To Chaplain  Spiritual Encounters  Spiritual Needs Prayer;Emotional  Stress Factors  Family Stress Factors Health changes  Chaplain rounded in unit and went in to visit with patient and sister. Provided a compassionate presence, prayer and support. Chaplain Samarrah Tranchina A. Khaled Herda Ext. [phone]

## 2015-02-08 NOTE — Progress Notes (Signed)
NUTRITION FOLLOW-UP  INTERVENTION:  EN: recommend titrating to goal rate of 48 ml/hr providing 1152 kcals, 101 g of protein, 967 ml of free water. Currently receiving additional 150 ml of free water via flush. Continue to assess  NUTRITION DIAGNOSIS:  Inadequate oral intake related to acute illness as evidenced by meal completion < 25%. Being addressed via TF  GOAL:  Patient will meet greater than or equal to 90% of their needs  EN: tolerance of TF at goal rate    MONITOR:   (EN, Energy Intake, Anthropometrics, Digestive System, Electrolyte/Renal Profile, Glucose Profile)  ASSESSMENT:  Pt remains on vent  EN: tolerance Vital High Protein at rate of 20 ml/hr with 25 ml free water q 4 hours  Digestive system: no signs/symptoms of GI intolerance, +small BM on night shift  Electrolyte and Renal Profile:  Recent Labs Lab 02/05/15 0443 02/06/15 0635 02/07/15 0638 02/08/15 0459  BUN 24* 26* 27* 28*  CREATININE 1.50* 1.46* 1.47* 1.47*  NA 135 134* 140 143  K 3.6 3.8 3.8 4.0  MG 1.9 1.9  --  1.6*  PHOS 3.9 4.3  --  4.3   Glucose Profile:  Recent Labs  02/08/15 0455 02/08/15 0729 02/08/15 1157  GLUCAP 121* 143* 149*   Nutritional Anemia Profile:  CBC Latest Ref Rng 02/08/2015 02/06/2015 02/04/2015  WBC 3.6 - 11.0 K/uL 6.0 5.1 3.0(L)  Hemoglobin 12.0 - 16.0 g/dL 8.1(L) 9.1(L) 8.4(L)  Hematocrit 35.0 - 47.0 % 24.6(L) 28.1(L) 25.9(L)  Platelets 150 - 440 K/uL 162 145(L) 150    Meds: fentanyl  Height:  Ht Readings from Last 1 Encounters:  02/01/15 5' (1.524 m)    Weight:  Wt Readings from Last 1 Encounters:  02/08/15 119 lb 11.4 oz (54.3 kg)    Filed Weights   02/06/15 0446 02/07/15 0406 02/08/15 0245  Weight: 130 lb 4.7 oz (59.1 kg) 125 lb 10.6 oz (57 kg) 119 lb 11.4 oz (54.3 kg)     BMI:  Body mass index is 23.38 kg/(m^2).  Estimated Nutritional Needs:  Kcal:  1150 kcals (Ve: 9, Tmax: 36.9) using current wt of 54 kg  Protein:  81-108 g (1.5-2.0 g/kg)  using current wt of 54 kg  Fluid:  1350-1620 mL (25-30 ml/kg) using current wt of 54 kg     Intake/Output Summary (Last 24 hours) at 02/08/15 1326 Last data filed at 02/08/15 1200  Gross per 24 hour  Intake 1546.5 ml  Output    700 ml  Net  846.5 ml   HIGH Care Level  Kerman Passey MS, RD, LDN [phone] Pager

## 2015-02-08 NOTE — Progress Notes (Signed)
[facility]  Daily Progress Note   Subjective  - 6,7 Days Post-Op  Remains intubated and on vent secondary to pneumonia  Objective Filed Vitals:   02/08/15 1200 02/08/15 1222 02/08/15 1300 02/08/15 1400  BP: 95/47  96/43 115/53  Pulse: 95  94 95  Temp:      TempSrc:      Resp: 18  18 18   Height:      Weight:      SpO2: 98% 98% 97% 98%    Intake/Output Summary (Last 24 hours) at 02/08/15 1509 Last data filed at 02/08/15 1400  Gross per 24 hour  Intake 1621.5 ml  Output    700 ml  Net  921.5 ml    PULM  ronchi bilateral  CV  No JVD, RRR Abd      Non distended, nontender VASC  Feet with Doppler signals bilaterally good pulse in the graft at the knee level             Moderate lymphatic drainage from the left groin  Laboratory CBC    Component Value Date/Time   WBC 6.0 02/08/2015 1229   WBC 5.2 09/14/2014 1426   WBC CANCELED 03/18/2013 1603   HGB 8.1* 02/08/2015 1229   HGB 13.4 09/14/2014 1426   HCT 24.6* 02/08/2015 1229   HCT 45.7 12/08/2014 1310   PLT 162 02/08/2015 1229   PLT 240 09/14/2014 1426    BMET    Component Value Date/Time   NA 143 02/08/2015 0459   NA 133* 10/20/2014 1603   NA 131* 04/22/2013 1546   K 4.0 02/08/2015 0459   K 4.0 04/22/2013 1546   CL 110 02/08/2015 0459   CL 100 04/22/2013 1546   CO2 22 02/08/2015 0459   CO2 23 04/22/2013 1546   GLUCOSE 134* 02/08/2015 0459   GLUCOSE 99 10/20/2014 1603   GLUCOSE 82 04/22/2013 1546   BUN 28* 02/08/2015 0459   BUN 13 10/20/2014 1603   BUN 21* 04/22/2013 1546   CREATININE 1.47* 02/08/2015 0459   CREATININE 1.34* 09/14/2014 1426   CALCIUM 8.3* 02/08/2015 0459   CALCIUM 8.0* 09/14/2014 1426   GFRNONAA 34* 02/08/2015 0459   GFRNONAA 44* 01/12/2014 1420   GFRAA 39* 02/08/2015 0459   GFRAA 51* 01/12/2014 1420    Assessment/Planning: POD #5,6 s/p femoral endarterectomies bilateral with bilateral iliac stenting and left fem BK pop bypass   Continue to treat the  DT's, now intubated   Will place a VAC on the left groin   Not really able to participate in PT  Continue ICU as she is critically ill    Katha Cabal  02/08/2015, 3:09 PM

## 2015-02-08 NOTE — Progress Notes (Signed)
Dr. Mortimer Fries called with ABG results.  Orders received to stop bicarb drip.  Nurse will continue to monitor.

## 2015-02-08 NOTE — Clinical Social Work Note (Signed)
Patient newly intubated last evening. Prognosis uncertain at this time.  Shela Leff MSW,LCSWA [phone]

## 2015-02-08 NOTE — Progress Notes (Signed)
Dr. Carroll Kinds placed wound vac to bilateral groin.

## 2015-02-08 NOTE — Care Management Note (Signed)
Case Management Note  Patient Details  Name: Jennifer Simpson MRN: [id number] Date of Birth: 07-Jul-1941  Subjective/Objective:   Intubated over night due to aspiration PNA, shortness of breath. Following.                 Action/Plan:   Expected Discharge Date:                  Expected Discharge Plan:     In-House Referral:     Discharge planning Services  CM Consult  Post Acute Care Choice:    Choice offered to:     DME Arranged:    DME Agency:     HH Arranged:    HH Agency:     Status of Service:  In process, will continue to follow  Medicare Important Message Given:  Yes Date Medicare IM Given:  02/02/15 Medicare IM give by:  Orvan July Date Additional Medicare IM Given:  02/06/15 Additional Medicare Important Message give by:  Orvan July  If discussed at Long Length of Stay Meetings, dates discussed:    Additional Comments:  Jolly Mango, RN 02/08/2015, 2:02 PM
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
## 2015-02-09 ENCOUNTER — Encounter: Payer: Self-pay | Admitting: Vascular Surgery

## 2015-02-09 LAB — BASIC METABOLIC PANEL
Anion gap: 8 (ref 5–15)
BUN: 34 mg/dL — AB (ref 6–20)
CO2: 25 mmol/L (ref 22–32)
CREATININE: 1.31 mg/dL — AB (ref 0.44–1.00)
Calcium: 7.9 mg/dL — ABNORMAL LOW (ref 8.9–10.3)
Chloride: 109 mmol/L (ref 101–111)
GFR, EST AFRICAN AMERICAN: 45 mL/min — AB (ref >60)
GFR, EST NON AFRICAN AMERICAN: 39 mL/min — AB (ref >60)
GLUCOSE: 198 mg/dL — AB (ref 65–99)
Potassium: 3.5 mmol/L (ref 3.5–5.1)
Sodium: 142 mmol/L (ref 135–145)

## 2015-02-09 LAB — GLUCOSE, CAPILLARY
GLUCOSE-CAPILLARY: 143 mg/dL — AB (ref 65–99)
Glucose-Capillary: 182 mg/dL — ABNORMAL HIGH (ref 65–99)
Glucose-Capillary: 163 mg/dL — ABNORMAL HIGH (ref 65–99)

## 2015-02-09 LAB — CBC
HCT: 24.6 % — ABNORMAL LOW (ref 35.0–47.0)
HEMOGLOBIN: 8.3 g/dL — AB (ref 12.0–16.0)
MCH: 34 pg (ref 26.0–34.0)
MCHC: 33.6 g/dL (ref 32.0–36.0)
MCV: 101.3 fL — AB (ref 80.0–100.0)
Platelets: 167 10*3/uL (ref 150–440)
RBC: 2.43 MIL/uL — ABNORMAL LOW (ref 3.80–5.20)
RDW: 20.8 % — ABNORMAL HIGH (ref 11.5–14.5)
WBC: 7.2 10*3/uL (ref 3.6–11.0)

## 2015-02-09 LAB — APTT: aPTT: 44 seconds — ABNORMAL HIGH (ref 24–36)

## 2015-02-09 LAB — MAGNESIUM: MAGNESIUM: 2 mg/dL (ref 1.7–2.4)

## 2015-02-09 LAB — PHOSPHORUS: Phosphorus: 2.9 mg/dL (ref 2.5–4.6)

## 2015-02-09 MED ORDER — MORPHINE SULFATE 4 MG/ML IJ SOLN
4.0000 mg | INTRAMUSCULAR | Status: DC | PRN
  Administered 2015-02-09 – 2015-02-10 (×7): 4 mg via INTRAVENOUS
  Filled 2015-02-09 (×7): qty 1

## 2015-02-09 MED ORDER — IPRATROPIUM-ALBUTEROL 0.5-2.5 (3) MG/3ML IN SOLN: 3.0000 mL | RESPIRATORY_TRACT | Status: DC | PRN

## 2015-02-09 MED ORDER — MORPHINE SULFATE 4 MG/ML IJ SOLN
4.0000 mg | INTRAMUSCULAR | Status: DC | PRN
  Administered 2015-02-09: 4 mg via INTRAVENOUS
  Filled 2015-02-09: qty 1

## 2015-02-09 MED ORDER — CLONAZEPAM 0.125 MG PO TBDP
0.2500 mg | ORAL_TABLET | Freq: Every day | ORAL | Status: DC
  Administered 2015-02-09: 0.25 mg via ORAL
  Filled 2015-02-09: qty 2

## 2015-02-09 MED ORDER — GLYCOPYRROLATE 0.2 MG/ML IJ SOLN
0.2000 mg | INTRAMUSCULAR | Status: DC | PRN
  Administered 2015-02-09 – 2015-02-10 (×4): 0.2 mg via INTRAVENOUS
  Filled 2015-02-09 (×4): qty 1

## 2015-02-09 MED ORDER — SODIUM CHLORIDE 0.9 % IV SOLN
250.0000 mg | Freq: Three times a day (TID) | INTRAVENOUS | Status: DC
  Administered 2015-02-09: 250 mg via INTRAVENOUS
  Filled 2015-02-09 (×4): qty 5

## 2015-02-09 NOTE — Progress Notes (Signed)
Pt remains on vent. Vent settings as charted. Sedated with Fentanyl. VSS. No changes durning night. Resting comfortably in bed  Without any distress.

## 2015-02-09 NOTE — Progress Notes (Signed)
Patient extubated by respiratory therapist with Remo Lipps present.  Remo Lipps placing comfort care orders as per family wishes.

## 2015-02-09 NOTE — Progress Notes (Signed)
   02/09/15 1700  Clinical Encounter Type  Visited With Family  Visit Type Initial  Referral From Chaplain  Consult/Referral To Chaplain  Spiritual Encounters  Spiritual Needs Emotional;Grief support  Stress Factors  Family Stress Factors Family relationships;Major life changes  Chaplin offered emotional and spiritual support to patient's family.  [employer] - pager [phone]

## 2015-02-09 NOTE — Care Management Note (Signed)
Case Management Note  Patient Details  Name: Jennifer Simpson MRN: [id number] Date of Birth: 01/10/1941  Subjective/Objective:  Pt being extubated. All IV antibiotics and pressors withdrawn.  It is expected that she will decline rapidly. Chaplain with family. Plan will be 1 c and the hospice home should pt not decline as expected.                  Action/Plan:   Expected Discharge Date:                  Expected Discharge Plan:     In-House Referral:     Discharge planning Services  CM Consult  Post Acute Care Choice:    Choice offered to:     DME Arranged:    DME Agency:     HH Arranged:    HH Agency:     Status of Service:  In process, will continue to follow  Medicare Important Message Given:  Yes Date Medicare IM Given:  02/02/15 Medicare IM give by:  Orvan July Date Additional Medicare IM Given:  02/06/15 Additional Medicare Important Message give by:  Orvan July  If discussed at Long Length of Stay Meetings, dates discussed:    Additional Comments:  Jolly Mango, RN 02/09/2015, 2:25 PM

## 2015-02-09 NOTE — Progress Notes (Signed)
Patient transferred from CCU to 1C for comfort measures only. Patient appears comfortable at this time. Foley in place. Wound vac in place. [name]. Patient repositioned. Oral care provided. Madlyn Frankel, RN

## 2015-02-09 NOTE — Progress Notes (Signed)
Jennifer Simpson with palliative reviewing chart and legal paper work.

## 2015-02-09 NOTE — Progress Notes (Signed)
Family requested prayer prior to medical procedure. Prayer was said with patient. Jennifer Simpson [phone]

## 2015-02-09 NOTE — Progress Notes (Signed)
Patient extubated to 2LPM [place] per Annie Main with palliative care.  Pt suctioned prior to extubation.  Tolerated extubation well.

## 2015-02-09 NOTE — Consult Note (Signed)
Pt extubated to comfort care per family.  Pt off ventilator now.  4mg  morphine received.  Pt currently comfortable and family at bedside.  Comfort care orders entered.  Pt to transfer to 1C when bed is available.  Pt with long standing history of alcohol abuse.  Suggest increasing PRN morphine if pt becomes uncomfortable while palliative medicine is not here and if pt is requiring frequent dosing please start morphine drip.  Time spent: 45 minutes

## 2015-02-09 NOTE — Progress Notes (Signed)
   02/09/15 1448  Clinical Encounter Type  Visited With Patient and family together  Visit Type Follow-up  Referral From Chaplain  Consult/Referral To Chaplain  Spiritual Encounters  Spiritual Needs Emotional  Stress Factors  Family Stress Factors Health changes  Chaplain went in to support patient and family as the patient was being extubated.Will refer patient and family to next unit chaplain for comfort care. Chaplain Jazmin Vensel A. Megean Fabio Ext. [phone]

## 2015-02-09 NOTE — Progress Notes (Signed)
Remo Lipps in conference room talking with family.

## 2015-02-09 NOTE — Progress Notes (Signed)
Patient brother at bedside and  verbalized that he wanted to talk with MD about comfort care measures.  Patient has sister at bedside as well wanting to "talk to MD about plan and when she is going to get better".  Patients brother verbalized that he is POA and has the paper work.  Patient brother stated "she said she did not want any of this".  Patient brother left hospital to bring in [place] paper work and verbalized he would be back in 30-45 minutes.  Dr. Lucky Cowboy called and notified of issue at hand.  Order received for palliative care consult.

## 2015-02-09 NOTE — Consult Note (Signed)
Palliative Medicine Inpatient Consult Note   Name: Jennifer Simpson Date: 02/09/2015 MRN: [id number]  DOB: 05/30/1941  Referring Physician: Katha Cabal, MD  Palliative Care consult requested for this 74 y.o. female for goals of medical therapy in patient with COPD, severe PVD, h/o squamous cell carcinoma of oropharynx, and alcohol abuse. Pt admitted for L femoral endarectomy.  Ms. Tkach is a 74 year old female with a PMH of COPD, asthma, extensive PVD, s/p peripheral artery stent, h/o iliac stents, h/o stomach ulcer, current alcohol abuse, CKD unknown stage, and GERD.  Pt presented to D. Schnier's office on [phone] with LLE pain.  Pt was emergently admitted for L femoral endarectomy.  Post surgery pt developed DT's due to extensive alcohol abuse.  Pt then developed PNA and was intubated for respiratory failure.  Family at bedside.  Pt resting in bed on SBT mode and tolerating well.   REVIEW OF SYSTEMS:  Patient is not able to provide ROS  SPIRITUAL SUPPORT SYSTEM: Yes.  SOCIAL HISTORY:PT lives at home alone.  Family states pt drinks over a bottle of wine a day.  Pt is widowed and has no children.  Pt has two living siblings and brother, Laveda Abbe, is HCPOA.  reports that she has been smoking Cigarettes.  She has a 12.5 pack-year smoking history. She has never used smokeless tobacco. She reports that she drinks about 3.0 oz of alcohol per week. She reports that she does not use illicit drugs.  LEGAL DOCUMENTS:  Advance Directives:  Yes. Health Care Power of Attorney:  Yes.  CODE STATUS: DNR  PAST MEDICAL HISTORY: Past Medical History  Diagnosis Date  . Asthma   . Peripheral vascular disease     a. h/o iliac stents 2010. b. RAS as below.  . History of skin ulcer   . Hypertension   . GERD (gastroesophageal reflux disease)   . History of stomach ulcers   . Hyponatremia     Previously related to HCTZ/chlorthalidone  . Clotting disorder     left leg  . Chronic kidney disease   .  Erythrocytosis 01/15/2015  . Lower leg DVT (deep venous thrombosis) 01/15/2015  . Collagen vascular disease   . Squamous cell carcinoma of oropharynx 2008     status post chemotherapy and radiation therapy in 2008  . Cancer of esophagus 2008    s/p chemo and radiation    PAST SURGICAL HISTORY:  Past Surgical History  Procedure Laterality Date  . Peripheral arterial stent graft Bilateral     x2 STENTS BOTH LEGS  . Anterior cervical decomp/discectomy fusion  ~ 2006    "put in spacer" (12/02/2012)  . Portacath placement  2008  . Anterior cervical decomp/discectomy fusion  10/07/2012    Procedure: ANTERIOR CERVICAL DECOMPRESSION/DISCECTOMY FUSION 2 LEVEL/HARDWARE REMOVAL;  Surgeon: Ophelia Charter, MD;  Location: [place] [facility];  Service: Neurosurgery;  Laterality: N/A;  Cervical three-four, cervical four-five Anterior cervical decompression/diskectomy/fusion/interbody plating and prosthesis/removal of spinal concept plate  . Cataract extraction w/ intraocular lens  implant, bilateral  ~2005  . Renal artery stent Right 12/02/2012    Successful angioplasty and bare-metal stent placement to the ostial right renal artery  . Tonsillectomy  ~ 1946  . Breast cyst excision Right ?1980's    negative  . Renal angiogram N/A 12/02/2012    Procedure: RENAL ANGIOGRAM;  Surgeon: Wellington Hampshire, MD;  Location: [facility] C[facility];  Service: Cardiovascular;  Laterality: N/A;  . Endarterectomy femoral Right 02/02/2015  Procedure: ENDARTERECTOMY FEMORAL;  Surgeon: Katha Cabal, MD;  Location: [facility] ORS;  Service: Vascular;  Laterality: Right;  . Insertion of iliac stent Bilateral 02/02/2015    Procedure: INSERTION OF ILIAC STENT;  Surgeon: Katha Cabal, MD;  Location: [facility];  Service: Vascular;  Laterality: Bilateral;  . Femoral-popliteal bypass graft Left 02/02/2015    Procedure: BYPASS GRAFT FEMORAL-POPLITEAL ARTERY;  Surgeon: Katha Cabal, MD;  Location: [facility];  Service: Vascular;  Laterality: Left;  .  Endarterectomy femoral N/A 02/01/2015    Procedure: ENDARTERECTOMY FEMORAL;  Surgeon: Katha Cabal, MD;  Location: [facility];  Service: Vascular;  Laterality: N/A;  . Insertion of iliac stent Left 02/01/2015    Procedure: INSERTION OF ILIAC STENT;  Surgeon: Katha Cabal, MD;  Location: [facility] ORS;  Service: Vascular;  Laterality: Left;  . Peripheral vascular catheterization Right 01/31/2015    Procedure: Lower Extremity Angiography;  Surgeon: Katha Cabal, MD;  Location: [place] [facility];  Service: Cardiovascular;  Laterality: Right;  . Peripheral vascular catheterization  01/31/2015    Procedure: Lower Extremity Intervention;  Surgeon: Katha Cabal, MD;  Location: [place] [facility];  Service: Cardiovascular;;    ALLERGIES:  is allergic to trimethobenzamide; amoxicillin-pot clavulanate; ceftin; chlorthalidone; hctz; polymyxin b-trimethoprim; and tequin.  MEDICATIONS:  Current Facility-Administered Medications  Medication Dose Route Frequency Provider Last Rate Last Dose  . acetaminophen (TYLENOL) tablet 325-650 mg  325-650 mg Oral Q4H PRN Katha Cabal, MD       Or  . acetaminophen (TYLENOL) suppository 325-650 mg  325-650 mg Rectal Q4H PRN Katha Cabal, MD      . acidophilus (RISAQUAD) capsule 2 capsule  2 capsule Oral Daily Flora Lipps, MD   2 capsule at 02/09/15 0808  . antiseptic oral rinse (CPC / CETYLPYRIDINIUM CHLORIDE 0.05%) solution 7 mL  7 mL Mouth Rinse QID Flora Lipps, MD   7 mL at 02/09/15 1200  . budesonide (PULMICORT) nebulizer solution 0.5 mg  0.5 mg Nebulization BID Flora Lipps, MD   0.5 mg at 02/09/15 0810  . chlorhexidine (PERIDEX) 0.12 % solution 15 mL  15 mL Mouth Rinse BID Flora Lipps, MD   15 mL at 02/09/15 0809  . clindamycin (CLEOCIN) IVPB 600 mg  600 mg Intravenous 3 times per day Flora Lipps, MD   600 mg at 02/08/15 2143  . clonazepam (KLONOPIN) disintegrating tablet 0.125 mg  0.125 mg Oral Daily Flora Lipps, MD   0.125 mg at 02/09/15 0808   . clonazepam (KLONOPIN) disintegrating tablet 0.25 mg  0.25 mg Oral QHS Flora Lipps, MD   0.25 mg at 02/08/15 2143  . docusate (COLACE) 50 MG/5ML liquid 50 mg  50 mg Oral BID Flora Lipps, MD   50 mg at 02/09/15 0809  . erythromycin 250 mg in sodium chloride 0.9 % 100 mL IVPB  250 mg Intravenous 3 times per day Charlett Nose, RPH   250 mg at 02/09/15 1247  . famotidine (PEPCID) tablet 20 mg  20 mg Oral Daily Katha Cabal, MD   20 mg at 02/09/15 0809  . feeding supplement (VITAL HIGH PROTEIN) liquid 1,000 mL  1,000 mL Per Tube Q24H Flora Lipps, MD   1,000 mL at 02/08/15 1558  . fentaNYL 256mcg in NS 243mL (43mcg/ml) infusion-PREMIX  50 mcg/hr Intravenous Continuous Flora Lipps, MD 7.5 mL/hr at 02/09/15 0600 75 mcg/hr at 02/09/15 0600  . free water 25 mL  25 mL Per Tube Q4H Flora Lipps,  MD   25 mL at 02/09/15 1230  . heparin ADULT infusion 100 units/mL (25000 units/250 mL)  400 Units/hr Intravenous Continuous Katha Cabal, MD 4 mL/hr at 02/09/15 0600 400 Units/hr at 02/09/15 0600  . hydrALAZINE (APRESOLINE) injection 5 mg  5 mg Intravenous Q20 Min PRN Katha Cabal, MD      . insulin aspart (novoLOG) injection 1-3 Units  1-3 Units Subcutaneous 6 times per day Flora Lipps, MD   2 Units at 02/09/15 1247  . ipratropium-albuterol (DUONEB) 0.5-2.5 (3) MG/3ML nebulizer solution 3 mL  3 mL Inhalation Q4H Flora Lipps, MD   3 mL at 02/09/15 1216  . labetalol (NORMODYNE,TRANDATE) injection 10 mg  10 mg Intravenous Q10 min PRN Katha Cabal, MD   10 mg at 02/03/15 8250  . LORazepam (ATIVAN) injection 1-2 mg  1-2 mg Intravenous Q2H PRN Algernon Huxley, MD   2 mg at 02/09/15 0808  . methylPREDNISolone sodium succinate (SOLU-MEDROL) 40 mg/mL injection 40 mg  40 mg Intravenous Q12H Flora Lipps, MD   40 mg at 02/09/15 0520  . multivitamin liquid 5 mL  5 mL Oral Daily Flora Lipps, MD   5 mL at 02/09/15 0808  . mupirocin ointment (BACTROBAN) 2 %   Topical BID Katha Cabal, MD        Vital  Signs: BP 116/49 mmHg  Pulse 86  Temp(Src) 98.5 F (36.9 C) (Oral)  Resp 18  Ht 5' (1.524 m)  Wt 57.4 kg (126 lb 8.7 oz)  BMI 24.71 kg/m2  SpO2 97% Filed Weights   02/07/15 0406 02/08/15 0245 02/09/15 0529  Weight: 57 kg (125 lb 10.6 oz) 54.3 kg (119 lb 11.4 oz) 57.4 kg (126 lb 8.7 oz)    Estimated body mass index is 24.71 kg/(m^2) as calculated from the following:   Height as of this encounter: 5' (1.524 m).   Weight as of this encounter: 57.4 kg (126 lb 8.7 oz).  PERFORMANCE STATUS (ECOG) : 2 - Symptomatic, <50% confined to bed  PHYSICAL EXAM: [place]: Critically ill appearing HEENT: OP clear, ETT present Neck: Trachea midline  Cardiovascular: regular rate and rhythm Pulmonary/Chest: Poor air movemnt ant fields, no audible wheeze Abdominal: Soft. Hypoactive bowel sounds GU: Foley present, clear yellow urine Extremities:LLE dressing CDI Neurological: Opens eyes to voice, moves extremities Skin: Warm, dry and intact.  Psychiatric: Unable to assess   LABS: CBC:    Component Value Date/Time   WBC 7.2 02/09/2015 0451   WBC 5.2 09/14/2014 1426   WBC CANCELED 03/18/2013 1603   HGB 8.3* 02/09/2015 0451   HGB 13.4 09/14/2014 1426   HCT 24.6* 02/09/2015 0451   HCT 45.7 12/08/2014 1310   PLT 167 02/09/2015 0451   PLT 240 09/14/2014 1426   MCV 101.3* 02/09/2015 0451   MCV 92 09/14/2014 1426   NEUTROABS 7.8* 02/02/2015 2258   NEUTROABS 3.7 09/14/2014 1426   NEUTROABS 6.4 01/28/2013 1639   LYMPHSABS 0.2* 02/02/2015 2258   LYMPHSABS 0.8* 09/14/2014 1426   LYMPHSABS CANCELED 03/18/2013 1603   MONOABS 0.8 02/02/2015 2258   MONOABS 0.6 09/14/2014 1426   EOSABS 0.0 02/02/2015 2258   EOSABS 0.0 09/14/2014 1426   EOSABS CANCELED 03/18/2013 1603   BASOSABS 0.0 02/02/2015 2258   BASOSABS 0.1 09/14/2014 1426   BASOSABS CANCELED 03/18/2013 1603   Comprehensive Metabolic Panel:    Component Value Date/Time   NA 142 02/09/2015 0451   NA 133* 10/20/2014 1603   NA 131*  04/22/2013 1546  K 3.5 02/09/2015 0451   K 4.0 04/22/2013 1546   CL 109 02/09/2015 0451   CL 100 04/22/2013 1546   CO2 25 02/09/2015 0451   CO2 23 04/22/2013 1546   BUN 34* 02/09/2015 0451   BUN 13 10/20/2014 1603   BUN 21* 04/22/2013 1546   CREATININE 1.31* 02/09/2015 0451   CREATININE 1.34* 09/14/2014 1426   GLUCOSE 198* 02/09/2015 0451   GLUCOSE 99 10/20/2014 1603   GLUCOSE 82 04/22/2013 1546   CALCIUM 7.9* 02/09/2015 0451   CALCIUM 8.0* 09/14/2014 1426   AST 25 02/08/2015 0459   AST 16 09/14/2014 1426   ALT 35 02/08/2015 0459   ALT 15 09/14/2014 1426   ALKPHOS 51 02/08/2015 0459   ALKPHOS 71 09/14/2014 1426   BILITOT 0.8 02/08/2015 0459   PROT 4.3* 02/08/2015 0459   PROT 6.2* 09/14/2014 1426   ALBUMIN 2.0* 02/08/2015 0459   ALBUMIN 3.4 09/14/2014 1426    IMPRESSION:  Ms. Brett is a 74 year old female with a PMH of COPD, asthma, extensive PVD, s/p peripheral artery stent, h/o iliac stents, h/o stomach ulcer, current alcohol abuse, CKD unknown stage, and GERD.  Pt presented to D. Schnier's office on 02-01-2015 with LLE pain.  Pt was emergently admitted for L femoral endarectomy.  Post surgery pt developed DT's due to extensive alcohol abuse.  Pt then developed PNA and was intubated for respiratory failure.  Family at bedside.  Pt currently resting in bed on SBT mode.  Fio2 35%, 5 PEEP and 5 PS.  Family agrees to goals of care meeting.  Family states pt would not want to be intubated and wants to have pt off ventilator.  Family states pt health has been declining and is mainly chair bound due to PVD.  Family wants to wean pt off ventilator and extubate by tomorrow at the [facility].  Pt doing well on SBT now and plan is to extubate around 230.    PLAN: 1. extubation at 230 today when family is present     More than 50% of the visit was spent in counseling/coordination of care: Yes  Time Spent: 64minutes

## 2015-02-09 NOTE — Care Management Note (Signed)
Case Management Note  Patient Details  Name: Jennifer Simpson MRN: [id number] Date of Birth: Oct 02, 1940  Subjective/Objective:   Plan is to extubate today and not re intubate.                  Action/Plan:   Expected Discharge Date:                  Expected Discharge Plan:     In-House Referral:     Discharge planning Services  CM Consult  Post Acute Care Choice:    Choice offered to:     DME Arranged:    DME Agency:     HH Arranged:    HH Agency:     Status of Service:  In process, will continue to follow  Medicare Important Message Given:  Yes Date Medicare IM Given:  02/02/15 Medicare IM give by:  Orvan July Date Additional Medicare IM Given:  02/06/15 Additional Medicare Important Message give by:  Orvan July  If discussed at Long Length of Stay Meetings, dates discussed:    Additional Comments:  Jolly Mango, RN 02/09/2015, 1:09 PM

## 2015-02-09 NOTE — Clinical Social Work Note (Signed)
Palliative Care note reviewed and CSW has spoken to RN CM who states plan is for extubation with no reintubation. Patient is not expected to survive this admission. According to RN CM, patient may be transferred to 1c then to hospice home if appropriate. Shela Leff MSW,LCSWA [phone]

## 2015-02-09 NOTE — Progress Notes (Signed)
Nutrition Follow-up  INTERVENTION:   (EN): continue TF as tolerated  NUTRITION DIAGNOSIS:  Inadequate oral intake related to acute illness as evidenced by meal completion < 25%. Being addressed via TF   GOAL:  Patient will meet greater than or equal to 90% of their needs  EN: tolerance  MONITOR:   (EN, Energy Intake, Anthropometrics, Digestive System, Electrolyte/Renal Profile, Glucose Profile)   ASSESSMENT:  Pt remains on vent, noted wound vac placed to groin  EN: tolerating Vital High Protein at rate of 45 ml/hr  Digestive System: last BM 6/7 small stool  Meds: MVI, erythromycin, fentanyl  Height:  Ht Readings from Last 1 Encounters:  02/01/15 5' (1.524 m)    Weight:  Wt Readings from Last 1 Encounters:  02/09/15 126 lb 8.7 oz (57.4 kg)     Filed Weights   02/07/15 0406 02/08/15 0245 02/09/15 0529  Weight: 125 lb 10.6 oz (57 kg) 119 lb 11.4 oz (54.3 kg) 126 lb 8.7 oz (57.4 kg)     BMI:  Body mass index is 24.71 kg/(m^2).  Estimated Nutritional Needs:  Kcal:  1150 kcals (Ve: 9, Tmax: 36.9) using current wt of 54 kg  Protein:  81-108 g (1.5-2.0 g/kg) using current wt of 54 kg  Fluid:  1350-1620 mL (25-30 ml/kg) using current wt of 54 kg     HIGH Care Level  Kerman Passey MS, RD, LDN [phone] Pager

## 2015-02-09 NOTE — Progress Notes (Signed)
[facility]  Daily Progress Note   Subjective  - 7 Days Post-Op  Patient remains intubated and sedated. Getting tube feeds. VAC with good seal over groin incisions, weeping from leg wounds has decreased somewhat. Sister very concerned about her overall prognosis.  Objective Filed Vitals:   02/09/15 0740 02/09/15 0800 02/09/15 0900 02/09/15 1000  BP:  113/54 120/51 109/50  Pulse:  92 95 90  Temp: 98.5 F (36.9 C)     TempSrc: Oral     Resp:  18 18 18   Height:      Weight:      SpO2:  100% 98% 97%    Intake/Output Summary (Last 24 hours) at 02/09/15 1028 Last data filed at 02/09/15 1000  Gross per 24 hour  Intake 1867.35 ml  Output    775 ml  Net 1092.35 ml    PULM  CTAB CV  RRR VASC  feet warm, good Doppler signals in the feet. Incisions as above  Laboratory CBC    Component Value Date/Time   WBC 7.2 02/09/2015 0451   WBC 5.2 09/14/2014 1426   WBC CANCELED 03/18/2013 1603   HGB 8.3* 02/09/2015 0451   HGB 13.4 09/14/2014 1426   HCT 24.6* 02/09/2015 0451   HCT 45.7 12/08/2014 1310   PLT 167 02/09/2015 0451   PLT 240 09/14/2014 1426    BMET    Component Value Date/Time   NA 142 02/09/2015 0451   NA 133* 10/20/2014 1603   NA 131* 04/22/2013 1546   K 3.5 02/09/2015 0451   K 4.0 04/22/2013 1546   CL 109 02/09/2015 0451   CL 100 04/22/2013 1546   CO2 25 02/09/2015 0451   CO2 23 04/22/2013 1546   GLUCOSE 198* 02/09/2015 0451   GLUCOSE 99 10/20/2014 1603   GLUCOSE 82 04/22/2013 1546   BUN 34* 02/09/2015 0451   BUN 13 10/20/2014 1603   BUN 21* 04/22/2013 1546   CREATININE 1.31* 02/09/2015 0451   CREATININE 1.34* 09/14/2014 1426   CALCIUM 7.9* 02/09/2015 0451   CALCIUM 8.0* 09/14/2014 1426   GFRNONAA 39* 02/09/2015 0451   GFRNONAA 44* 01/12/2014 1420   GFRAA 45* 02/09/2015 0451   GFRAA 51* 01/12/2014 1420    Assessment/Planning: POD #7 s/p bilateral lower extremity revascularization which was extensive   Alcohol withdrawal  has created a major issue and she is now intubated and sedated on the ventilator.   Preoperative nutritional status was extremely poor. Now getting tube feeds.  Vascular status appears to be intact currently, but multiple other medical issues and alcohol withdrawal making things difficult at this point.      Jennifer Simpson  02/09/2015, 10:28 AM

## 2015-02-09 NOTE — Progress Notes (Signed)
RT placed patient in SBT 10/5 per Annie Main with palliative care.  Pt. Tolerating well at this time.  Vt 400's and O2 sat 95%.  Will continue to monitor.
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
## 2015-02-10 ENCOUNTER — Encounter: Payer: Self-pay | Admitting: Vascular Surgery

## 2015-02-10 DIAGNOSIS — J449 Chronic obstructive pulmonary disease, unspecified: Secondary | ICD-10-CM

## 2015-02-10 DIAGNOSIS — R451 Restlessness and agitation: Secondary | ICD-10-CM

## 2015-02-10 DIAGNOSIS — Z9889 Other specified postprocedural states: Secondary | ICD-10-CM

## 2015-02-10 DIAGNOSIS — F1010 Alcohol abuse, uncomplicated: Secondary | ICD-10-CM

## 2015-02-10 DIAGNOSIS — N189 Chronic kidney disease, unspecified: Secondary | ICD-10-CM

## 2015-02-10 DIAGNOSIS — J189 Pneumonia, unspecified organism: Secondary | ICD-10-CM

## 2015-02-10 DIAGNOSIS — Z515 Encounter for palliative care: Secondary | ICD-10-CM

## 2015-02-10 DIAGNOSIS — Z86718 Personal history of other venous thrombosis and embolism: Secondary | ICD-10-CM

## 2015-02-10 DIAGNOSIS — Z85819 Personal history of malignant neoplasm of unspecified site of lip, oral cavity, and pharynx: Secondary | ICD-10-CM

## 2015-02-10 DIAGNOSIS — Z66 Do not resuscitate: Secondary | ICD-10-CM

## 2015-02-10 DIAGNOSIS — I129 Hypertensive chronic kidney disease with stage 1 through stage 4 chronic kidney disease, or unspecified chronic kidney disease: Secondary | ICD-10-CM

## 2015-02-10 MED ORDER — MORPHINE SULFATE 4 MG/ML IJ SOLN: 4.0000 mg | INTRAMUSCULAR | Status: AC | PRN

## 2015-02-10 MED ORDER — HALOPERIDOL LACTATE 5 MG/ML IJ SOLN: 1.0000 mg | Freq: Four times a day (QID) | INTRAMUSCULAR | Status: DC | PRN

## 2015-02-10 MED ORDER — GLYCOPYRROLATE 2 MG PO TABS: 2.0000 mg | ORAL_TABLET | Freq: Four times a day (QID) | ORAL | Status: AC | PRN

## 2015-02-10 MED ORDER — HALOPERIDOL LACTATE 5 MG/ML IJ SOLN: 1.0000 mg | Freq: Four times a day (QID) | INTRAMUSCULAR | Status: AC | PRN

## 2015-02-10 MED ORDER — LORAZEPAM 2 MG/ML IJ SOLN: 1.0000 mg | INTRAMUSCULAR | Status: AC | PRN

## 2015-02-10 NOTE — Discharge Summary (Signed)
[facility]    Discharge Summary    Patient ID:  Jennifer Simpson MRN: [id number] DOB/AGE: 03-30-1941 74 y.o.  Admit date: 02/01/2015 Discharge date: 02/10/2015 Date of Surgery: 02/01/2015 - 02/02/2015 Surgeon: Surgeon(s): Katha Cabal, MD Algernon Huxley, MD  Admission Diagnosis: Left Lower Extremity Arterial Occlusion LEFT LEG ISCHAEMIA E  Discharge Diagnoses:  Left Lower Extremity Arterial Occlusion LEFT LEG ISCHAEMIA E  Secondary Diagnoses: Past Medical History  Diagnosis Date  . Asthma   . Peripheral vascular disease     a. h/o iliac stents 2010. b. RAS as below.  . History of skin ulcer   . Hypertension   . GERD (gastroesophageal reflux disease)   . History of stomach ulcers   . Hyponatremia     Previously related to HCTZ/chlorthalidone  . Clotting disorder     left leg  . Chronic kidney disease   . Erythrocytosis 01/15/2015  . Lower leg DVT (deep venous thrombosis) 01/15/2015  . Collagen vascular disease   . Squamous cell carcinoma of oropharynx 2008     status post chemotherapy and radiation therapy in 2008  . Cancer of esophagus 2008    s/p chemo and radiation    Procedure(s): ENDARTERECTOMY FEMORAL INSERTION OF ILIAC STENT BYPASS GRAFT FEMORAL-POPLITEAL ARTERY  Discharged Condition: poor  HPI:  Patient admitted with extensive BLE arterial occlusive disease.    Hospital Course:  Jennifer Simpson is a 74 y.o. female is S/P BLE revascularizations.  Had two major procedures to restore flow to BLE.  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  Family desires comfort measures only at this point Procedure(s): ENDARTERECTOMY FEMORAL INSERTION OF ILIAC STENT BYPASS GRAFT FEMORAL-POPLITEAL ARTERY  Physical exam: feet warm, wounds draining serous fluid Nearly obtunded Labs as below   Consults:  Treatment Team:  Flora Lipps, MD  Significant Diagnostic Studies: CBC Lab Results  Component Value Date   WBC 7.2  02/09/2015   HGB 8.3* 02/09/2015   HCT 24.6* 02/09/2015   MCV 101.3* 02/09/2015   PLT 167 02/09/2015    BMET    Component Value Date/Time   NA 142 02/09/2015 0451   NA 133* 10/20/2014 1603   NA 131* 04/22/2013 1546   K 3.5 02/09/2015 0451   K 4.0 04/22/2013 1546   CL 109 02/09/2015 0451   CL 100 04/22/2013 1546   CO2 25 02/09/2015 0451   CO2 23 04/22/2013 1546   GLUCOSE 198* 02/09/2015 0451   GLUCOSE 99 10/20/2014 1603   GLUCOSE 82 04/22/2013 1546   BUN 34* 02/09/2015 0451   BUN 13 10/20/2014 1603   BUN 21* 04/22/2013 1546   CREATININE 1.31* 02/09/2015 0451   CREATININE 1.34* 09/14/2014 1426   CALCIUM 7.9* 02/09/2015 0451   CALCIUM 8.0* 09/14/2014 1426   GFRNONAA 39* 02/09/2015 0451   GFRNONAA 44* 01/12/2014 1420   GFRAA 45* 02/09/2015 0451   GFRAA 51* 01/12/2014 1420   COAG Lab Results  Component Value Date   INR 1.82 02/02/2015   INR 1.37 02/01/2015   INR 1.15 01/31/2015     Disposition:  Discharge to :hospice home     Medication List    STOP taking these medications        acetaminophen 500 MG tablet  Commonly known as:  TYLENOL     albuterol-ipratropium 18-103 MCG/ACT inhaler  Commonly known as:  COMBIVENT     calcium carbonate 500 MG chewable tablet  Commonly known as:  TUMS - dosed in mg  elemental calcium     cyclobenzaprine 5 MG tablet  Commonly known as:  FLEXERIL     diltiazem 240 MG 24 hr capsule  Commonly known as:  CARDIZEM CD     fluticasone 50 MCG/ACT nasal spray  Commonly known as:  FLONASE     hydrALAZINE 10 MG tablet  Commonly known as:  APRESOLINE     losartan 100 MG tablet  Commonly known as:  COZAAR     multivitamin-iron-minerals-folic acid chewable tablet     mupirocin ointment 2 %  Commonly known as:  BACTROBAN     pantoprazole 40 MG tablet  Commonly known as:  PROTONIX     promethazine 12.5 MG tablet  Commonly known as:  PHENERGAN     spironolactone 25 MG tablet  Commonly known as:  ALDACTONE     warfarin  2.5 MG tablet  Commonly known as:  COUMADIN     ZANTAC PO      TAKE these medications        glycopyrrolate 2 MG tablet  Commonly known as:  ROBINUL  Take 1 tablet (2 mg total) by mouth every 6 (six) hours as needed (secretions).     haloperidol lactate 5 MG/ML injection  Commonly known as:  HALDOL  Inject 0.2-0.4 mLs (1-2 mg total) into the vein every 6 (six) hours as needed (agitation).     LORazepam 2 MG/ML injection  Commonly known as:  ATIVAN  Inject 0.5-1 mLs (1-2 mg total) into the vein every 2 (two) hours as needed for anxiety.     morphine 4 MG/ML injection  Inject 1 mL (4 mg total) into the vein every 30 (thirty) minutes as needed for severe pain (SOB).       Verbal and written Discharge instructions given to the patient. Wound care per Discharge AVS   Signed: Leotis Pain, MD  02/10/2015, 3:22 PM

## 2015-02-10 NOTE — Consult Note (Signed)
Palliative Medicine Inpatient Consult Follow Up Note   Name: Jennifer Simpson Date: 02/10/2015 MRN: [id number]  DOB: 1941-02-17  Referring Physician: Katha Cabal, MD  Palliative Care consult requested for this 74 y.o. female for goals of medical therapy in patient with COPD, severe PVD, h/o squamous cell carcinoma of oropharynx, and alcohol abuse. Pt admitted for L femoral endarectomy.    Pt resting in bed comfortably.  Pt on comfort care    REVIEW OF SYSTEMS:  Patient is not able to provide ROS  CODE STATUS: DNR   PAST MEDICAL HISTORY: Past Medical History  Diagnosis Date  . Asthma   . Peripheral vascular disease     a. h/o iliac stents 2010. b. RAS as below.  . History of skin ulcer   . Hypertension   . GERD (gastroesophageal reflux disease)   . History of stomach ulcers   . Hyponatremia     Previously related to HCTZ/chlorthalidone  . Clotting disorder     left leg  . Chronic kidney disease   . Erythrocytosis 01/15/2015  . Lower leg DVT (deep venous thrombosis) 01/15/2015  . Collagen vascular disease   . Squamous cell carcinoma of oropharynx 2008     status post chemotherapy and radiation therapy in 2008  . Cancer of esophagus 2008    s/p chemo and radiation    PAST SURGICAL HISTORY:  Past Surgical History  Procedure Laterality Date  . Peripheral arterial stent graft Bilateral     x2 STENTS BOTH LEGS  . Anterior cervical decomp/discectomy fusion  ~ 2006    "put in spacer" (12/02/2012)  . Portacath placement  2008  . Anterior cervical decomp/discectomy fusion  10/07/2012    Procedure: ANTERIOR CERVICAL DECOMPRESSION/DISCECTOMY FUSION 2 LEVEL/HARDWARE REMOVAL;  Surgeon: Ophelia Charter, MD;  Location: [facility];  Service: Neurosurgery;  Laterality: N/A;  Cervical three-four, cervical four-five Anterior cervical decompression/diskectomy/fusion/interbody plating and prosthesis/removal of spinal concept plate  . Cataract extraction w/ intraocular lens  implant,  bilateral  ~2005  . Renal artery stent Right 12/02/2012    Successful angioplasty and bare-metal stent placement to the ostial right renal artery  . Tonsillectomy  ~ 1946  . Breast cyst excision Right ?1980's    negative  . Renal angiogram N/A 12/02/2012    Procedure: RENAL ANGIOGRAM;  Surgeon: Wellington Hampshire, MD;  Location: [facility] CATH LAB;  Service: Cardiovascular;  Laterality: N/A;  . Peripheral vascular catheterization Right 01/31/2015    Procedure: Lower Extremity Angiography;  Surgeon: Katha Cabal, MD;  Location: [facility] LAB;  Service: Cardiovascular;  Laterality: Right;  . Peripheral vascular catheterization  01/31/2015    Procedure: Lower Extremity Intervention;  Surgeon: Katha Cabal, MD;  Location: [facility];  Service: Cardiovascular;;  . Endarterectomy femoral Right 02/02/2015    Procedure: ENDARTERECTOMY FEMORAL;  Surgeon: Katha Cabal, MD;  Location: [facility] ORS;  Service: Vascular;  Laterality: Right;  . Insertion of iliac stent Bilateral 02/02/2015    Procedure: INSERTION OF ILIAC STENT;  Surgeon: Katha Cabal, MD;  Location: [facility] ORS;  Service: Vascular;  Laterality: Bilateral;  . Femoral-popliteal bypass graft Left 02/02/2015    Procedure: BYPASS GRAFT FEMORAL-POPLITEAL ARTERY;  Surgeon: Katha Cabal, MD;  Location: [facility] ORS;  Service: Vascular;  Laterality: Left;  . Endarterectomy femoral N/A 02/01/2015    Procedure: ENDARTERECTOMY FEMORAL;  Surgeon: Katha Cabal, MD;  Location: [facility] ORS;  Service: Vascular;  Laterality: N/A;  . Insertion of iliac stent  Left 02/01/2015    Procedure: INSERTION OF ILIAC STENT;  Surgeon: Katha Cabal, MD;  Location: [facility] ORS;  Service: Vascular;  Laterality: Left;    Vital Signs: BP 134/58 mmHg  Pulse 98  Temp(Src) 98.6 F (37 C) (Oral)  Resp 8  Ht 5' (1.524 m)  Wt 57.4 kg (126 lb 8.7 oz)  BMI 24.71 kg/m2  SpO2 90% Filed Weights   02/07/15 0406 02/08/15 0245 02/09/15 0529  Weight: 57 kg (125 lb  10.6 oz) 54.3 kg (119 lb 11.4 oz) 57.4 kg (126 lb 8.7 oz)    Estimated body mass index is 24.71 kg/(m^2) as calculated from the following:   Height as of this encounter: 5' (1.524 m).   Weight as of this encounter: 57.4 kg (126 lb 8.7 oz).  PHYSICAL EXAM: [place]: NAD HEENT: OP clear, [place] 2L Neck: Trachea midline  Cardiovascular: regular rate and rhythm Pulmonary/Chest: Poor air movemnt ant fields, no audible wheeze Abdominal: Soft. Hypoactive bowel sounds GU: Foley present, clear yellow urine Extremities:LLE dressing CDI Neurological: Opens eyes to voice, moves extremities Skin: Warm, dry and intact.  Psychiatric: Unable to assess  LABS: CBC:    Component Value Date/Time   WBC 7.2 02/09/2015 0451   WBC 5.2 09/14/2014 1426   WBC CANCELED 03/18/2013 1603   HGB 8.3* 02/09/2015 0451   HGB 13.4 09/14/2014 1426   HCT 24.6* 02/09/2015 0451   HCT 45.7 12/08/2014 1310   PLT 167 02/09/2015 0451   PLT 240 09/14/2014 1426   MCV 101.3* 02/09/2015 0451   MCV 92 09/14/2014 1426   NEUTROABS 7.8* 02/02/2015 2258   NEUTROABS 3.7 09/14/2014 1426   NEUTROABS 6.4 01/28/2013 1639   LYMPHSABS 0.2* 02/02/2015 2258   LYMPHSABS 0.8* 09/14/2014 1426   LYMPHSABS CANCELED 03/18/2013 1603   MONOABS 0.8 02/02/2015 2258   MONOABS 0.6 09/14/2014 1426   EOSABS 0.0 02/02/2015 2258   EOSABS 0.0 09/14/2014 1426   EOSABS CANCELED 03/18/2013 1603   BASOSABS 0.0 02/02/2015 2258   BASOSABS 0.1 09/14/2014 1426   BASOSABS CANCELED 03/18/2013 1603   Comprehensive Metabolic Panel:    Component Value Date/Time   NA 142 02/09/2015 0451   NA 133* 10/20/2014 1603   NA 131* 04/22/2013 1546   K 3.5 02/09/2015 0451   K 4.0 04/22/2013 1546   CL 109 02/09/2015 0451   CL 100 04/22/2013 1546   CO2 25 02/09/2015 0451   CO2 23 04/22/2013 1546   BUN 34* 02/09/2015 0451   BUN 13 10/20/2014 1603   BUN 21* 04/22/2013 1546   CREATININE 1.31* 02/09/2015 0451   CREATININE 1.34* 09/14/2014 1426   GLUCOSE 198*  02/09/2015 0451   GLUCOSE 99 10/20/2014 1603   GLUCOSE 82 04/22/2013 1546   CALCIUM 7.9* 02/09/2015 0451   CALCIUM 8.0* 09/14/2014 1426   AST 25 02/08/2015 0459   AST 16 09/14/2014 1426   ALT 35 02/08/2015 0459   ALT 15 09/14/2014 1426   ALKPHOS 51 02/08/2015 0459   ALKPHOS 71 09/14/2014 1426   BILITOT 0.8 02/08/2015 0459   PROT 4.3* 02/08/2015 0459   PROT 6.2* 09/14/2014 1426   ALBUMIN 2.0* 02/08/2015 0459   ALBUMIN 3.4 09/14/2014 1426    IMPRESSION:  Ms. Briere is a 74 year old female with a PMH of COPD, asthma, extensive PVD, s/p peripheral artery stent, h/o iliac stents, h/o stomach ulcer, current alcohol abuse, CKD unknown stage, and GERD. Pt presented to D. Schnier's office on 02-01-2015 with LLE pain. Pt was emergently admitted  for L femoral endarectomy. Post surgery pt developed DT's due to extensive alcohol abuse. Pt then developed PNA and was intubated for respiratory failure. Family at bedside.  Pt resting in bed and in NAD.  Pt with agitation last night and will haldol for symptom management.  Vitals signs taken by RN and after my assessment pt appears stable enough for hospice home.  Spoke with Thayer Headings, by phone and he agrees with transfer.  Hospice liason notified.  PLAN: 1. Hospice Home  REFERRALS TO BE ORDERED:  Hospice Liason notified   More than 50% of the visit was spent in counseling/coordination of care: YES  Time Spent:35 minutes

## 2015-02-10 NOTE — Clinical Social Work Note (Signed)
Pt is ready for discharge today at 2 PM to [facility]. CSW prepared packet. Doreatha Lew, RN with [facility], made arrangements for transfer. CSW updated MD, who will put the order in for discharge. CSW is signing off as no further needs identified.   Darden Dates, MSW, LCSW Clinical Social Worker  [phone]

## 2015-02-10 NOTE — Progress Notes (Addendum)
Patient transferred to [place] with EMS. Foley in place. Patient appears comfortable. Madlyn Frankel, RN

## 2015-02-10 NOTE — Progress Notes (Signed)
[name]  Daily Progress Note   Subjective  - 8 Days Post-Op  Patient transferred to the floor for comfort measures. Appears in no distress this morning. Breathing reasonably comfortably. Her VAC dressing is continuously beeping.  Objective Filed Vitals:   02/09/15 1200 02/09/15 1217 02/09/15 1300 02/09/15 1400  BP: 116/49  124/52 117/46  Pulse: 86  91 100  Temp:      TempSrc:      Resp: 18  9 8   Height:      Weight:      SpO2: 98% 97% 98% 98%    Intake/Output Summary (Last 24 hours) at 02/10/15 0848 Last data filed at 02/10/15 0550  Gross per 24 hour  Intake 582.38 ml  Output    825 ml  Net -242.62 ml    PULM  diminished air movement bilaterally CV  RRR VASC  feet warm with good capillary refill  Laboratory CBC    Component Value Date/Time   WBC 7.2 02/09/2015 0451   WBC 5.2 09/14/2014 1426   WBC CANCELED 03/18/2013 1603   HGB 8.3* 02/09/2015 0451   HGB 13.4 09/14/2014 1426   HCT 24.6* 02/09/2015 0451   HCT 45.7 12/08/2014 1310   PLT 167 02/09/2015 0451   PLT 240 09/14/2014 1426    BMET    Component Value Date/Time   NA 142 02/09/2015 0451   NA 133* 10/20/2014 1603   NA 131* 04/22/2013 1546   K 3.5 02/09/2015 0451   K 4.0 04/22/2013 1546   CL 109 02/09/2015 0451   CL 100 04/22/2013 1546   CO2 25 02/09/2015 0451   CO2 23 04/22/2013 1546   GLUCOSE 198* 02/09/2015 0451   GLUCOSE 99 10/20/2014 1603   GLUCOSE 82 04/22/2013 1546   BUN 34* 02/09/2015 0451   BUN 13 10/20/2014 1603   BUN 21* 04/22/2013 1546   CREATININE 1.31* 02/09/2015 0451   CREATININE 1.34* 09/14/2014 1426   CALCIUM 7.9* 02/09/2015 0451   CALCIUM 8.0* 09/14/2014 1426   GFRNONAA 39* 02/09/2015 0451   GFRNONAA 44* 01/12/2014 1420   GFRAA 45* 02/09/2015 0451   GFRAA 51* 01/12/2014 1420    Assessment/Planning: POD # 8 and 9 s/p extensive bilateral lower extremity revascularization.   Severe alcohol abuse and alcohol withdrawal. Family has decided for  comfort measures only at this point. She appears comfortable and not in any pain. Would consider discharge to hospice home if she does not pass in the next 24 hours.    Jennifer Simpson  02/10/2015, 8:48 AM

## 2015-02-10 NOTE — Progress Notes (Signed)
Report called to nurse at hospice home. EMS to arrive for transport at [place], RN

## 2015-02-10 NOTE — Plan of Care (Signed)
Problem: Discharge Progression Outcomes Goal: Discharge plan in place and appropriate Individualization of Care Outcome: Progressing 1. Goes by Jennifer Simpson 2. Pt is comfort care Goal: Other Discharge Outcomes/Goals Outcome: Not Progressing Plan of care progress to goal for: Pain-pt's facial expression indicated pain, prn meds given with some improvment Hemodynamically-pt had increased secretions, prn meds given with some improvement Complications-pt agitated at times, calling out, pulling tearing at dressing on right arm, pulling on clothes, and RIJ, prn ativan given with improvement Diet-pt is comfort care, mouth care preformed  Activity-pt is comfort care

## 2015-02-10 NOTE — Progress Notes (Signed)
Pt moaning and seeming to be in pain.  PRN morphine given.  Pt continued to be very agitated, pulling on dressing on right arm, pulling on clothes, and her RIJ.  Pt also experiencing increased secretions.  Pt was given PRN glycopyrrolate for secretion and PRN ativan, with improvement in agitation.  Will continue to monitor. Clarise Cruz, RN

## 2015-02-10 NOTE — Progress Notes (Signed)
Nutrition Brief Note  Chart reviewed. Pt now comfort care.  No further nutrition interventions warranted at this time.  Please re-consult as needed.   Kerman Passey [place], [place], LDN [phone] Pager

## 2015-02-10 NOTE — Progress Notes (Signed)
New referral for the hospice home on 74yo female who was admitted to [facility] for emergent left femoral endarectomy. Post surgery she developed DT's due to extensive alcohol abuse and developed pneumonia with respiratory distress and was intubated. Decision was made by patients brother to honor her living will and patient was extubated and made comfort care. I met with the patient's brother, Christain [place] who is her HCPOA and discussed hospice home services. He request transition to the hospice home today. Patient is DNR. She is currently on 4 L of oxygen via nasal cannula. Consent signed and RN called report. EMS called to transport patient with pick up time of 1400. All updated information faxed to referral intake.  Thank you for allowing participation in this patient's care.  Dimas Aguas, RN Clinical Nurse Liaison [facility] [phone]

## 2015-02-10 NOTE — Consult Note (Signed)
Hospice Home Discharge Orders    Patient:  Jennifer Simpson is an 74 y.o., female MRN:  [id number] DOB:  1941/01/29 Patient phone:  [phone] (home)  Patient address:   [address] [place] [address],     Admit: Admit to Hospice Home  Code Status: DNR  Diet: as tolerated  Activity: as tolerated  Oxygen: use for comfort  Foley: Leave foley cath for urinary incontinence/previous skin breakdown   Date of Admission:  02/01/2015   Principal Problem: <principal problem not specified> Discharge Diagnoses: Patient Active Problem List   Diagnosis Date Noted  . Pressure ulcer [L89.90] 02/06/2015  . Alcohol withdrawal delirium [F10.231] 02/05/2015  . Ischemic leg [I99.8] 02/02/2015  . Lower extremity arterial insufficiency, severe, left [I73.9] 02/01/2015  . Erythrocytosis [D75.1] 01/15/2015  . Lower leg DVT (deep venous thrombosis) [I82.4Z9] 01/15/2015  . Nausea without vomiting [R11.0] 11/28/2014  . B12 deficiency [E53.8] 11/22/2014  . Protein-calorie malnutrition [E46] 11/22/2014  . COPD, mild [J44.9] 11/22/2014  . GERD (gastroesophageal reflux disease) [K21.9] 05/23/2014  . Left leg DVT [I82.402] 05/23/2014  . Renal artery stenosis [I70.1] 01/04/2014  . Hyperlipidemia [E78.5] 10/23/2012  . PAD (peripheral artery disease) [I73.9] 05/08/2012  . Hypertension [I10]       Medications: May crush or use liquid when appropriate. May change to rectal route if unable to swallow.    Medication List    STOP taking these medications        acetaminophen 500 MG tablet  Commonly known as:  TYLENOL     albuterol-ipratropium 18-103 MCG/ACT inhaler  Commonly known as:  COMBIVENT     calcium carbonate 500 MG chewable tablet  Commonly known as:  TUMS - dosed in mg elemental calcium     cyclobenzaprine 5 MG tablet  Commonly known as:  FLEXERIL     diltiazem 240 MG 24 hr capsule  Commonly known as:  CARDIZEM CD     fluticasone 50 MCG/ACT nasal spray  Commonly known  as:  FLONASE     hydrALAZINE 10 MG tablet  Commonly known as:  APRESOLINE     losartan 100 MG tablet  Commonly known as:  COZAAR     multivitamin-iron-minerals-folic acid chewable tablet     mupirocin ointment 2 %  Commonly known as:  BACTROBAN     pantoprazole 40 MG tablet  Commonly known as:  PROTONIX     promethazine 12.5 MG tablet  Commonly known as:  PHENERGAN     spironolactone 25 MG tablet  Commonly known as:  ALDACTONE     warfarin 2.5 MG tablet  Commonly known as:  COUMADIN     ZANTAC PO      TAKE these medications        glycopyrrolate 2 MG tablet  Commonly known as:  ROBINUL  Take 1 tablet (2 mg total) by mouth every 6 (six) hours as needed (secretions).     haloperidol lactate 5 MG/ML injection  Commonly known as:  HALDOL  Inject 0.2-0.4 mLs (1-2 mg total) into the vein every 6 (six) hours as needed (agitation).     LORazepam 2 MG/ML injection  Commonly known as:  ATIVAN  Inject 0.5-1 mLs (1-2 mg total) into the vein every 2 (two) hours as needed for anxiety.     morphine 4 MG/ML injection  Inject 1 mL (4 mg total) into the vein every 30 (thirty) minutes as needed for severe pain (SOB).           Comments:  may switch any IV/IM medications to PO per hospice home protocol, pt is coming for EOL care, pt went through DT's here earlier in her stay (1.5 bottles of wine daily), pt had agitation last night    Signed: Maudry Mayhew 02/10/2015, 9:41 AM

## 2015-02-13 ENCOUNTER — Ambulatory Visit: Payer: Medicare Other | Admitting: Cardiovascular Disease

## 2015-02-15 ENCOUNTER — Other Ambulatory Visit: Payer: Medicare Other

## 2015-03-01 ENCOUNTER — Other Ambulatory Visit: Payer: Medicare Other

## 2015-03-03 DEATH — deceased

## 2015-03-15 ENCOUNTER — Other Ambulatory Visit: Payer: Medicare Other

## 2015-03-15 ENCOUNTER — Ambulatory Visit: Payer: Medicare Other | Admitting: Internal Medicine

## 2015-04-03 LAB — PROTIME-INR
INR: 1.9
PROTHROMBIN TIME: 21.8 s — AB

## 2016-02-02 ENCOUNTER — Encounter: Payer: Self-pay | Admitting: Vascular Surgery

## 2016-02-21 ENCOUNTER — Other Ambulatory Visit: Payer: Self-pay | Admitting: Nurse Practitioner
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
